# Patient Record
Sex: Female | Born: 1955 | Race: White | Hispanic: No | Smoking: Current every day smoker
Health system: Southern US, Community
[De-identification: ages and names within clinical notes are randomized; demographics above are authoritative.]

## PROBLEM LIST (undated history)

## (undated) DIAGNOSIS — C801 Malignant (primary) neoplasm, unspecified: Secondary | ICD-10-CM

## (undated) DIAGNOSIS — M86172 Other acute osteomyelitis, left ankle and foot: Secondary | ICD-10-CM

## (undated) DIAGNOSIS — M1990 Unspecified osteoarthritis, unspecified site: Secondary | ICD-10-CM

## (undated) DIAGNOSIS — R05 Cough: Secondary | ICD-10-CM

## (undated) DIAGNOSIS — Z87442 Personal history of urinary calculi: Secondary | ICD-10-CM

## (undated) DIAGNOSIS — R059 Cough, unspecified: Secondary | ICD-10-CM

## (undated) HISTORY — PX: BREAST SURGERY: SHX581

## (undated) HISTORY — PX: CHOLECYSTECTOMY: SHX55

---

## 2004-02-14 ENCOUNTER — Emergency Department (HOSPITAL_COMMUNITY): Admission: EM | Admit: 2004-02-14 | Discharge: 2004-02-15 | Payer: Self-pay | Admitting: Emergency Medicine

## 2004-03-31 ENCOUNTER — Emergency Department (HOSPITAL_COMMUNITY): Admission: EM | Admit: 2004-03-31 | Discharge: 2004-03-31 | Payer: Self-pay | Admitting: Emergency Medicine

## 2008-09-24 ENCOUNTER — Emergency Department (HOSPITAL_COMMUNITY): Admission: EM | Admit: 2008-09-24 | Discharge: 2008-09-24 | Payer: Self-pay | Admitting: Emergency Medicine

## 2009-03-13 ENCOUNTER — Emergency Department (HOSPITAL_COMMUNITY): Admission: EM | Admit: 2009-03-13 | Discharge: 2009-03-13 | Payer: Self-pay | Admitting: Emergency Medicine

## 2009-08-08 ENCOUNTER — Emergency Department (HOSPITAL_COMMUNITY): Admission: EM | Admit: 2009-08-08 | Discharge: 2009-08-09 | Payer: Self-pay | Admitting: Emergency Medicine

## 2010-04-25 ENCOUNTER — Emergency Department (HOSPITAL_COMMUNITY): Admission: EM | Admit: 2010-04-25 | Discharge: 2010-04-25 | Payer: Self-pay | Admitting: Emergency Medicine

## 2010-09-17 LAB — GLUCOSE, CAPILLARY: Glucose-Capillary: 339 mg/dL — ABNORMAL HIGH (ref 70–99)

## 2010-09-24 LAB — URINALYSIS, ROUTINE W REFLEX MICROSCOPIC
Bilirubin Urine: NEGATIVE
Glucose, UA: >1000 mg/dL — AB
Hgb urine dipstick: NEGATIVE
Ketones, ur: NEGATIVE mg/dL
Leukocytes, UA: NEGATIVE
Nitrite: NEGATIVE
Protein, ur: NEGATIVE mg/dL
Specific Gravity, Urine: 1.015 (ref 1.005–1.030)
Urobilinogen, UA: 0.2 mg/dL (ref 0.0–1.0)
pH: 5.5 (ref 5.0–8.0)

## 2010-09-24 LAB — URINE MICROSCOPIC-ADD ON

## 2010-09-24 LAB — GLUCOSE, CAPILLARY: Glucose-Capillary: 399 mg/dL — ABNORMAL HIGH (ref 70–99)

## 2010-10-16 LAB — GLUCOSE, CAPILLARY: Glucose-Capillary: 243 mg/dL — ABNORMAL HIGH (ref 70–99)

## 2011-01-06 ENCOUNTER — Emergency Department (HOSPITAL_COMMUNITY)
Admission: EM | Admit: 2011-01-06 | Discharge: 2011-01-06 | Disposition: A | Discharge status: Home / Self Care | Payer: Self-pay | Attending: Emergency Medicine | Admitting: Emergency Medicine

## 2011-01-06 DIAGNOSIS — IMO0002 Reserved for concepts with insufficient information to code with codable children: Secondary | ICD-10-CM | POA: Insufficient documentation

## 2011-01-06 DIAGNOSIS — T169XXA Foreign body in ear, unspecified ear, initial encounter: Secondary | ICD-10-CM | POA: Insufficient documentation

## 2011-01-06 DIAGNOSIS — E119 Type 2 diabetes mellitus without complications: Secondary | ICD-10-CM | POA: Insufficient documentation

## 2011-03-15 ENCOUNTER — Inpatient Hospital Stay (HOSPITAL_COMMUNITY)
Admission: EM | Admit: 2011-03-15 | Discharge: 2011-03-20 | DRG: 470 | Disposition: A | Discharge status: Home Health | Payer: Self-pay | Attending: Orthopaedic Surgery | Admitting: Orthopaedic Surgery

## 2011-03-15 ENCOUNTER — Other Ambulatory Visit: Payer: Self-pay

## 2011-03-15 ENCOUNTER — Emergency Department (HOSPITAL_COMMUNITY): Payer: Self-pay

## 2011-03-15 DIAGNOSIS — E119 Type 2 diabetes mellitus without complications: Secondary | ICD-10-CM | POA: Diagnosis present

## 2011-03-15 DIAGNOSIS — S72009A Fracture of unspecified part of neck of unspecified femur, initial encounter for closed fracture: Secondary | ICD-10-CM

## 2011-03-15 DIAGNOSIS — S72001A Fracture of unspecified part of neck of right femur, initial encounter for closed fracture: Secondary | ICD-10-CM | POA: Diagnosis present

## 2011-03-15 DIAGNOSIS — IMO0001 Reserved for inherently not codable concepts without codable children: Secondary | ICD-10-CM | POA: Diagnosis present

## 2011-03-15 DIAGNOSIS — W010XXA Fall on same level from slipping, tripping and stumbling without subsequent striking against object, initial encounter: Secondary | ICD-10-CM | POA: Diagnosis present

## 2011-03-15 DIAGNOSIS — S72033A Displaced midcervical fracture of unspecified femur, initial encounter for closed fracture: Principal | ICD-10-CM | POA: Diagnosis present

## 2011-03-15 LAB — GLUCOSE, CAPILLARY
Glucose-Capillary: 270 mg/dL — ABNORMAL HIGH (ref 70–99)
Glucose-Capillary: 294 mg/dL — ABNORMAL HIGH (ref 70–99)

## 2011-03-15 LAB — DIFFERENTIAL
Basophils Absolute: 0 10*3/uL (ref 0.0–0.1)
Basophils Relative: 0 % (ref 0–1)
Eosinophils Absolute: 0.1 10*3/uL (ref 0.0–0.7)
Eosinophils Relative: 1 % (ref 0–5)
Lymphocytes Relative: 22 % (ref 12–46)
Lymphs Abs: 2.5 10*3/uL (ref 0.7–4.0)
Monocytes Absolute: 0.5 10*3/uL (ref 0.1–1.0)
Monocytes Relative: 5 % (ref 3–12)
Neutro Abs: 8.2 10*3/uL — ABNORMAL HIGH (ref 1.7–7.7)
Neutrophils Relative %: 73 % (ref 43–77)

## 2011-03-15 LAB — CBC
HCT: 39.1 % (ref 36.0–46.0)
Hemoglobin: 13.6 g/dL (ref 12.0–15.0)
MCH: 29.8 pg (ref 26.0–34.0)
MCHC: 34.8 g/dL (ref 30.0–36.0)
MCV: 85.7 fL (ref 78.0–100.0)
Platelets: 149 10*3/uL — ABNORMAL LOW (ref 150–400)
RBC: 4.56 MIL/uL (ref 3.87–5.11)
RDW: 12.6 % (ref 11.5–15.5)
WBC: 11.2 10*3/uL — ABNORMAL HIGH (ref 4.0–10.5)

## 2011-03-15 LAB — BASIC METABOLIC PANEL
BUN: 17 mg/dL (ref 6–23)
CO2: 25 mEq/L (ref 19–32)
Calcium: 9.2 mg/dL (ref 8.4–10.5)
Chloride: 99 mEq/L (ref 96–112)
Creatinine, Ser: 0.48 mg/dL — ABNORMAL LOW (ref 0.50–1.10)
GFR calc Af Amer: >60 mL/min (ref >60)
GFR calc non Af Amer: >60 mL/min (ref >60)
Glucose, Bld: 280 mg/dL — ABNORMAL HIGH (ref 70–99)
Potassium: 3.4 mEq/L — ABNORMAL LOW (ref 3.5–5.1)
Sodium: 132 mEq/L — ABNORMAL LOW (ref 135–145)

## 2011-03-15 LAB — APTT: aPTT: 29 seconds (ref 24–37)

## 2011-03-15 LAB — PROTIME-INR
INR: 0.98 (ref 0.00–1.49)
Prothrombin Time: 13.2 seconds (ref 11.6–15.2)

## 2011-03-15 LAB — HEMOGLOBIN A1C
Hgb A1c MFr Bld: 10.4 % — ABNORMAL HIGH (ref <5.7)
Mean Plasma Glucose: 252 mg/dL — ABNORMAL HIGH (ref <117)

## 2011-03-15 MED ORDER — INSULIN ASPART 100 UNIT/ML ~~LOC~~ SOLN
0.0000 [IU] | Freq: Every day | SUBCUTANEOUS | Status: DC
  Administered 2011-03-15: 5 [IU] via SUBCUTANEOUS
  Administered 2011-03-17: 2 [IU] via SUBCUTANEOUS
  Administered 2011-03-18 – 2011-03-19 (×2): 3 [IU] via SUBCUTANEOUS

## 2011-03-15 MED ORDER — POTASSIUM CHLORIDE IN NACL 20-0.9 MEQ/L-% IV SOLN
INTRAVENOUS | Status: DC
  Administered 2011-03-15 – 2011-03-16 (×2): via INTRAVENOUS
  Administered 2011-03-17: 1000 mL via INTRAVENOUS
  Administered 2011-03-18: 16:00:00 via INTRAVENOUS

## 2011-03-15 MED ORDER — HYDROMORPHONE HCL 1 MG/ML IJ SOLN: 0.5000 mg | INTRAMUSCULAR | Status: DC | PRN

## 2011-03-15 MED ORDER — DIPHENHYDRAMINE HCL 12.5 MG/5ML PO ELIX
12.5000 mg | ORAL_SOLUTION | Freq: Four times a day (QID) | ORAL | Status: DC | PRN
  Administered 2011-03-17: 12.5 mg via ORAL
  Filled 2011-03-15: qty 5

## 2011-03-15 MED ORDER — DEXTROSE-NACL 5-0.45 % IV SOLN
INTRAVENOUS | Status: DC
  Administered 2011-03-15: 16:00:00 via INTRAVENOUS

## 2011-03-15 MED ORDER — HYDROMORPHONE 0.3 MG/ML IV SOLN
INTRAVENOUS | Status: DC
  Administered 2011-03-15 – 2011-03-16 (×2): via INTRAVENOUS
  Administered 2011-03-17: 1.97 mg via INTRAVENOUS
  Administered 2011-03-17: 3.3 mg via INTRAVENOUS
  Administered 2011-03-18: 12:00:00 via INTRAVENOUS
  Administered 2011-03-19: 0.9 mg via INTRAVENOUS
  Administered 2011-03-19: 1.2 mg via INTRAVENOUS
  Administered 2011-03-19: 1.5 mg via INTRAVENOUS

## 2011-03-15 MED ORDER — PNEUMOCOCCAL VAC POLYVALENT 25 MCG/0.5ML IJ INJ
0.5000 mL | INJECTION | INTRAMUSCULAR | Status: AC
  Administered 2011-03-16: 0.5 mL via INTRAMUSCULAR
  Filled 2011-03-15: qty 0.5

## 2011-03-15 MED ORDER — DIPHENHYDRAMINE HCL 50 MG/ML IJ SOLN
12.5000 mg | Freq: Four times a day (QID) | INTRAMUSCULAR | Status: DC | PRN
  Administered 2011-03-18: 12.5 mg via INTRAVENOUS
  Filled 2011-03-15: qty 1

## 2011-03-15 MED ORDER — HYDROMORPHONE 0.3 MG/ML IV SOLN
INTRAVENOUS | Status: AC
  Filled 2011-03-15: qty 25

## 2011-03-15 MED ORDER — ONDANSETRON HCL 4 MG/2ML IJ SOLN
4.0000 mg | Freq: Four times a day (QID) | INTRAMUSCULAR | Status: DC | PRN
  Administered 2011-03-16 – 2011-03-17 (×2): 4 mg via INTRAVENOUS
  Filled 2011-03-15: qty 2

## 2011-03-15 MED ORDER — CEFAZOLIN SODIUM 1-5 GM-% IV SOLN
1.0000 g | INTRAVENOUS | Status: DC
  Filled 2011-03-15: qty 50

## 2011-03-15 MED ORDER — GLIPIZIDE 5 MG PO TABS
5.0000 mg | ORAL_TABLET | Freq: Every day | ORAL | Status: DC
  Filled 2011-03-15: qty 1

## 2011-03-15 MED ORDER — NALOXONE HCL 0.4 MG/ML IJ SOLN: 0.4000 mg | INTRAMUSCULAR | Status: DC | PRN

## 2011-03-15 MED ORDER — SODIUM CHLORIDE 0.9 % IJ SOLN: 9.0000 mL | INTRAMUSCULAR | Status: DC | PRN

## 2011-03-15 MED ORDER — ZOLPIDEM TARTRATE 5 MG PO TABS
5.0000 mg | ORAL_TABLET | Freq: Every day | ORAL | Status: DC
  Administered 2011-03-16 – 2011-03-19 (×3): 5 mg via ORAL
  Filled 2011-03-15 (×4): qty 1

## 2011-03-15 MED ORDER — FENTANYL CITRATE 0.05 MG/ML IJ SOLN
INTRAMUSCULAR | Status: AC
  Administered 2011-03-15: 50 ug
  Filled 2011-03-15: qty 2

## 2011-03-15 MED ORDER — INSULIN ASPART 100 UNIT/ML ~~LOC~~ SOLN
0.0000 [IU] | Freq: Three times a day (TID) | SUBCUTANEOUS | Status: DC
  Administered 2011-03-16: 8 [IU] via SUBCUTANEOUS
  Administered 2011-03-16: 11 [IU] via SUBCUTANEOUS
  Administered 2011-03-17: 5 [IU] via SUBCUTANEOUS
  Administered 2011-03-17: 8 [IU] via SUBCUTANEOUS
  Administered 2011-03-17 – 2011-03-18 (×2): 5 [IU] via SUBCUTANEOUS
  Administered 2011-03-18: 8 [IU] via SUBCUTANEOUS
  Administered 2011-03-18: 11 [IU] via SUBCUTANEOUS
  Administered 2011-03-19: 5 [IU] via SUBCUTANEOUS
  Administered 2011-03-19: 11 [IU] via SUBCUTANEOUS
  Administered 2011-03-19: 8 [IU] via SUBCUTANEOUS
  Administered 2011-03-20: 5 [IU] via SUBCUTANEOUS
  Administered 2011-03-20: 3 [IU] via SUBCUTANEOUS
  Filled 2011-03-15 (×2): qty 3

## 2011-03-15 MED ORDER — POVIDONE-IODINE 10 % EX SOLN
Freq: Once | CUTANEOUS | Status: AC
  Administered 2011-03-15: 22:00:00 via TOPICAL
  Filled 2011-03-15: qty 711

## 2011-03-15 MED ORDER — HYDROMORPHONE HCL 1 MG/ML IJ SOLN
INTRAMUSCULAR | Status: AC
  Administered 2011-03-15: 1 mg
  Filled 2011-03-15: qty 1

## 2011-03-15 NOTE — H&P (Signed)
Brandy Fisher is an 55 y.o. female.   Chief Complaint: I fell and hurt my right hip. HPI: She fell while getting into a car today in order to go to church this morning.  She hurt her right hip.  She could not stand.  She had severe pain.  X-rays in the ER show a displaced subcapital fracture of the right femur.  She has no other injury.  She has a history of diabetes, oral control but not taking anything now for the diabetes.  She does not have a family doctor now.  She lost her Medicaid and has not seen a physician for her diabetes control.  Past Medical History  Diagnosis Date  . Diabetes mellitus     Past Surgical History  Procedure Date  . Cesarean section   . Breast surgery     Left breast biopsy, negative    Family History  Problem Relation Age of Onset  . Diabetes Mother    Social History:  reports that she has been smoking Cigarettes.  She has a 25 pack-year smoking history. She does not have any smokeless tobacco history on file. She reports that she does not drink alcohol or use illicit drugs.  Allergies:  Allergies  Allergen Reactions  . Morphine And Related     Medications Prior to Admission  Medication Dose Route Frequency Provider Last Rate Last Dose  . fentaNYL (SUBLIMAZE) 0.05 MG/ML injection        50 mcg at 03/15/11 1028  . HYDROmorphone (DILAUDID) 1 MG/ML injection        1 mg at 03/15/11 1110   No current outpatient prescriptions on file as of 03/15/2011.    Results for orders placed during the hospital encounter of 03/15/11 (from the past 48 hour(s))  CBC     Status: Abnormal   Collection Time   03/15/11 11:05 AM      Component Value Range Comment   WBC 11.2 (*) 4.0 - 10.5 (K/uL)    RBC 4.56  3.87 - 5.11 (MIL/uL)    Hemoglobin 13.6  12.0 - 15.0 (g/dL)    HCT 16.1  09.6 - 04.5 (%)    MCV 85.7  78.0 - 100.0 (fL)    MCH 29.8  26.0 - 34.0 (pg)    MCHC 34.8  30.0 - 36.0 (g/dL)    RDW 40.9  81.1 - 91.4 (%)    Platelets 149 (*) 150 - 400 (K/uL)     DIFFERENTIAL     Status: Abnormal   Collection Time   03/15/11 11:05 AM      Component Value Range Comment   Neutrophils Relative 73  43 - 77 (%)    Neutro Abs 8.2 (*) 1.7 - 7.7 (K/uL)    Lymphocytes Relative 22  12 - 46 (%)    Lymphs Abs 2.5  0.7 - 4.0 (K/uL)    Monocytes Relative 5  3 - 12 (%)    Monocytes Absolute 0.5  0.1 - 1.0 (K/uL)    Eosinophils Relative 1  0 - 5 (%)    Eosinophils Absolute 0.1  0.0 - 0.7 (K/uL)    Basophils Relative 0  0 - 1 (%)    Basophils Absolute 0.0  0.0 - 0.1 (K/uL)   BASIC METABOLIC PANEL     Status: Abnormal   Collection Time   03/15/11 11:05 AM      Component Value Range Comment   Sodium 132 (*) 135 - 145 (mEq/L)  Potassium 3.4 (*) 3.5 - 5.1 (mEq/L)    Chloride 99  96 - 112 (mEq/L)    CO2 25  19 - 32 (mEq/L)    Glucose, Bld 280 (*) 70 - 99 (mg/dL)    BUN 17  6 - 23 (mg/dL)    Creatinine, Ser 1.61 (*) 0.50 - 1.10 (mg/dL)    Calcium 9.2  8.4 - 10.5 (mg/dL)    GFR calc non Af Amer >60  >60 (mL/min)    GFR calc Af Amer >60  >60 (mL/min)   PROTIME-INR     Status: Normal   Collection Time   03/15/11 11:05 AM      Component Value Range Comment   Prothrombin Time 13.2  11.6 - 15.2 (seconds)    INR 0.98  0.00 - 1.49    APTT     Status: Normal   Collection Time   03/15/11 11:05 AM      Component Value Range Comment   aPTT 29  24 - 37 (seconds)   TYPE AND SCREEN     Status: Normal   Collection Time   03/15/11 11:10 AM      Component Value Range Comment   ABO/RH(D) A NEG      Antibody Screen NEG      Sample Expiration 03/18/2011      Dg Chest 1 View  03/15/2011  *RADIOLOGY REPORT*  Clinical Data: Preoperative respiratory exam for hip fracture  CHEST - 1 VIEW  Comparison: None.  Findings: Lungs are clear.  No edema or infiltrate.  No pneumothorax.  No pleural fluid.  Heart size is normal.  Visualized bony thorax is unremarkable.  IMPRESSION: No active disease.  Original Report Authenticated By: Reola Calkins, M.D.   Dg Hip Complete  Right  03/15/2011  *RADIOLOGY REPORT*  Clinical Data: Fall with right hip pain.  RIGHT HIP - COMPLETE 2+ VIEW  Comparison: None.  Findings: Complete subcapital hip fracture present with angulation and impaction.  No dislocation.  The rest of the bony pelvis is intact.  Moderate degenerative changes are seen in the visualized left hip joint.  IMPRESSION: Acute right subcapital hip fracture.  Original Report Authenticated By: Reola Calkins, M.D.    Review of Systems  Constitutional: Negative for fever, chills, weight loss, malaise/fatigue and diaphoresis.  HENT: Negative.   Eyes: Negative.   Respiratory: Negative.   Cardiovascular: Negative.   Gastrointestinal: Negative.   Genitourinary: Negative.   Musculoskeletal: Positive for joint pain (right hip pain with the fall today.  She has mild arthritis multiple joints but does not take anything for it.) and falls (today.).  Skin: Negative.   Neurological: Negative.  Negative for weakness.  Endo/Heme/Allergies: Positive for polydipsia (she is positive for diabetes oral control (poor control).).  Psychiatric/Behavioral: Negative.     Blood pressure 143/71, pulse 88, temperature 97.8 F (36.6 C), temperature source Oral, resp. rate 20, height 5\' 7"  (1.702 m), weight 83.915 kg (185 lb), SpO2 93.00%. Physical Exam  Constitutional: She is oriented to person, place, and time. She appears well-developed and well-nourished.  HENT:  Head: Normocephalic.  Eyes: Conjunctivae and EOM are normal. Pupils are equal, round, and reactive to light.  Neck: Normal range of motion. Neck supple.  Cardiovascular: Normal rate, regular rhythm, normal heart sounds and intact distal pulses.   Respiratory: Effort normal and breath sounds normal.  GI: Soft. Bowel sounds are normal.  Musculoskeletal: Tenderness: Pain right hip to any motion.  She is shortened and externally rotated  of the right lower leg.  Neurovascular is intact.       Right hip: She exhibits  decreased range of motion, tenderness, bony tenderness and crepitus.       Legs: Neurological: She is alert and oriented to person, place, and time. She has normal reflexes.  Skin: Skin is warm and dry.  Psychiatric: She has a normal mood and affect. Her behavior is normal. Judgment and thought content normal.     Assessment/Plan Impression:  Acute subcapital fracture of the right hip.  Other problems:  1.  Diabetes mellitus, poorly controlled; 2.  No family physician   Plan:  1.  Inpatient admission.  2.  Have hospitalist consult and evaluate pre-op for possible surgery tomorrow (Monday) and evaluate and manage diabetes during hospital stay.  I have discussed with the patient the findings and need for surgery of the right hip.  Risks and imponderables were discussed in detail including infection, blood clots>pulmonary embolism, possible need for blood transfusion, need for PT/OT, need for a walker, possible nursing home placement and anesthesia risks (spinal preferred) among others.  She asked appropriate questions.  She appears to understand.   Kerby Hockley 03/15/2011, 12:39 PM

## 2011-03-15 NOTE — ED Notes (Signed)
Resting quietly in bed. Family at bedside. No change in pt condition. Continues to await admission.

## 2011-03-15 NOTE — ED Notes (Signed)
Called to give report to nurse on floor. Was told she was unavailable. Awaiting return call.

## 2011-03-15 NOTE — ED Notes (Signed)
Called floor again to give report. Was told remains on lunch. Awaiting return call

## 2011-03-15 NOTE — ED Notes (Signed)
Resting quietly in bed. Reports improvement in pain after dilaudid. Resp even and unlabored. Foley in place. Orthopedic md at bedside.

## 2011-03-15 NOTE — ED Provider Notes (Addendum)
Scribed for [employer]. Bernette Mayers, MD, the patient was seen in room APA18/APA18. This chart was scribed by [employer]. The patient's care started at 10:18  CSN: [id number] Arrival date & time: 03/15/2011 10:20 AM  Chief Complaint  Patient presents with  . Fall   HPI Brandy Fisher is a 55 y.o. female with a history of diabetes mellitus who presents to the Emergency Department complaining of fall. States she was getting into her car this a.m when she lost her balance and fell. Reports pain in her right hip radiating into her leg and knee. Denies any loss of consciousness, head trauma or neck pain.   Past Medical History  Diagnosis Date  . Diabetes mellitus     Past Surgical History  Procedure Date  . Cesarean section     No family history on file.  History  Substance Use Topics  . Smoking status: Current Everyday Smoker  . Smokeless tobacco: Not on file  . Alcohol Use: No    OB History    Grav Para Term Preterm Abortions TAB SAB Ect Mult Living                  Review of Systems  HENT: Negative for neck pain.   Neurological: Negative for headaches.  All other systems reviewed and are negative.    Physical Exam  BP 166/90  Pulse 88  Temp(Src) 97.8 F (36.6 C) (Oral)  Resp 20  Ht 5\' 7"  (1.702 m)  Wt 185 lb (83.915 kg)  BMI 28.97 kg/m2  SpO2 96%  Physical Exam  Nursing note and vitals reviewed. Constitutional: She is oriented to person, place, and time. She appears well-developed and well-nourished.  HENT:  Head: Normocephalic and atraumatic.  Mouth/Throat: No oropharyngeal exudate.  Eyes: Conjunctivae and EOM are normal. Pupils are equal, round, and reactive to light.  Neck: Normal range of motion. Neck supple. No tracheal deviation present.  Cardiovascular: Normal rate, regular rhythm, normal heart sounds and intact distal pulses.   No murmur heard. Pulmonary/Chest: Effort normal and breath sounds normal. No respiratory distress. She has no wheezes. She  has no rales.  Abdominal: Soft. Bowel sounds are normal. There is no tenderness. There is no rebound and no guarding.  Musculoskeletal: She exhibits tenderness. She exhibits no edema.       No tenderness along the C or L-spine; R leg is shortened and externally rotated, pain with ROM, tender over the R hip  Lymphadenopathy:    She has no cervical adenopathy.  Neurological: She is alert and oriented to person, place, and time. No cranial nerve deficit.  Skin: Skin is warm and dry. No rash noted. She is not diaphoretic. No erythema.  Psychiatric: She has a normal mood and affect. Her behavior is normal.    ED Course  Procedures  OTHER DATA REVIEWED: Nursing notes, vital signs, and past medical records reviewed.    DIAGNOSTIC STUDIES: Oxygen Saturation is 96% on room air, normal by my interpretation.    LABS / RADIOLOGY: Xray shows hip fracture.   Results for orders placed during the hospital encounter of 03/15/11  CBC      Component Value Range   WBC 11.2 (*) 4.0 - 10.5 (K/uL)   RBC 4.56  3.87 - 5.11 (MIL/uL)   Hemoglobin 13.6  12.0 - 15.0 (g/dL)   HCT 40.9  81.1 - 91.4 (%)   MCV 85.7  78.0 - 100.0 (fL)   MCH 29.8  26.0 - 34.0 (pg)  MCHC 34.8  30.0 - 36.0 (g/dL)   RDW 87.5  64.3 - 32.9 (%)   Platelets 149 (*) 150 - 400 (K/uL)  DIFFERENTIAL      Component Value Range   Neutrophils Relative 73  43 - 77 (%)   Neutro Abs 8.2 (*) 1.7 - 7.7 (K/uL)   Lymphocytes Relative 22  12 - 46 (%)   Lymphs Abs 2.5  0.7 - 4.0 (K/uL)   Monocytes Relative 5  3 - 12 (%)   Monocytes Absolute 0.5  0.1 - 1.0 (K/uL)   Eosinophils Relative 1  0 - 5 (%)   Eosinophils Absolute 0.1  0.0 - 0.7 (K/uL)   Basophils Relative 0  0 - 1 (%)   Basophils Absolute 0.0  0.0 - 0.1 (K/uL)  PROTIME-INR      Component Value Range   Prothrombin Time 13.2  11.6 - 15.2 (seconds)   INR 0.98  0.00 - 1.49   APTT      Component Value Range   aPTT 29  24 - 37 (seconds)     Dg Chest 1 View  03/15/2011  *RADIOLOGY  REPORT*  Clinical Data: Preoperative respiratory exam for hip fracture  CHEST - 1 VIEW  Comparison: None.  Findings: Lungs are clear.  No edema or infiltrate.  No pneumothorax.  No pleural fluid.  Heart size is normal.  Visualized bony thorax is unremarkable.  IMPRESSION: No active disease.  Original Report Authenticated By: Reola Calkins, M.D.   Dg Hip Complete Right  03/15/2011  *RADIOLOGY REPORT*  Clinical Data: Fall with right hip pain.  RIGHT HIP - COMPLETE 2+ VIEW  Comparison: None.  Findings: Complete subcapital hip fracture present with angulation and impaction.  No dislocation.  The rest of the bony pelvis is intact.  Moderate degenerative changes are seen in the visualized left hip joint.  IMPRESSION: Acute right subcapital hip fracture.  Original Report Authenticated By: Reola Calkins, M.D.    ED COURSE / COORDINATION OF CARE: 10:18 - EDMD examined patient and ordered to following Orders Placed This Encounter  Procedures  . DG Chest 1 View  . DG Hip Complete Right  . CBC  . Differential  . Basic metabolic panel  . Protime-INR  . APTT  . ED EKG  . Type and screen     MDM: Admit to Dr.Keeling for repair of hip fracture. Given dilaudid with good pain control.   IMPRESSION: Hip Fracture    MEDICATIONS GIVEN IN THE E.D.  Medications  fentaNYL (SUBLIMAZE) 0.05 MG/ML injection (not administered)     SCRIBE ATTESTATION: I personally performed the services described in the documentation, which were scribed in my presence. The recorded information has been reviewed and considered.   Kalee Mcclenathan B.       Onur Mori B. Bernette Mayers, MD 03/15/11 1154   Date: 03/15/2011  Rate: 79  Rhythm: normal sinus rhythm  QRS Axis: normal  Intervals: normal  ST/T Wave abnormalities: normal  Conduction Disutrbances:none  Narrative Interpretation:   Old EKG Reviewed: none available    Pearla Mckinny B. Bernette Mayers, MD 03/15/11 1154

## 2011-03-15 NOTE — Consult Note (Signed)
Brandy Fisher MRN: [id number] DOB/AGE: 1956/04/30 55 y.o. Referring Physician:Dr Darreld Mclean Consult date: 03/15/2011 Chief Complaint:  Management of diabetes, uncontrolled. HPI: This 55 year old lady has been admitted to the hospital because she fell and fractured her right hip. Dr. Hilda Lias has requested consultation for management of the patient's uncontrolled diabetes. The patient was diagnosed with diabetes approximately 9 years ago. Unfortunately, because of lack of insurance, she has not followed up with a primary care physician regarding her diabetes. She is on no medications. However, she does check her sugars, she says, twice a day. The sugars are variable. She denies any significant polyuria,, polydipsia, nonaccidental weight loss. She has never been hospitalized with uncontrolled diabetes. She does not know her cholesterol. She has never had coronary artery disease or cerebrovascular disease. She has no history of peripheral vascular disease. Unfortunately, she is a smoker. The patient was admitted today and her sugars are almost 300.    Past medical history: 1. Type II non-insulin-dependent diabetes mellitus.  Past surgical history: 1. Cesarean section. 2. Left breast biopsy, negative.     Family History  Problem Relation Age of Onset  . Diabetes Mother    Family history: Positive for diabetes.  Social history: She is a widower, having lost her husband a few years ago. She smokes approximately 2 packs of cigarettes per month. She lives with her daughter and family. She does not drink alcohol. She is unemployed.  Allergies:  Allergies  Allergen Reactions  . Morphine And Related Nausea And Vomiting         WUJ:WJXBJ from the symptoms mentioned above,there are no other symptoms referable to all systems reviewed.  Physical Exam: Blood pressure 119/71, pulse 77, temperature 97.8 F (36.6 C), temperature source Oral, resp. rate 16, height 5\' 7"  (1.702 m), weight 87  kg (191 lb 12.8 oz), SpO2 96.00%. She looks systemically well. Heart sounds are present and normal without murmurs. Lung fields are clear. There are no carotid bruits. There are no focal neurological signs. She has evidence of right hip fracture clinically.   Basename 03/15/11 1105  WBC 11.2*  NEUTROABS 8.2*  HGB 13.6  HCT 39.1  MCV 85.7  PLT 149*    Basename 03/15/11 1105  NA 132*  K 3.4*  CL 99  CO2 25  GLUCOSE 280*  BUN 17  CREATININE 0.48*  CALCIUM 9.2  MG --  PHOS --     Dg Chest 1 View  03/15/2011  *RADIOLOGY REPORT*  Clinical Data: Preoperative respiratory exam for hip fracture  CHEST - 1 VIEW  Comparison: None.  Findings: Lungs are clear.  No edema or infiltrate.  No pneumothorax.  No pleural fluid.  Heart size is normal.  Visualized bony thorax is unremarkable.  IMPRESSION: No active disease.  Original Report Authenticated By: Reola Calkins, M.D.   Dg Hip Complete Right  03/15/2011  *RADIOLOGY REPORT*  Clinical Data: Fall with right hip pain.  RIGHT HIP - COMPLETE 2+ VIEW  Comparison: None.  Findings: Complete subcapital hip fracture present with angulation and impaction.  No dislocation.  The rest of the bony pelvis is intact.  Moderate degenerative changes are seen in the visualized left hip joint.  IMPRESSION: Acute right subcapital hip fracture.  Original Report Authenticated By: Reola Calkins, M.D.   Impression: 1. Uncontrolled type 2 diabetes mellitus. 2. Right subcapital hip fracture.     Plan: 1. Start oral hypoglycemic agents. 2. Sliding scale of insulin. 3. Lipid panel. 4. ECG. I will  follow with you during hospitalization.      Xuan Mateus C 03/15/2011, 5:22 PM

## 2011-03-15 NOTE — ED Notes (Signed)
Pt says was getting into car this am and lost her balance and fell.  Denies any LOC, denies any dizziness or weakness.  Pt c/o R hip pain.    Pedal pulse present, extremity warm to touch  Pt able to wiggle toes.

## 2011-03-16 ENCOUNTER — Other Ambulatory Visit: Payer: Self-pay | Admitting: Orthopaedic Surgery

## 2011-03-16 ENCOUNTER — Encounter (HOSPITAL_COMMUNITY): Payer: Self-pay | Admitting: Anesthesiology

## 2011-03-16 ENCOUNTER — Encounter (HOSPITAL_COMMUNITY): Payer: Self-pay | Admitting: *Deleted

## 2011-03-16 ENCOUNTER — Inpatient Hospital Stay (HOSPITAL_COMMUNITY): Payer: Self-pay | Admitting: Anesthesiology

## 2011-03-16 ENCOUNTER — Inpatient Hospital Stay (HOSPITAL_COMMUNITY): Payer: Self-pay

## 2011-03-16 ENCOUNTER — Encounter (HOSPITAL_COMMUNITY)
Admission: EM | Disposition: A | Discharge status: Home Health | Payer: Self-pay | Source: Home / Self Care | Attending: Orthopaedic Surgery

## 2011-03-16 HISTORY — PX: HIP ARTHROPLASTY: SHX981

## 2011-03-16 LAB — GLUCOSE, CAPILLARY
Glucose-Capillary: 269 mg/dL — ABNORMAL HIGH (ref 70–99)
Glucose-Capillary: 302 mg/dL — ABNORMAL HIGH (ref 70–99)
Glucose-Capillary: 297 mg/dL — ABNORMAL HIGH (ref 70–99)
Glucose-Capillary: 345 mg/dL — ABNORMAL HIGH (ref 70–99)
Glucose-Capillary: 298 mg/dL — ABNORMAL HIGH (ref 70–99)

## 2011-03-16 LAB — CBC
HCT: 38.6 % (ref 36.0–46.0)
Hemoglobin: 13.5 g/dL (ref 12.0–15.0)
MCH: 30.2 pg (ref 26.0–34.0)
MCHC: 35 g/dL (ref 30.0–36.0)
MCV: 86.4 fL (ref 78.0–100.0)
Platelets: 142 10*3/uL — ABNORMAL LOW (ref 150–400)
RBC: 4.47 MIL/uL (ref 3.87–5.11)
RDW: 12.5 % (ref 11.5–15.5)
WBC: 11.9 10*3/uL — ABNORMAL HIGH (ref 4.0–10.5)

## 2011-03-16 LAB — DIFFERENTIAL
Basophils Absolute: 0 10*3/uL (ref 0.0–0.1)
Basophils Relative: 0 % (ref 0–1)
Eosinophils Absolute: 0.1 10*3/uL (ref 0.0–0.7)
Eosinophils Relative: 1 % (ref 0–5)
Lymphocytes Relative: 26 % (ref 12–46)
Lymphs Abs: 3.1 10*3/uL (ref 0.7–4.0)
Monocytes Absolute: 0.7 10*3/uL (ref 0.1–1.0)
Monocytes Relative: 6 % (ref 3–12)
Neutro Abs: 8 10*3/uL — ABNORMAL HIGH (ref 1.7–7.7)
Neutrophils Relative %: 67 % (ref 43–77)

## 2011-03-16 LAB — PREPARE RBC (CROSSMATCH)

## 2011-03-16 LAB — LIPID PANEL
Cholesterol: 136 mg/dL (ref 0–200)
HDL: 31 mg/dL — ABNORMAL LOW (ref >39)
LDL Cholesterol: 60 mg/dL (ref 0–99)
Total CHOL/HDL Ratio: 4.4 RATIO
Triglycerides: 224 mg/dL — ABNORMAL HIGH (ref <150)
VLDL: 45 mg/dL — ABNORMAL HIGH (ref 0–40)

## 2011-03-16 LAB — BASIC METABOLIC PANEL
BUN: 10 mg/dL (ref 6–23)
CO2: 29 mEq/L (ref 19–32)
Calcium: 9 mg/dL (ref 8.4–10.5)
Chloride: 98 mEq/L (ref 96–112)
Creatinine, Ser: 0.49 mg/dL — ABNORMAL LOW (ref 0.50–1.10)
GFR calc Af Amer: >60 mL/min (ref >60)
GFR calc non Af Amer: >60 mL/min (ref >60)
Glucose, Bld: 273 mg/dL — ABNORMAL HIGH (ref 70–99)
Potassium: 3.4 mEq/L — ABNORMAL LOW (ref 3.5–5.1)
Sodium: 134 mEq/L — ABNORMAL LOW (ref 135–145)

## 2011-03-16 LAB — ABO/RH: ABO/RH(D): A NEG

## 2011-03-16 SURGERY — HEMIARTHROPLASTY, HIP, DIRECT ANTERIOR APPROACH, FOR FRACTURE
Anesthesia: Spinal | Laterality: Right | Wound class: Clean

## 2011-03-16 MED ORDER — ACETAMINOPHEN 325 MG PO TABS: 650.0000 mg | ORAL_TABLET | Freq: Four times a day (QID) | ORAL | Status: DC | PRN

## 2011-03-16 MED ORDER — SODIUM CHLORIDE 0.9 % IR SOLN
Status: DC | PRN
  Administered 2011-03-16: 1000 mL

## 2011-03-16 MED ORDER — HYDROGEN PEROXIDE 3 % EX SOLN
CUTANEOUS | Status: DC | PRN
  Administered 2011-03-16: 1

## 2011-03-16 MED ORDER — LIDOCAINE HCL (PF) 1 % IJ SOLN
INTRAMUSCULAR | Status: AC
  Filled 2011-03-16: qty 5

## 2011-03-16 MED ORDER — INSULIN ASPART 100 UNIT/ML ~~LOC~~ SOLN
6.0000 [IU] | Freq: Once | SUBCUTANEOUS | Status: AC
  Administered 2011-03-16: 6 [IU] via SUBCUTANEOUS

## 2011-03-16 MED ORDER — EPHEDRINE SULFATE 50 MG/ML IJ SOLN
INTRAMUSCULAR | Status: DC | PRN
  Administered 2011-03-16 (×3): 10 mg via INTRAVENOUS
  Administered 2011-03-16: 5 mg via INTRAVENOUS
  Administered 2011-03-16: 15 mg via INTRAVENOUS

## 2011-03-16 MED ORDER — GLIPIZIDE 5 MG PO TABS
5.0000 mg | ORAL_TABLET | Freq: Two times a day (BID) | ORAL | Status: DC
  Administered 2011-03-16 – 2011-03-17 (×2): 5 mg via ORAL
  Filled 2011-03-16 (×2): qty 1

## 2011-03-16 MED ORDER — HYDROMORPHONE 0.3 MG/ML IV SOLN
INTRAVENOUS | Status: AC
  Filled 2011-03-16: qty 25

## 2011-03-16 MED ORDER — MIDAZOLAM HCL 2 MG/2ML IJ SOLN
INTRAMUSCULAR | Status: AC
  Filled 2011-03-16: qty 2

## 2011-03-16 MED ORDER — MAGNESIUM HYDROXIDE 400 MG/5ML PO SUSP: 30.0000 mL | Freq: Every day | ORAL | Status: DC | PRN

## 2011-03-16 MED ORDER — PROPOFOL 10 MG/ML IV EMUL
INTRAVENOUS | Status: DC | PRN
  Administered 2011-03-16: 25 ug/kg/min via INTRAVENOUS

## 2011-03-16 MED ORDER — ONDANSETRON HCL 4 MG/2ML IJ SOLN: 4.0000 mg | Freq: Once | INTRAMUSCULAR | Status: AC | PRN

## 2011-03-16 MED ORDER — PROPOFOL 10 MG/ML IV EMUL
INTRAVENOUS | Status: AC
  Filled 2011-03-16: qty 20

## 2011-03-16 MED ORDER — EPHEDRINE SULFATE 50 MG/ML IJ SOLN
INTRAMUSCULAR | Status: AC
  Filled 2011-03-16: qty 1

## 2011-03-16 MED ORDER — ALBUTEROL SULFATE (2.5 MG/3ML) 0.083% IN NEBU
2.5000 mg | INHALATION_SOLUTION | Freq: Four times a day (QID) | RESPIRATORY_TRACT | Status: DC
  Administered 2011-03-16 – 2011-03-19 (×10): 2.5 mg via RESPIRATORY_TRACT
  Filled 2011-03-16 (×11): qty 0.5

## 2011-03-16 MED ORDER — ALBUTEROL SULFATE (5 MG/ML) 0.5% IN NEBU: 2.5000 mg | INHALATION_SOLUTION | RESPIRATORY_TRACT | Status: DC | PRN

## 2011-03-16 MED ORDER — FENTANYL CITRATE 0.05 MG/ML IJ SOLN
INTRAMUSCULAR | Status: DC | PRN
  Administered 2011-03-16: 25 ug via INTRATHECAL

## 2011-03-16 MED ORDER — BUPIVACAINE IN DEXTROSE 0.75-8.25 % IT SOLN
INTRATHECAL | Status: DC | PRN
  Administered 2011-03-16: 15 mg via INTRATHECAL

## 2011-03-16 MED ORDER — CEFAZOLIN SODIUM 1-5 GM-% IV SOLN
INTRAVENOUS | Status: DC | PRN
  Administered 2011-03-16: 1 g via INTRAVENOUS

## 2011-03-16 MED ORDER — FENTANYL CITRATE 0.05 MG/ML IJ SOLN
INTRAMUSCULAR | Status: AC
  Filled 2011-03-16: qty 2

## 2011-03-16 MED ORDER — FENTANYL CITRATE 0.05 MG/ML IJ SOLN
25.0000 ug | INTRAMUSCULAR | Status: DC | PRN
  Administered 2011-03-19: 50 ug via INTRAVENOUS
  Filled 2011-03-16: qty 2

## 2011-03-16 MED ORDER — ONDANSETRON HCL 4 MG/2ML IJ SOLN
INTRAMUSCULAR | Status: AC
  Administered 2011-03-16: 4 mg via INTRAVENOUS
  Filled 2011-03-16: qty 2

## 2011-03-16 MED ORDER — BUPIVACAINE IN DEXTROSE 0.75-8.25 % IT SOLN
INTRATHECAL | Status: AC
  Filled 2011-03-16: qty 2

## 2011-03-16 MED ORDER — FENTANYL CITRATE 0.05 MG/ML IJ SOLN
INTRAMUSCULAR | Status: DC | PRN
  Administered 2011-03-16: 75 ug via INTRAVENOUS

## 2011-03-16 MED ORDER — RIVAROXABAN 10 MG PO TABS
10.0000 mg | ORAL_TABLET | Freq: Every day | ORAL | Status: DC
  Administered 2011-03-17 – 2011-03-20 (×4): 10 mg via ORAL
  Filled 2011-03-16 (×4): qty 1

## 2011-03-16 MED ORDER — LACTATED RINGERS IV SOLN
INTRAVENOUS | Status: DC
  Administered 2011-03-16: 150 mL/h via INTRAVENOUS

## 2011-03-16 MED ORDER — PROMETHAZINE HCL 25 MG/ML IJ SOLN: 12.5000 mg | INTRAMUSCULAR | Status: DC | PRN

## 2011-03-16 MED ORDER — MIDAZOLAM HCL 2 MG/2ML IJ SOLN
1.0000 mg | INTRAMUSCULAR | Status: DC | PRN
  Administered 2011-03-16: 2 mg via INTRAVENOUS

## 2011-03-16 MED ORDER — ZOLPIDEM TARTRATE 5 MG PO TABS
5.0000 mg | ORAL_TABLET | Freq: Every day | ORAL | Status: DC
  Administered 2011-03-16 – 2011-03-17 (×2): 5 mg via ORAL
  Filled 2011-03-16: qty 1

## 2011-03-16 MED ORDER — LACTATED RINGERS IV SOLN
INTRAVENOUS | Status: DC | PRN
  Administered 2011-03-16 (×2): via INTRAVENOUS

## 2011-03-16 MED ORDER — FENTANYL CITRATE 0.05 MG/ML IJ SOLN
INTRAMUSCULAR | Status: AC
  Filled 2011-03-16: qty 5

## 2011-03-16 MED ORDER — INSULIN REGULAR HUMAN 100 UNIT/ML IJ SOLN: 6.0000 [IU] | Freq: Once | INTRAMUSCULAR | Status: DC

## 2011-03-16 MED ORDER — MIDAZOLAM HCL 5 MG/5ML IJ SOLN
INTRAMUSCULAR | Status: DC | PRN
  Administered 2011-03-16: 2 mg via INTRAVENOUS

## 2011-03-16 SURGICAL SUPPLY — 52 items
BAG HAMPER (MISCELLANEOUS) ×2 IMPLANT
BLADE SAGITTAL 25.0X1.27X90 (BLADE) ×2 IMPLANT
BLADE SURG SZ20 CARB STEEL (BLADE) ×2 IMPLANT
BNDG COHESIVE 4X5 TAN NS LF (GAUZE/BANDAGES/DRESSINGS) ×2 IMPLANT
CLOTH BEACON ORANGE TIMEOUT ST (SAFETY) ×2 IMPLANT
COVER LIGHT HANDLE STERIS (MISCELLANEOUS) ×4 IMPLANT
COVER MAYO STAND XLG (DRAPE) ×4 IMPLANT
COVER X RAY CASSETTE (MISCELLANEOUS) ×2 IMPLANT
DRAIN PENROSE 18X1/2 LTX STRL (DRAIN) ×2 IMPLANT
DRAPE ORTHO 2.5IN SPLIT 77X108 (DRAPES) ×1 IMPLANT
DRAPE ORTHO SPLIT 77X108 STRL (DRAPES) ×2
DRAPE PROXIMA HALF (DRAPES) ×2 IMPLANT
EVACUATOR 3/16  PVC DRAIN (DRAIN) ×1
EVACUATOR 3/16 PVC DRAIN (DRAIN) ×1 IMPLANT
FACESHIELD LNG OPTICON STERILE (SAFETY) ×2 IMPLANT
FORMALIN 10 PREFIL 480ML (MISCELLANEOUS) ×2 IMPLANT
GAUZE XEROFORM 5X9 LF (GAUZE/BANDAGES/DRESSINGS) ×2 IMPLANT
GLOVE BIO SURGEON STRL SZ8 (GLOVE) ×2 IMPLANT
GLOVE BIO SURGEON STRL SZ8.5 (GLOVE) ×2 IMPLANT
GLOVE ECLIPSE 6.5 STRL STRAW (GLOVE) ×2 IMPLANT
GLOVE INDICATOR 7.0 STRL GRN (GLOVE) ×2 IMPLANT
GOWN BRE IMP SLV AUR XL STRL (GOWN DISPOSABLE) ×4 IMPLANT
GOWN STRL REIN XL XLG (GOWN DISPOSABLE) ×2 IMPLANT
HANDPIECE INTERPULSE COAX TIP (DISPOSABLE)
INST SET MAJOR BONE (KITS) ×2 IMPLANT
IV NS IRRIG 3000ML ARTHROMATIC (IV SOLUTION) IMPLANT
KIT BLADEGUARD II DBL (SET/KITS/TRAYS/PACK) ×2 IMPLANT
KIT ROOM TURNOVER APOR (KITS) ×2 IMPLANT
MANIFOLD NEPTUNE II (INSTRUMENTS) ×2 IMPLANT
MARKER SKIN DUAL TIP RULER LAB (MISCELLANEOUS) ×2 IMPLANT
NS IRRIG 1000ML POUR BTL (IV SOLUTION) ×2 IMPLANT
PACK TOTAL JOINT (CUSTOM PROCEDURE TRAY) ×2 IMPLANT
PAD ABD 5X9 TENDERSORB (GAUZE/BANDAGES/DRESSINGS) ×3 IMPLANT
PAD ARMBOARD 7.5X6 YLW CONV (MISCELLANEOUS) ×2 IMPLANT
PILLOW HIP ABDUCTION LRG (ORTHOPEDIC SUPPLIES) IMPLANT
PILLOW HIP ABDUCTION MED (ORTHOPEDIC SUPPLIES) ×1 IMPLANT
SET BASIN LINEN APH (SET/KITS/TRAYS/PACK) ×2 IMPLANT
SET HNDPC FAN SPRY TIP SCT (DISPOSABLE) IMPLANT
SOL PREP PROV IODINE SCRUB 4OZ (MISCELLANEOUS) ×2 IMPLANT
SPONGE DRAIN TRACH 4X4 STRL 2S (GAUZE/BANDAGES/DRESSINGS) ×2 IMPLANT
SPONGE GAUZE 4X4 12PLY (GAUZE/BANDAGES/DRESSINGS) ×2 IMPLANT
STAPLER VISISTAT 35W (STAPLE) ×2 IMPLANT
SUT BRALON NAB BRD #1 30IN (SUTURE) ×7 IMPLANT
SUT CHROMIC 1 CTX 36 (SUTURE) ×8 IMPLANT
SUT PLAIN 2 0 XLH (SUTURE) ×7 IMPLANT
SUT SILK 0 FSL (SUTURE) ×2 IMPLANT
SWAB CULTURE LIQ STUART DBL (MISCELLANEOUS) ×2 IMPLANT
TAPE MEDIFIX FOAM 3 (GAUZE/BANDAGES/DRESSINGS) ×2 IMPLANT
TOWER CARTRIDGE SMART MIX (DISPOSABLE) IMPLANT
TRAY FOLEY CATH 14FR (SET/KITS/TRAYS/PACK) ×1 IMPLANT
WATER STERILE IRR 1000ML POUR (IV SOLUTION) ×4 IMPLANT
YANKAUER SUCT 12FT TUBE ARGYLE (SUCTIONS) ×2 IMPLANT

## 2011-03-16 NOTE — Anesthesia Preprocedure Evaluation (Addendum)
Anesthesia Evaluation  Name, MR# and DOB Patient awake  General Assessment Comment  Reviewed: Allergy & Precautions, H&P , NPO status , Patient's Chart, lab work & pertinent test results  Airway Mallampati: II  Neck ROM: Full    Dental  (+) Partial Upper and Partial Lower   Pulmonary  COPD (smoker, am cough)      Cardiovascular Regular Normal    Neuro/Psych Negative Neurological ROS    GI/Hepatic/Renal negative GI ROS            Endo/Other  (+) Diabetes mellitus- (CBG= 297), Poorly Controlled, Type 2, Oral Hypoglycemic Agents,     Abdominal (+) obese,   Musculoskeletal   Hematology   Peds  Reproductive/Obstetrics    Anesthesia Other Findings             Anesthesia Physical Anesthesia Plan  ASA: III  Anesthesia Plan: Spinal   Post-op Pain Management:    Induction:   Airway Management Planned: Nasal Cannula  Additional Equipment:   Intra-op Plan:   Post-operative Plan:   Informed Consent: I have reviewed the patients History and Physical, chart, labs and discussed the procedure including the risks, benefits and alternatives for the proposed anesthesia with the patient or authorized representative who has indicated his/her understanding and acceptance.     Plan Discussed with:   Anesthesia Plan Comments:         Anesthesia Quick Evaluation

## 2011-03-16 NOTE — Progress Notes (Signed)
Subjective: This lady came in with hip fracture and uncontrolled diabetes. She has been started on glipizide 5 mg daily. Her CBGs are still elevated. She is due to have hip surgery this morning or early afternoon. She is n.p.o.           Physical Exam: Blood pressure 125/72, pulse 86, temperature 99.5 F (37.5 C), temperature source Oral, resp. rate 18, height 5\' 7"  (1.702 m), weight 87 kg (191 lb 12.8 oz), SpO2 94.00%. She looks systemically well. There are no new physical findings.   Investigations: Results for orders placed during the hospital encounter of 03/15/11 (from the past 48 hour(s))  HEMOGLOBIN A1C     Status: Abnormal   Collection Time   03/15/11 11:00 AM      Component Value Range Comment   Hemoglobin A1C 10.4 (*) <5.7 (%)    Mean Plasma Glucose 252 (*) <117 (mg/dL)   CBC     Status: Abnormal   Collection Time   03/15/11 11:05 AM      Component Value Range Comment   WBC 11.2 (*) 4.0 - 10.5 (K/uL)    RBC 4.56  3.87 - 5.11 (MIL/uL)    Hemoglobin 13.6  12.0 - 15.0 (g/dL)    HCT 16.1  09.6 - 04.5 (%)    MCV 85.7  78.0 - 100.0 (fL)    MCH 29.8  26.0 - 34.0 (pg)    MCHC 34.8  30.0 - 36.0 (g/dL)    RDW 40.9  81.1 - 91.4 (%)    Platelets 149 (*) 150 - 400 (K/uL)   DIFFERENTIAL     Status: Abnormal   Collection Time   03/15/11 11:05 AM      Component Value Range Comment   Neutrophils Relative 73  43 - 77 (%)    Neutro Abs 8.2 (*) 1.7 - 7.7 (K/uL)    Lymphocytes Relative 22  12 - 46 (%)    Lymphs Abs 2.5  0.7 - 4.0 (K/uL)    Monocytes Relative 5  3 - 12 (%)    Monocytes Absolute 0.5  0.1 - 1.0 (K/uL)    Eosinophils Relative 1  0 - 5 (%)    Eosinophils Absolute 0.1  0.0 - 0.7 (K/uL)    Basophils Relative 0  0 - 1 (%)    Basophils Absolute 0.0  0.0 - 0.1 (K/uL)   BASIC METABOLIC PANEL     Status: Abnormal   Collection Time   03/15/11 11:05 AM      Component Value Range Comment   Sodium 132 (*) 135 - 145 (mEq/L)    Potassium 3.4 (*) 3.5 - 5.1 (mEq/L)    Chloride 99   96 - 112 (mEq/L)    CO2 25  19 - 32 (mEq/L)    Glucose, Bld 280 (*) 70 - 99 (mg/dL)    BUN 17  6 - 23 (mg/dL)    Creatinine, Ser 7.82 (*) 0.50 - 1.10 (mg/dL)    Calcium 9.2  8.4 - 10.5 (mg/dL)    GFR calc non Af Amer >60  >60 (mL/min)    GFR calc Af Amer >60  >60 (mL/min)   PROTIME-INR     Status: Normal   Collection Time   03/15/11 11:05 AM      Component Value Range Comment   Prothrombin Time 13.2  11.6 - 15.2 (seconds)    INR 0.98  0.00 - 1.49    APTT     Status: Normal   Collection  Time   03/15/11 11:05 AM      Component Value Range Comment   aPTT 29  24 - 37 (seconds)   TYPE AND SCREEN     Status: Normal (Preliminary result)   Collection Time   03/15/11 11:10 AM      Component Value Range Comment   ABO/RH(D) A NEG      Antibody Screen NEG      Sample Expiration 03/18/2011      Unit Number [id number]      Blood Component Type RED CELLS,LR      Unit division 00      Status of Unit ALLOCATED      Transfusion Status OK TO TRANSFUSE      Crossmatch Result Compatible      Unit Number [id number]      Blood Component Type RED CELLS,LR      Unit division 00      Status of Unit ALLOCATED      Transfusion Status OK TO TRANSFUSE      Crossmatch Result Compatible     ABO/RH     Status: Normal   Collection Time   03/15/11 11:15 AM      Component Value Range Comment   ABO/RH(D) A NEG     GLUCOSE, CAPILLARY     Status: Abnormal   Collection Time   03/15/11  4:23 PM      Component Value Range Comment   Glucose-Capillary 270 (*) 70 - 99 (mg/dL)    Comment 1 Notify RN      Comment 2 Documented in Chart     GLUCOSE, CAPILLARY     Status: Abnormal   Collection Time   03/15/11  8:39 PM      Component Value Range Comment   Glucose-Capillary 294 (*) 70 - 99 (mg/dL)    Comment 1 Notify RN      Comment 2 Documented in Chart     PREPARE RBC (CROSSMATCH)     Status: Normal   Collection Time   03/15/11 11:41 PM      Component Value Range Comment   Order Confirmation ORDER PROCESSED BY BLOOD  BANK     CBC     Status: Abnormal   Collection Time   03/16/11  5:47 AM      Component Value Range Comment   WBC 11.9 (*) 4.0 - 10.5 (K/uL)    RBC 4.47  3.87 - 5.11 (MIL/uL)    Hemoglobin 13.5  12.0 - 15.0 (g/dL)    HCT 78.2  95.6 - 21.3 (%)    MCV 86.4  78.0 - 100.0 (fL)    MCH 30.2  26.0 - 34.0 (pg)    MCHC 35.0  30.0 - 36.0 (g/dL)    RDW 08.6  57.8 - 46.9 (%)    Platelets 142 (*) 150 - 400 (K/uL)   DIFFERENTIAL     Status: Abnormal   Collection Time   03/16/11  5:47 AM      Component Value Range Comment   Neutrophils Relative 67  43 - 77 (%)    Neutro Abs 8.0 (*) 1.7 - 7.7 (K/uL)    Lymphocytes Relative 26  12 - 46 (%)    Lymphs Abs 3.1  0.7 - 4.0 (K/uL)    Monocytes Relative 6  3 - 12 (%)    Monocytes Absolute 0.7  0.1 - 1.0 (K/uL)    Eosinophils Relative 1  0 - 5 (%)    Eosinophils Absolute 0.1  0.0 - 0.7 (K/uL)    Basophils Relative 0  0 - 1 (%)    Basophils Absolute 0.0  0.0 - 0.1 (K/uL)   BASIC METABOLIC PANEL     Status: Abnormal   Collection Time   03/16/11  5:47 AM      Component Value Range Comment   Sodium 134 (*) 135 - 145 (mEq/L)    Potassium 3.4 (*) 3.5 - 5.1 (mEq/L)    Chloride 98  96 - 112 (mEq/L)    CO2 29  19 - 32 (mEq/L)    Glucose, Bld 273 (*) 70 - 99 (mg/dL)    BUN 10  6 - 23 (mg/dL)    Creatinine, Ser 4.78 (*) 0.50 - 1.10 (mg/dL)    Calcium 9.0  8.4 - 10.5 (mg/dL)    GFR calc non Af Amer >60  >60 (mL/min)    GFR calc Af Amer >60  >60 (mL/min)   LIPID PANEL     Status: Abnormal   Collection Time   03/16/11  5:47 AM      Component Value Range Comment   Cholesterol 136  0 - 200 (mg/dL)    Triglycerides 295 (*) <150 (mg/dL)    HDL 31 (*) >62 (mg/dL)    Total CHOL/HDL Ratio 4.4      VLDL 45 (*) 0 - 40 (mg/dL)    LDL Cholesterol 60  0 - 99 (mg/dL)   GLUCOSE, CAPILLARY     Status: Abnormal   Collection Time   03/16/11  7:38 AM      Component Value Range Comment   Glucose-Capillary 269 (*) 70 - 99 (mg/dL)    Comment 1 Notify RN     GLUCOSE,  CAPILLARY     Status: Abnormal   Collection Time   03/16/11  8:10 AM      Component Value Range Comment   Glucose-Capillary 302 (*) 70 - 99 (mg/dL)    Comment 1 Notify RN      No results found for this or any previous visit (from the past 240 hour(s)).  Dg Chest 1 View  03/15/2011  *RADIOLOGY REPORT*  Clinical Data: Preoperative respiratory exam for hip fracture  CHEST - 1 VIEW  Comparison: None.  Findings: Lungs are clear.  No edema or infiltrate.  No pneumothorax.  No pleural fluid.  Heart size is normal.  Visualized bony thorax is unremarkable.  IMPRESSION: No active disease.  Original Report Authenticated By: Reola Calkins, M.D.   Dg Hip Complete Right  03/15/2011  *RADIOLOGY REPORT*  Clinical Data: Fall with right hip pain.  RIGHT HIP - COMPLETE 2+ VIEW  Comparison: None.  Findings: Complete subcapital hip fracture present with angulation and impaction.  No dislocation.  The rest of the bony pelvis is intact.  Moderate degenerative changes are seen in the visualized left hip joint.  IMPRESSION: Acute right subcapital hip fracture.  Original Report Authenticated By: Reola Calkins, M.D.      Medications: I have reviewed the patient's current medications.  Impression: 1. Uncontrolled type 2 diabetes mellitus.     Plan: 1. Increase glipizide to 5 mg twice a day and continue with sliding scale of insulin.     LOS: 1 day   Prachi Oftedahl C 03/16/2011, 10:02 AM

## 2011-03-16 NOTE — Transfer of Care (Signed)
Immediate Anesthesia Transfer of Care Note  Patient: Brandy Fisher  Procedure(s) Performed:  ARTHROPLASTY BIPOLAR HIP  Patient Location: PACU  Anesthesia Type: Spinal  Level of Consciousness: awake, alert , oriented and patient cooperative  Airway & Oxygen Therapy: Patient Spontanous Breathing and Patient connected to face mask oxygen  Post-op Assessment: Report given to PACU RN and Post -op Vital signs reviewed and stable  Post vital signs: Reviewed and stable  Complications: No apparent anesthesia complications

## 2011-03-16 NOTE — Anesthesia Postprocedure Evaluation (Addendum)
  Anesthesia Post-op Note  Patient: Brandy Fisher  Procedure(s) Performed:  ARTHROPLASTY BIPOLAR HIP  Patient Location: PACU  Anesthesia Type: Spinal  Level of Consciousness: awake  Airway and Oxygen Therapy: Patient Spontanous Breathing and Patient connected to nasal cannula oxygen  Post-op Pain: none  Post-op Assessment: Post-op Vital signs reviewed, Patient's Cardiovascular Status Stable, Respiratory Function Stable and No signs of Nausea or vomiting  Post-op Vital Signs: stable  Complications: No apparent anesthesia complications   03-17-11 1138  Patient seen.  Patient reports nausea.  No anesthetic complications noted.

## 2011-03-16 NOTE — Brief Op Note (Signed)
03/15/2011 - 03/16/2011  2:55 PM  PATIENT:  Brandy Fisher  55 y.o. female  PRE-OPERATIVE DIAGNOSIS:  fx right hip  POST-OPERATIVE DIAGNOSIS:  fx right hip  PROCEDURE:  Procedure(s): ARTHROPLASTY BIPOLAR HIP  SURGEON:  Surgeon(s): [employer]  PHYSICIAN ASSISTANT:   ASSISTANTS: none   ANESTHESIA:   spinal  OR FLUID I/O:  Total I/O In: 100 [I.V.:100] Out: 1450 [Urine:1325; Blood:125]  BLOOD ADMINISTERED:none  DRAINS: Large Hemovac, sown in   LOCAL MEDICATIONS USED:  NONE  SPECIMEN:  Source of Specimen:  Right Femoral Head  DISPOSITION OF SPECIMEN:  PATHOLOGY  COUNTS:  YES  TOURNIQUET:  * No tourniquets in log *  DICTATION: .Other Dictation: Dictation Number [id number]  PLAN OF CARE: Admit to inpatient   PATIENT DISPOSITION:  PACU - hemodynamically stable.   Delay start of Pharmacological VTE agent (>24hrs) due to surgical blood loss or risk of bleeding:  no

## 2011-03-16 NOTE — Anesthesia Procedure Notes (Addendum)
Spinal Block  Patient location during procedure: OR Start time: 03/16/2011 1:33 PM Staffing CRNA/Resident: Neftaly Inzunza J Preanesthetic Checklist Completed: patient identified, site marked, surgical consent, pre-op evaluation, timeout performed, IV checked, risks and benefits discussed and monitors and equipment checked Spinal Block Patient position: right lateral decubitus Prep: Betadine Patient monitoring: blood pressure, continuous pulse ox, cardiac monitor and heart rate Approach: right paramedian Location: L3-4 Injection technique: single-shot Needle Needle type: Spinocan  Assessment Sensory level: T10 Additional Notes #[id number]...Marland KitchenMarland Kitchen2012-12

## 2011-03-17 LAB — COMPREHENSIVE METABOLIC PANEL
ALT: 16 U/L (ref 0–35)
AST: 21 U/L (ref 0–37)
Albumin: 2.7 g/dL — ABNORMAL LOW (ref 3.5–5.2)
Alkaline Phosphatase: 89 U/L (ref 39–117)
BUN: 6 mg/dL (ref 6–23)
CO2: 29 mEq/L (ref 19–32)
Calcium: 8.4 mg/dL (ref 8.4–10.5)
Chloride: 97 mEq/L (ref 96–112)
Creatinine, Ser: <0.47 mg/dL — ABNORMAL LOW (ref 0.50–1.10)
Glucose, Bld: 255 mg/dL — ABNORMAL HIGH (ref 70–99)
Potassium: 3.6 mEq/L (ref 3.5–5.1)
Sodium: 133 mEq/L — ABNORMAL LOW (ref 135–145)
Total Bilirubin: 0.8 mg/dL (ref 0.3–1.2)
Total Protein: 6 g/dL (ref 6.0–8.3)

## 2011-03-17 LAB — DIFFERENTIAL
Basophils Absolute: 0 10*3/uL (ref 0.0–0.1)
Basophils Relative: 0 % (ref 0–1)
Eosinophils Absolute: 0.1 10*3/uL (ref 0.0–0.7)
Eosinophils Relative: 1 % (ref 0–5)
Lymphocytes Relative: 25 % (ref 12–46)
Lymphs Abs: 2.8 10*3/uL (ref 0.7–4.0)
Monocytes Absolute: 0.9 10*3/uL (ref 0.1–1.0)
Monocytes Relative: 8 % (ref 3–12)
Neutro Abs: 7.1 10*3/uL (ref 1.7–7.7)
Neutrophils Relative %: 65 % (ref 43–77)

## 2011-03-17 LAB — CBC
HCT: 34.5 % — ABNORMAL LOW (ref 36.0–46.0)
Hemoglobin: 11.9 g/dL — ABNORMAL LOW (ref 12.0–15.0)
MCH: 30.1 pg (ref 26.0–34.0)
MCHC: 34.5 g/dL (ref 30.0–36.0)
MCV: 87.1 fL (ref 78.0–100.0)
Platelets: 137 10*3/uL — ABNORMAL LOW (ref 150–400)
RBC: 3.96 MIL/uL (ref 3.87–5.11)
RDW: 12.4 % (ref 11.5–15.5)
WBC: 10.8 10*3/uL — ABNORMAL HIGH (ref 4.0–10.5)

## 2011-03-17 LAB — GLUCOSE, CAPILLARY
Glucose-Capillary: 247 mg/dL — ABNORMAL HIGH (ref 70–99)
Glucose-Capillary: 242 mg/dL — ABNORMAL HIGH (ref 70–99)
Glucose-Capillary: 217 mg/dL — ABNORMAL HIGH (ref 70–99)
Glucose-Capillary: 275 mg/dL — ABNORMAL HIGH (ref 70–99)
Glucose-Capillary: 240 mg/dL — ABNORMAL HIGH (ref 70–99)

## 2011-03-17 LAB — HEMOGLOBIN A1C
Hgb A1c MFr Bld: 9.8 % — ABNORMAL HIGH (ref <5.7)
Mean Plasma Glucose: 235 mg/dL — ABNORMAL HIGH (ref <117)

## 2011-03-17 MED ORDER — SODIUM CHLORIDE 0.9 % IN NEBU
INHALATION_SOLUTION | RESPIRATORY_TRACT | Status: AC
  Administered 2011-03-17: 3 mL
  Filled 2011-03-17: qty 3

## 2011-03-17 MED ORDER — GLIPIZIDE 5 MG PO TABS
10.0000 mg | ORAL_TABLET | Freq: Two times a day (BID) | ORAL | Status: DC
  Administered 2011-03-17 – 2011-03-20 (×6): 10 mg via ORAL
  Filled 2011-03-17 (×6): qty 2

## 2011-03-17 MED ORDER — HYDROMORPHONE 0.3 MG/ML IV SOLN
INTRAVENOUS | Status: AC
  Administered 2011-03-17: 09:00:00
  Filled 2011-03-17: qty 25

## 2011-03-17 MED ORDER — INFLUENZA VIRUS VACC SPLIT PF IM SUSP
0.5000 mL | Freq: Once | INTRAMUSCULAR | Status: AC
  Administered 2011-03-17: 0.5 mL via INTRAMUSCULAR
  Filled 2011-03-17: qty 0.5

## 2011-03-17 NOTE — Progress Notes (Signed)
Subjective: This lady came in with hip fracture and uncontrolled diabetes. Her CBGs are still elevated despite being on an increased dose of glipizide 5 mg twice a day.           Physical Exam: Blood pressure 132/74, pulse 93, temperature 98.6 F (37 C), temperature source Oral, resp. rate 16, height 5\' 7"  (1.702 m), weight 87 kg (191 lb 12.8 oz), SpO2 94.00%. She looks systemically well. There are no new physical findings.   Investigations: Results for orders placed during the hospital encounter of 03/15/11 (from the past 48 hour(s))  HEMOGLOBIN A1C     Status: Abnormal   Collection Time   03/15/11 11:00 AM      Component Value Range Comment   Hemoglobin A1C 10.4 (*) <5.7 (%)    Mean Plasma Glucose 252 (*) <117 (mg/dL)   CBC     Status: Abnormal   Collection Time   03/15/11 11:05 AM      Component Value Range Comment   WBC 11.2 (*) 4.0 - 10.5 (K/uL)    RBC 4.56  3.87 - 5.11 (MIL/uL)    Hemoglobin 13.6  12.0 - 15.0 (g/dL)    HCT 16.1  09.6 - 04.5 (%)    MCV 85.7  78.0 - 100.0 (fL)    MCH 29.8  26.0 - 34.0 (pg)    MCHC 34.8  30.0 - 36.0 (g/dL)    RDW 40.9  81.1 - 91.4 (%)    Platelets 149 (*) 150 - 400 (K/uL)   DIFFERENTIAL     Status: Abnormal   Collection Time   03/15/11 11:05 AM      Component Value Range Comment   Neutrophils Relative 73  43 - 77 (%)    Neutro Abs 8.2 (*) 1.7 - 7.7 (K/uL)    Lymphocytes Relative 22  12 - 46 (%)    Lymphs Abs 2.5  0.7 - 4.0 (K/uL)    Monocytes Relative 5  3 - 12 (%)    Monocytes Absolute 0.5  0.1 - 1.0 (K/uL)    Eosinophils Relative 1  0 - 5 (%)    Eosinophils Absolute 0.1  0.0 - 0.7 (K/uL)    Basophils Relative 0  0 - 1 (%)    Basophils Absolute 0.0  0.0 - 0.1 (K/uL)   BASIC METABOLIC PANEL     Status: Abnormal   Collection Time   03/15/11 11:05 AM      Component Value Range Comment   Sodium 132 (*) 135 - 145 (mEq/L)    Potassium 3.4 (*) 3.5 - 5.1 (mEq/L)    Chloride 99  96 - 112 (mEq/L)    CO2 25  19 - 32 (mEq/L)    Glucose,  Bld 280 (*) 70 - 99 (mg/dL)    BUN 17  6 - 23 (mg/dL)    Creatinine, Ser 7.82 (*) 0.50 - 1.10 (mg/dL)    Calcium 9.2  8.4 - 10.5 (mg/dL)    GFR calc non Af Amer >60  >60 (mL/min)    GFR calc Af Amer >60  >60 (mL/min)   PROTIME-INR     Status: Normal   Collection Time   03/15/11 11:05 AM      Component Value Range Comment   Prothrombin Time 13.2  11.6 - 15.2 (seconds)    INR 0.98  0.00 - 1.49    APTT     Status: Normal   Collection Time   03/15/11 11:05 AM  Component Value Range Comment   aPTT 29  24 - 37 (seconds)   TYPE AND SCREEN     Status: Normal (Preliminary result)   Collection Time   03/15/11 11:10 AM      Component Value Range Comment   ABO/RH(D) A NEG      Antibody Screen NEG      Sample Expiration 03/18/2011      Unit Number [id number]      Blood Component Type RED CELLS,LR      Unit division 00      Status of Unit ALLOCATED      Transfusion Status OK TO TRANSFUSE      Crossmatch Result Compatible      Unit Number [id number]      Blood Component Type RED CELLS,LR      Unit division 00      Status of Unit ALLOCATED      Transfusion Status OK TO TRANSFUSE      Crossmatch Result Compatible     ABO/RH     Status: Normal   Collection Time   03/15/11 11:15 AM      Component Value Range Comment   ABO/RH(D) A NEG     GLUCOSE, CAPILLARY     Status: Abnormal   Collection Time   03/15/11  4:23 PM      Component Value Range Comment   Glucose-Capillary 270 (*) 70 - 99 (mg/dL)    Comment 1 Notify RN      Comment 2 Documented in Chart     GLUCOSE, CAPILLARY     Status: Abnormal   Collection Time   03/15/11  8:39 PM      Component Value Range Comment   Glucose-Capillary 294 (*) 70 - 99 (mg/dL)    Comment 1 Notify RN      Comment 2 Documented in Chart     PREPARE RBC (CROSSMATCH)     Status: Normal   Collection Time   03/15/11 11:41 PM      Component Value Range Comment   Order Confirmation ORDER PROCESSED BY BLOOD BANK     CBC     Status: Abnormal   Collection Time    03/16/11  5:47 AM      Component Value Range Comment   WBC 11.9 (*) 4.0 - 10.5 (K/uL)    RBC 4.47  3.87 - 5.11 (MIL/uL)    Hemoglobin 13.5  12.0 - 15.0 (g/dL)    HCT 78.2  95.6 - 21.3 (%)    MCV 86.4  78.0 - 100.0 (fL)    MCH 30.2  26.0 - 34.0 (pg)    MCHC 35.0  30.0 - 36.0 (g/dL)    RDW 08.6  57.8 - 46.9 (%)    Platelets 142 (*) 150 - 400 (K/uL)   DIFFERENTIAL     Status: Abnormal   Collection Time   03/16/11  5:47 AM      Component Value Range Comment   Neutrophils Relative 67  43 - 77 (%)    Neutro Abs 8.0 (*) 1.7 - 7.7 (K/uL)    Lymphocytes Relative 26  12 - 46 (%)    Lymphs Abs 3.1  0.7 - 4.0 (K/uL)    Monocytes Relative 6  3 - 12 (%)    Monocytes Absolute 0.7  0.1 - 1.0 (K/uL)    Eosinophils Relative 1  0 - 5 (%)    Eosinophils Absolute 0.1  0.0 - 0.7 (K/uL)    Basophils Relative 0  0 - 1 (%)    Basophils Absolute 0.0  0.0 - 0.1 (K/uL)   BASIC METABOLIC PANEL     Status: Abnormal   Collection Time   03/16/11  5:47 AM      Component Value Range Comment   Sodium 134 (*) 135 - 145 (mEq/L)    Potassium 3.4 (*) 3.5 - 5.1 (mEq/L)    Chloride 98  96 - 112 (mEq/L)    CO2 29  19 - 32 (mEq/L)    Glucose, Bld 273 (*) 70 - 99 (mg/dL)    BUN 10  6 - 23 (mg/dL)    Creatinine, Ser 1.61 (*) 0.50 - 1.10 (mg/dL)    Calcium 9.0  8.4 - 10.5 (mg/dL)    GFR calc non Af Amer >60  >60 (mL/min)    GFR calc Af Amer >60  >60 (mL/min)   LIPID PANEL     Status: Abnormal   Collection Time   03/16/11  5:47 AM      Component Value Range Comment   Cholesterol 136  0 - 200 (mg/dL)    Triglycerides 096 (*) <150 (mg/dL)    HDL 31 (*) >04 (mg/dL)    Total CHOL/HDL Ratio 4.4      VLDL 45 (*) 0 - 40 (mg/dL)    LDL Cholesterol 60  0 - 99 (mg/dL)   GLUCOSE, CAPILLARY     Status: Abnormal   Collection Time   03/16/11  7:38 AM      Component Value Range Comment   Glucose-Capillary 269 (*) 70 - 99 (mg/dL)    Comment 1 Notify RN     GLUCOSE, CAPILLARY     Status: Abnormal   Collection Time   03/16/11   8:10 AM      Component Value Range Comment   Glucose-Capillary 302 (*) 70 - 99 (mg/dL)    Comment 1 Notify RN     GLUCOSE, CAPILLARY     Status: Abnormal   Collection Time   03/16/11 11:22 AM      Component Value Range Comment   Glucose-Capillary 297 (*) 70 - 99 (mg/dL)   GLUCOSE, CAPILLARY     Status: Abnormal   Collection Time   03/16/11  3:21 PM      Component Value Range Comment   Glucose-Capillary 345 (*) 70 - 99 (mg/dL)   GLUCOSE, CAPILLARY     Status: Abnormal   Collection Time   03/16/11  4:01 PM      Component Value Range Comment   Glucose-Capillary 298 (*) 70 - 99 (mg/dL)   GLUCOSE, CAPILLARY     Status: Abnormal   Collection Time   03/16/11  8:55 PM      Component Value Range Comment   Glucose-Capillary 247 (*) 70 - 99 (mg/dL)   CBC     Status: Abnormal   Collection Time   03/17/11  5:52 AM      Component Value Range Comment   WBC 10.8 (*) 4.0 - 10.5 (K/uL)    RBC 3.96  3.87 - 5.11 (MIL/uL)    Hemoglobin 11.9 (*) 12.0 - 15.0 (g/dL)    HCT 54.0 (*) 98.1 - 46.0 (%)    MCV 87.1  78.0 - 100.0 (fL)    MCH 30.1  26.0 - 34.0 (pg)    MCHC 34.5  30.0 - 36.0 (g/dL)    RDW 19.1  47.8 - 29.5 (%)    Platelets 137 (*) 150 - 400 (K/uL)   COMPREHENSIVE METABOLIC  PANEL     Status: Abnormal   Collection Time   03/17/11  5:52 AM      Component Value Range Comment   Sodium 133 (*) 135 - 145 (mEq/L)    Potassium 3.6  3.5 - 5.1 (mEq/L)    Chloride 97  96 - 112 (mEq/L)    CO2 29  19 - 32 (mEq/L)    Glucose, Bld 255 (*) 70 - 99 (mg/dL)    BUN 6  6 - 23 (mg/dL)    Creatinine, Ser <1.61 (*) 0.50 - 1.10 (mg/dL)    Calcium 8.4  8.4 - 10.5 (mg/dL)    Total Protein 6.0  6.0 - 8.3 (g/dL)    Albumin 2.7 (*) 3.5 - 5.2 (g/dL)    AST 21  0 - 37 (U/L)    ALT 16  0 - 35 (U/L)    Alkaline Phosphatase 89  39 - 117 (U/L)    Total Bilirubin 0.8  0.3 - 1.2 (mg/dL)    GFR calc non Af Amer NOT CALCULATED  >60 (mL/min)    GFR calc Af Amer NOT CALCULATED  >60 (mL/min)   DIFFERENTIAL     Status:  Normal   Collection Time   03/17/11  5:52 AM      Component Value Range Comment   Neutrophils Relative 65  43 - 77 (%)    Neutro Abs 7.1  1.7 - 7.7 (K/uL)    Lymphocytes Relative 25  12 - 46 (%)    Lymphs Abs 2.8  0.7 - 4.0 (K/uL)    Monocytes Relative 8  3 - 12 (%)    Monocytes Absolute 0.9  0.1 - 1.0 (K/uL)    Eosinophils Relative 1  0 - 5 (%)    Eosinophils Absolute 0.1  0.0 - 0.7 (K/uL)    Basophils Relative 0  0 - 1 (%)    Basophils Absolute 0.0  0.0 - 0.1 (K/uL)   GLUCOSE, CAPILLARY     Status: Abnormal   Collection Time   03/17/11  7:46 AM      Component Value Range Comment   Glucose-Capillary 242 (*) 70 - 99 (mg/dL)    No results found for this or any previous visit (from the past 240 hour(s)).  Dg Chest 1 View  03/15/2011  *RADIOLOGY REPORT*  Clinical Data: Preoperative respiratory exam for hip fracture  CHEST - 1 VIEW  Comparison: None.  Findings: Lungs are clear.  No edema or infiltrate.  No pneumothorax.  No pleural fluid.  Heart size is normal.  Visualized bony thorax is unremarkable.  IMPRESSION: No active disease.  Original Report Authenticated By: Reola Calkins, M.D.   Dg Hip Complete Right  03/15/2011  *RADIOLOGY REPORT*  Clinical Data: Fall with right hip pain.  RIGHT HIP - COMPLETE 2+ VIEW  Comparison: None.  Findings: Complete subcapital hip fracture present with angulation and impaction.  No dislocation.  The rest of the bony pelvis is intact.  Moderate degenerative changes are seen in the visualized left hip joint.  IMPRESSION: Acute right subcapital hip fracture.  Original Report Authenticated By: Reola Calkins, M.D.   Dg Hip Portable 1 View Right  03/16/2011  *RADIOLOGY REPORT*  Clinical Data: Right femoral neck fracture post hip replacement  PORTABLE RIGHT HIP - 1 VIEW  Comparison: Portable exam 1441 hours compared to 03/15/2011  Findings: Components of right hip prosthesis now identified in expected positions. Surgical drain and expected postsurgical soft  tissue changes identified. No fracture,  dislocation or acute complications seen.  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  Original Report Authenticated By: Lollie Marrow, M.D.      Medications: I have reviewed the patient's current medications.  Impression: 1. Uncontrolled type 2 diabetes mellitus.     Plan: 1. Increase glipizide to10 mg twice a day and continue with sliding scale of insulin.     LOS: 2 days   GOSRANI,NIMISH C 03/17/2011, 9:19 AM

## 2011-03-17 NOTE — Progress Notes (Signed)
Physical Therapy Treatment Patient Name: Brandy Fisher Date: 03/17/2011  TIME: 1240-130 Problem List: There is no problem list on file for this patient.  Past Medical History:  Past Medical History  Diagnosis Date  . Diabetes mellitus    Past Surgical History:  Past Surgical History  Procedure Date  . Cesarean section   . Breast surgery     Left breast biopsy, negative  . Cholecystectomy    Precautions/Restrictions  Precautions Precautions: Fall;Posterior Hip Required Braces or Orthoses: No Restrictions Weight Bearing Restrictions: Yes RLE Weight Bearing: Touchdown weight bearing Mobility (including Balance) Bed Mobility Bed Mobility: Yes Supine to Sit: 4: Min assist Supine to Sit Details (indicate cue type and reason): HOB elevated 80 degrees; vc's for hand placement;pt able to 75% of movement just very slowly Sitting - Scoot to Edge of Bed: 4: Min assist Sitting - Scoot to [employer] of Bed Details (indicate cue type and reason): Pt moving extremely slow but able to get to EOB with min A;pulling pad to scoot her to EOB Transfers Transfers: Yes Stand Pivot Transfers: 2: Max Actuary Details (indicate cue type and reason): Max A +2;pt needing vc's to pivot LLE Ambulation/Gait Ambulation/Gait: No Stairs: No Wheelchair Mobility Wheelchair Mobility: No  Posture/Postural Control Posture/Postural Control: No significant limitations Balance Balance Assessed: No Exercise  Total Joint Exercises Ankle Circles/Pumps: Both;10 reps Quad Sets: AROM;Both;10 reps;Supine Gluteal Sets: AROM;Both;10 reps;Supine Short Arc Quad: Both;10 reps Heel Slides: Both;10 reps Hip ABduction/ADduction: Both;10 reps General Exercises - Lower Extremity Ankle Circles/Pumps: Both;10 reps Quad Sets: AROM;Both;10 reps;Supine Gluteal Sets: AROM;Both;10 reps;Supine Short Arc Quad: Both;10 reps Heel Slides: Both;10 reps Hip ABduction/ADduction: Both;10 reps Low Level/ICU  Exercises Ankle Circles/Pumps: Both;10 reps Quad Sets: AROM;Both;10 reps;Supine Short Arc Quad: Both;10 reps Hip ABduction/ADduction: Both;10 reps Heel Slides: Both;10 reps  End of Session PT - End of Session Activity Tolerance: Patient limited by pain;Patient limited by fatigue Patient left: in bed;with call bell in reach PT Assessment/Plan  PT - Assessment/Plan Comments on Treatment Session: Pt wanting to able (extremely slow due to painof R hip) to do bed mobility with min to mod A;Max A= for pivot transfer bed<>recliner PT Goals  Acute Rehab PT Goals PT Goal Formulation: With patient Pt will go Supine/Side to Sit: with mod assist PT Goal: Supine/Side to Sit - Progress: Progressing toward goal Pt will go Sit to Supine/Side: with mod assist PT Goal: Sit to Supine/Side - Progress: Progressing toward goal Pt will Stand: with max assist PT Goal: Stand - Progress: Progressing toward goal Pt will Ambulate: 1 - 15 feet;with max assist;with rolling walker  Kaaren Nass ATKINSO 03/17/2011, 1:41 PM

## 2011-03-17 NOTE — Progress Notes (Signed)
Physical Therapy Evaluation Patient Name: Brandy Fisher [id number] Date: 03/17/2011 Problem List: There is no problem list on file for this patient.  Past Medical History:  Past Medical History  Diagnosis Date  . Diabetes mellitus    Past Surgical History:  Past Surgical History  Procedure Date  . Cesarean section   . Breast surgery     Left breast biopsy, negative  . Cholecystectomy     Precautions/Restrictions  Precautions Precautions: Fall;Posterior Hip Required Braces or Orthoses: No Restrictions Weight Bearing Restrictions: Yes RLE Weight Bearing: Touchdown weight bearing Prior Functioning  Home Living Type of Home: Other (Comment) (pt unable to describe) Lives With: Family Receives Help From: Family Home Layout: One level Home Access: Stairs to enter Entrance Stairs-Rails: None Entrance Stairs-Number of Steps: 1--(18 high) Home Adaptive Equipment: None Prior Function Level of Independence: Independent with basic ADLs;Independent with homemaking with ambulation;Independent with homemaking with wheelchair;Independent with gait;Independent with transfers Comments: too sleepy to describe Cognition Cognition Arousal/Alertness: Lethargic Overall Cognitive Status: Appears within functional limits for tasks assessed Orientation Level: Oriented X4 Sensation/Coordination Sensation Light Touch: Appears Intact Extremity Assessment RUE Assessment RUE Assessment: Within Functional Limits LUE Assessment LUE Assessment: Within Functional Limits RLE Assessment RLE Assessment: Exceptions to [facility] RLE PROM (degrees) Right Hip Flexion 0-125: 30  Right Hip ABduction 0-45: 20  LLE Assessment LLE Assessment: Within Functional Limits Mobility (including Balance) Bed Mobility Bed Mobility: Yes Supine to Sit: 2: Max assist;HOB elevated (Comment degrees) (60 degrees) Sitting - Scoot to Edge of Bed: 1: +1 Total assist Sitting - Scoot to Edge of Bed Details (indicate cue type and  reason): strongly resists sitting Transfers Transfers: No (pt refuses) Ambulation/Gait Ambulation/Gait: No Stairs: No Wheelchair Mobility Wheelchair Mobility: No  Posture/Postural Control Posture/Postural Control: No significant limitations Balance Balance Assessed: No Exercise  Total Joint Exercises Ankle Circles/Pumps: AROM;Both;10 reps;Supine Quad Sets: AROM;Both;10 reps;Supine Gluteal Sets: AROM;Both;10 reps;Supine Short Arc Quad: AAROM;Right;10 reps;Supine Heel Slides: AAROM;Right;5 reps;Supine Hip ABduction/ADduction: AAROM;Right;5 reps;Supine General Exercises - Lower Extremity Ankle Circles/Pumps: AROM;Both;10 reps;Supine Quad Sets: AROM;Both;10 reps;Supine Gluteal Sets: AROM;Both;10 reps;Supine Short Arc Quad: AAROM;Right;10 reps;Supine Heel Slides: AAROM;Right;5 reps;Supine Hip ABduction/ADduction: AAROM;Right;5 reps;Supine Low Level/ICU Exercises Ankle Circles/Pumps: AROM;Both;10 reps;Supine Quad Sets: AROM;Both;10 reps;Supine Short Arc Quad: AAROM;Right;10 reps;Supine Hip ABduction/ADduction: AAROM;Right;5 reps;Supine Heel Slides: AAROM;Right;5 reps;Supine  End of Session PT - End of Session Activity Tolerance: Patient limited by pain;Patient limited by fatigue Patient left: in bed;with call bell in reach PT Assessment/Plan/Recommendation PT Assessment Clinical Impression Statement: very poorly motivated pt who (after an hour of working with her) refused to even try to transfer OOB--she was able to sit at EOB for 5 min with mod assist-extremely drowsy from continual use of PCA and O2 sats in the high 70s on room air-have notified RN about heavy sedation PT Recommendation/Assessment: Patient will need skilled PT in the acute care venue PT Problem List: Decreased strength;Decreased range of motion;Decreased activity tolerance;Decreased balance;Decreased mobility;Decreased knowledge of use of DME;Decreased safety awareness;Decreased knowledge of  precautions;Pain Barriers to Discharge: Inaccessible home environment PT Therapy Diagnosis : Difficulty walking;Acute pain;Generalized weakness PT Goals  Acute Rehab PT Goals PT Goal Formulation: With patient Pt will go Supine/Side to Sit: with mod assist Pt will go Sit to Supine/Side: with mod assist Pt will Stand: with max assist Pt will Ambulate: 1 - 15 feet;with max assist;with rolling walker [name] 03/17/2011, 10:12 AM

## 2011-03-17 NOTE — Op Note (Signed)
Brandy Fisher, Brandy Fisher              ACCOUNT NO.:  [id number]  MEDICAL RECORD NO.:  [id number]  LOCATION:  [id number]                          FACILITY:  [facility]  PHYSICIAN:  J. Darreld Mclean, M.D. DATE OF BIRTH:  01-Jan-1956  DATE OF PROCEDURE:  03/16/2011 DATE OF DISCHARGE:                              OPERATIVE REPORT   PREOPERATIVE DIAGNOSIS:  Subcapital fracture, right hip.  POSTOPERATIVE DIAGNOSIS:  Subcapital fracture, right hip.  PROCEDURE:  Right hip bipolar prosthesis, placement using [employer] # 49.  He had 0 neck and a 14 bipolar stem.  ANESTHESIA:  Spinal.  SURGEON:  J. Darreld Mclean, MD  ESTIMATED BLOOD LOSS:  Less than 100 mL.  DRAINS:  One large Hemovac drain placed.  No blood given.  INDICATIONS:  The patient is a 55 year old female who fell yesterday, Sunday, around her vehicle and ready to go to the church, hurt her hip. X-rays show displaced subcapital fracture of the right hip.  The patient has underlying diabetes mellitus poorly controlled.  She was seen by the hospitalist and they managed diabetes.  Went over the risks and imponderables of the procedure preoperatively.  She appears to agree and understood the procedure as outlined.  DESCRIPTION OF PROCEDURE:  The patient was seen in the holding area. The right hip identified as correct surgical site.  I  placed a mark on the right hip.  She brought to the OR and given spinal anesthesia.  She was placed right side up the left side down decubitus position held in place by supports.  She was prepped and draped in the usual manner.  A generalized time-out identifying the patient is [name], on right hip for bipolar hip replacement, all instrumentation was properly positioned and ready to use and working and the operating room team knew each other.  She received antibiotics preoperatively.  Outline for incision made and posterior approach was made to the hip.  The sciatic nerve was identified.  Penrose  drain was placed around, short external rotators were identified, tagged and cut, hip capsule was exposed and opened and culture was obtained.  Femoral head was removed, measured 49 mm.  49-mm head side was told to the staff to obtain.  The hip area was then rasped and reamed up to a size 14.  Femoral neck was cut and size 14 trial was done with 0 neck and a 14 head fit nicely.  Leg lengths were equal and was stable.  This was selected.  Trial prosthesis was removed.  The Burnet prosthesis was inserted first the 14 stem in the bipolar head and 0 neck.  Hip was reduced.  X-rays were taken.  These looked good in single AP view.  Short external rotators were reapproximated.  A second nerve was identified and found to have no apparent injury.  Penrose drain was removed.  Hemovac drain was placed, sewn with 2-0 silk.  Fascial layer was reapproximated using #1 Surgilon suture, interrupted figure-of-eight subcutaneous tissue closed in layers.  Skin reapproximated with skin staples.  Sterile dressing applied.  Abduction pillow applied.  The patient tolerated the procedure well and go to recovery in good condition.  ______________________________ Shela Commons. Darreld Mclean, M.D.     JWK/MEDQ  D:  03/16/2011  T:  03/17/2011  Job:  [id number]

## 2011-03-17 NOTE — Addendum Note (Signed)
Addendum  created 03/17/11 1140 by Corena Pilgrim, CRNA   Modules edited:Notes Section

## 2011-03-17 NOTE — Addendum Note (Signed)
Addendum  created 03/17/11 1129 by Corena Pilgrim, CRNA   Modules edited:Anesthesia Events

## 2011-03-17 NOTE — Progress Notes (Signed)
She is afebrile.  Vital signs are stable.  Neurovascular is intact.  Leg lengths are equal.  She complains of some pain but it is controlled by the PCA.  Physical therapy is in the room this am and she will be getting out of bed shortly.  Her Hgb is stable.  The hospitalist continues to work with her diabetes control.  I will have social services consult about possible nursing home placement and trying to obtain Medicaid for the patient and also for post op diabetes management.

## 2011-03-18 DIAGNOSIS — S72001A Fracture of unspecified part of neck of right femur, initial encounter for closed fracture: Secondary | ICD-10-CM | POA: Diagnosis present

## 2011-03-18 DIAGNOSIS — E119 Type 2 diabetes mellitus without complications: Secondary | ICD-10-CM | POA: Diagnosis present

## 2011-03-18 LAB — CBC
HCT: 32.8 % — ABNORMAL LOW (ref 36.0–46.0)
Hemoglobin: 11.2 g/dL — ABNORMAL LOW (ref 12.0–15.0)
MCH: 29.9 pg (ref 26.0–34.0)
MCHC: 34.1 g/dL (ref 30.0–36.0)
MCV: 87.7 fL (ref 78.0–100.0)
Platelets: 138 10*3/uL — ABNORMAL LOW (ref 150–400)
RBC: 3.74 MIL/uL — ABNORMAL LOW (ref 3.87–5.11)
RDW: 12.6 % (ref 11.5–15.5)
WBC: 9.8 10*3/uL (ref 4.0–10.5)

## 2011-03-18 LAB — DIFFERENTIAL
Basophils Absolute: 0 10*3/uL (ref 0.0–0.1)
Basophils Relative: 0 % (ref 0–1)
Eosinophils Absolute: 0.1 10*3/uL (ref 0.0–0.7)
Eosinophils Relative: 1 % (ref 0–5)
Lymphocytes Relative: 24 % (ref 12–46)
Lymphs Abs: 2.4 10*3/uL (ref 0.7–4.0)
Monocytes Absolute: 0.6 10*3/uL (ref 0.1–1.0)
Monocytes Relative: 7 % (ref 3–12)
Neutro Abs: 6.6 10*3/uL (ref 1.7–7.7)
Neutrophils Relative %: 68 % (ref 43–77)

## 2011-03-18 LAB — BASIC METABOLIC PANEL
BUN: 5 mg/dL — ABNORMAL LOW (ref 6–23)
CO2: 29 mEq/L (ref 19–32)
Calcium: 8.8 mg/dL (ref 8.4–10.5)
Chloride: 99 mEq/L (ref 96–112)
Creatinine, Ser: <0.47 mg/dL — ABNORMAL LOW (ref 0.50–1.10)
Glucose, Bld: 254 mg/dL — ABNORMAL HIGH (ref 70–99)
Potassium: 4.1 mEq/L (ref 3.5–5.1)
Sodium: 135 mEq/L (ref 135–145)

## 2011-03-18 LAB — TYPE AND SCREEN
ABO/RH(D): A NEG
Antibody Screen: NEGATIVE
Unit division: 0
Unit division: 0

## 2011-03-18 LAB — GLUCOSE, CAPILLARY
Glucose-Capillary: 217 mg/dL — ABNORMAL HIGH (ref 70–99)
Glucose-Capillary: 261 mg/dL — ABNORMAL HIGH (ref 70–99)
Glucose-Capillary: 274 mg/dL — ABNORMAL HIGH (ref 70–99)
Glucose-Capillary: 350 mg/dL — ABNORMAL HIGH (ref 70–99)

## 2011-03-18 MED ORDER — HYDROMORPHONE 0.3 MG/ML IV SOLN
INTRAVENOUS | Status: AC
  Filled 2011-03-18: qty 25

## 2011-03-18 NOTE — Progress Notes (Signed)
Physical Therapy Treatment Patient Name: Brandy Fisher [id number] Date: 03/18/2011 Problem List: There is no problem list on file for this patient.  Past Medical History:  Past Medical History  Diagnosis Date  . Diabetes mellitus    Past Surgical History:  Past Surgical History  Procedure Date  . Cesarean section   . Breast surgery     Left breast biopsy, negative  . Cholecystectomy    Precautions/Restrictions  Precautions Precautions: Posterior Hip Precaution Booklet Issued: Yes (comment) Required Braces or Orthoses: No Restrictions Weight Bearing Restrictions: Yes RLE Weight Bearing: Touchdown weight bearing Mobility (including Balance) Bed Mobility Bed Mobility: No Transfers Transfers: Yes Sit to Stand: 4: Min assist;From elevated surface;With upper extremity assist Sit to Stand Details (indicate cue type and reason):  needs assist to transfer weight anteriorly Stand to Sit: 5: Supervision Stand Pivot Transfers: 5: Supervision Ambulation/Gait Ambulation/Gait: Yes Ambulation/Gait Assistance: 4: Min assist Ambulation Distance (Feet): 15 Feet Assistive device: Rolling walker Gait Pattern: Decreased step length - right;Decreased hip/knee flexion - right;Antalgic;Lateral trunk lean to right Gait velocity: slow, labored Stairs: No (pt now states step at home is about 10" high) Wheelchair Mobility Wheelchair Mobility: No    Exercise  Total Joint Exercises Ankle Circles/Pumps: AROM;10 reps;Both;Seated Quad Sets: AROM;Both;10 reps;Seated Gluteal Sets: AROM;Both;10 reps;Seated Short Arc Quad: AAROM;Right;10 reps;Seated Heel Slides: AAROM;Right;10 reps;Seated Hip ABduction/ADduction: AAROM;Right;10 reps;Seated Long Arc Quad: AROM;Right;10 reps;Seated General Exercises - Lower Extremity Ankle Circles/Pumps: AROM;10 reps;Both;Seated Quad Sets: AROM;Both;10 reps;Seated Gluteal Sets: AROM;Both;10 reps;Seated Short Arc Quad: AAROM;Right;10 reps;Seated Long Arc Quad:  AROM;Right;10 reps;Seated Heel Slides: AAROM;Right;10 reps;Seated Hip ABduction/ADduction: AAROM;Right;10 reps;Seated Low Level/ICU Exercises Ankle Circles/Pumps: AROM;10 reps;Both;Seated Quad Sets: AROM;Both;10 reps;Seated Short Arc Quad: AAROM;Right;10 reps;Seated Hip ABduction/ADduction: AAROM;Right;10 reps;Seated Heel Slides: AAROM;Right;10 reps;Seated  End of Session PT - End of Session Equipment Utilized During Treatment: Gait belt Activity Tolerance: Patient tolerated treatment well Patient left: in chair;with call bell in reach General Behavior During Session: [facility] for tasks performed Cognition: [facility] for tasks performed PT Assessment/Plan  PT - Assessment/Plan Comments on Treatment Session: much improved cooperation today, significant decrease in overall pain-should be able to transfer to home PT Plan: Discharge plan remains appropriate PT Frequency: Min 6X/week Follow Up Recommendations: Home health PT;Skilled nursing facility Equipment Recommended: Rolling walker with 5" wheels;3 in 1 bedside comode PT Goals  Acute Rehab PT Goals PT Goal: Stand - Progress: Revised (modified due to lack of progress/goal met) (revise goal to mod independence) PT Goal: Ambulate - Progress: Revised (modified due to lack of progress/goal met) (revise goal to ambulate 16-50 feet) Pt will Go Up / Down Stairs: 1-2 stairs;with min assist;with rolling walker  [name] 03/18/2011, 9:46 AM

## 2011-03-18 NOTE — Progress Notes (Signed)
She is afebrile.  Neurovascular is intact.  Her pain is controlled.    Vital signs normal.  Labs HGB stable.  She did well in physical therapy yesterday and will have it again today.  Leg lengths are equal.  The hemovac drain was removed and she will have the foley discontinued shortly.  Post hospital plans underway and we should find out about her Medicaid status soon.

## 2011-03-18 NOTE — Progress Notes (Signed)
Physical Therapy Treatment Patient Name: Brandy Fisher [id number] Date: 03/18/2011 TIME: [id number] Problem List:  Patient Active Problem List  Diagnoses  . Hip fracture, right  . Diabetes mellitus   Past Medical History:  Past Medical History  Diagnosis Date  . Diabetes mellitus    Past Surgical History:  Past Surgical History  Procedure Date  . Cesarean section   . Breast surgery     Left breast biopsy, negative  . Cholecystectomy    Precautions/Restrictions  Precautions Precautions: Posterior Hip Precaution Booklet Issued: Yes (comment) Required Braces or Orthoses: No Restrictions Weight Bearing Restrictions: Yes RLE Weight Bearing: Touchdown weight bearing Mobility (including Balance) Bed Mobility Bed Mobility: No Sit to Supine - Right: 4: Min assist Sit to Supine - Right Details (indicate cue type and reason): assistance with LEs Transfers Transfers: Yes Sit to Stand: 4: Min assist;From elevated surface;With upper extremity assist Sit to Stand Details (indicate cue type and reason):  needs assist to transfer weight anteriorly Stand to Sit: 5: Supervision Stand to Sit Details: elevated surface Stand Pivot Transfers: 5: Supervision Stand Pivot Transfer Details (indicate cue type and reason): recliner<>BSC Ambulation/Gait Ambulation/Gait: Yes Ambulation/Gait Assistance: 5: Supervision Ambulation Distance (Feet): 15 Feet Assistive device: Rolling walker Gait Pattern: Step-to pattern Gait velocity: very slow Stairs: No Wheelchair Mobility Wheelchair Mobility: No    Exercise  Total Joint Exercises Ankle Circles/Pumps: Both;20 reps Quad Sets: AROM;Both;10 reps;Seated Gluteal Sets: Both;10 reps Short Arc Quad: AAROM;Right;10 reps;Seated Heel Slides: Both;10 reps Hip ABduction/ADduction: AAROM;Right;10 reps;Seated Long Arc Quad: Right;10 reps General Exercises - Lower Extremity Ankle Circles/Pumps: Both;20 reps Quad Sets: AROM;Both;10 reps;Seated Gluteal  Sets: Both;10 reps Short Arc Quad: AAROM;Right;10 reps;Seated Long Arc Quad: Right;10 reps Heel Slides: Both;10 reps Hip ABduction/ADduction: AAROM;Right;10 reps;Seated Low Level/ICU Exercises Ankle Circles/Pumps: Both;20 reps Quad Sets: AROM;Both;10 reps;Seated Short Arc Quad: AAROM;Right;10 reps;Seated Hip ABduction/ADduction: AAROM;Right;10 reps;Seated Heel Slides: Both;10 reps  End of Session PT - End of Session Equipment Utilized During Treatment:  (RW) Activity Tolerance: Patient tolerated treatment well Patient left: in bed;with call bell in reach (nursing present) General Behavior During Session: [facility] for tasks performed Cognition: [facility] for tasks performed PT Assessment/Plan  PT - Assessment/Plan Comments on Treatment Session: again pt much improved from yesterday;able to get from recliner<>BSC with supervision PT Plan: Discharge plan remains appropriate PT Frequency: Min 6X/week Follow Up Recommendations: Home health PT;Skilled nursing facility Equipment Recommended: Rolling walker with 5" wheels;3 in 1 bedside comode PT Goals  Acute Rehab PT Goals PT Goal: Supine/Side to Sit - Progress: Met PT Goal: Stand - Progress: Progressing toward goal PT Goal: Ambulate - Progress: Progressing toward goal Pt will Go Up / Down Stairs: 1-2 stairs;with min assist;with rolling walker  Brandy Fisher 03/18/2011, 12:08 PM

## 2011-03-18 NOTE — Progress Notes (Signed)
Inpatient Diabetes Program Recommendations  AACE/ADA: New Consensus Statement on Inpatient Glycemic Control (2009)  Target Ranges:  Prepandial:   less than 140 mg/dL      Peak postprandial:   less than 180 mg/dL (1-2 hours)      Critically ill patients:  140 - 180 mg/dL   Reason for Visit: Elevated glucose:  217, 350 mg/dL  Inpatient Diabetes Program Recommendations Insulin - Basal: Add Lantus 15 units daily  Note: Consider consult by Dr. Fransico Him due to elevated glucose and elevated HgbA1c 9.8%

## 2011-03-18 NOTE — Consult Note (Signed)
Subjective: No new events/complaints.  Objective: Vital signs in last 24 hours: Temp:  [97.9 F (36.6 C)-99.2 F (37.3 C)] 98.3 F (36.8 C) (09/12 0553) Pulse Rate:  [86-89] 89  (09/12 0553) Resp:  [16-20] 20  (09/12 0800) BP: (116-138)/(67-72) 123/69 mmHg (09/12 0553) SpO2:  [93 %-99 %] 93 % (09/12 0800) Weight change:  Last BM Date: 03/16/11  Intake/Output from previous day: 09/11 0701 - 09/12 0700 In: 450 [P.O.:450] Out: 2810 [Urine:2725; Drains:85]     Physical Exam: General: Alert, awake, oriented x3, in no acute distress. HEENT: No bruits, no goiter. Heart: Regular rate and rhythm, without murmurs, rubs, gallops. Lungs: Clear to auscultation bilaterally. Abdomen: Soft, nontender, nondistended, positive bowel sounds. Extremities: No clubbing cyanosis or edema with positive pedal pulses. Neuro: Grossly intact, nonfocal.    Lab Results:  [facility] 03/18/11 0454 03/17/11 0552  WBC 9.8 10.8*  HGB 11.2* 11.9*  HCT 32.8* 34.5*  PLT 138* 137*    Basename 03/18/11 0454 03/17/11 0552  NA 135 133*  K 4.1 3.6  CL 99 97  CO2 29 29  GLUCOSE 254* 255*  BUN 5* 6  CREATININE <0.47* <0.47*  CALCIUM 8.8 8.4   No results found for this or any previous visit (from the past 240 hour(s)).   Studies/Results: Dg Hip Portable 1 View Right  03/16/2011  *RADIOLOGY REPORT*  Clinical Data: Right femoral neck fracture post hip replacement  PORTABLE RIGHT HIP - 1 VIEW  Comparison: Portable exam 1441 hours compared to 03/15/2011  Findings: Components of right hip prosthesis now identified in expected positions. Surgical drain and expected postsurgical soft tissue changes identified. No fracture, dislocation or acute complications seen.  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  Original Report Authenticated By: Lollie Marrow, M.D.    Medications: Scheduled Meds:   . albuterol  2.5 mg Nebulization QID  . ceFAZolin (ANCEF) IV  1 g Intravenous 60 min Pre-Op  .  glipiZIDE  10 mg Oral BID AC  . HYDROmorphone PCA 0.3 mg/mL   Intravenous Q4H  . influenza  inactive virus vaccine  0.5 mL Intramuscular Once  . insulin aspart  0-15 Units Subcutaneous TID WC  . insulin aspart  0-5 Units Subcutaneous QHS  . rivaroxaban  10 mg Oral Daily  . zolpidem  5 mg Oral QHS  . zolpidem  5 mg Oral QHS   Continuous Infusions:   . 0.9 % NaCl with KCl 20 mEq / L 1,000 mL (03/17/11 1952)  . lactated ringers 150 mL/hr (03/16/11 1142)   PRN Meds:.acetaminophen, albuterol, diphenhydrAMINE, diphenhydrAMINE, fentaNYL, HYDROmorphone, magnesium hydroxide, midazolam, naloxone, ondansetron (ZOFRAN) IV, promethazine, sodium chloride  Assessment/Plan:  Principal Problem:  *Hip fracture, right Active Problems:  Diabetes mellitus  #1 right hip fracture: Status post repair by Dr. Hilda Lias. As per orthopedics.  #2 diabetes: CBGs are improving with increased dose of glipizide. Will not make any further changes at the moment.  #3 disposition: Initial plan was for skilled nursing facility, however as per PT notes today patient seems to be improving to the point where discharge home may be a possibility.   LOS: 3 days   HERNANDEZ ACOSTA,[name] 03/18/2011, 10:26 AM

## 2011-03-18 NOTE — Consult Note (Signed)
CSW spoke with Kathie Rhodes this morning who is planning to assist pt with Medicaid application today.  Per Debarah Crape, pt may be able to return home.  Pt has bed offer at [facility] and letter of guarantee has been submitted if SNF is necessary.  Plan is to await PT treatment tomorrow to make determination on appropriate d/c plan. CM updated.   Karn Cassis

## 2011-03-18 NOTE — Anesthesia Post-op Follow-up Note (Signed)
Follow up done 9/11.

## 2011-03-18 NOTE — Addendum Note (Signed)
Addendum  created 03/18/11 0706 by Glynn Octave   Modules edited:Anesthesia Events, Notes Section

## 2011-03-19 LAB — CBC
HCT: 30.7 % — ABNORMAL LOW (ref 36.0–46.0)
Hemoglobin: 10.6 g/dL — ABNORMAL LOW (ref 12.0–15.0)
MCH: 30.4 pg (ref 26.0–34.0)
MCHC: 34.5 g/dL (ref 30.0–36.0)
MCV: 88 fL (ref 78.0–100.0)
Platelets: 145 10*3/uL — ABNORMAL LOW (ref 150–400)
RBC: 3.49 MIL/uL — ABNORMAL LOW (ref 3.87–5.11)
RDW: 12.7 % (ref 11.5–15.5)
WBC: 9.1 10*3/uL (ref 4.0–10.5)

## 2011-03-19 LAB — DIFFERENTIAL
Basophils Absolute: 0 10*3/uL (ref 0.0–0.1)
Basophils Relative: 0 % (ref 0–1)
Eosinophils Absolute: 0.3 10*3/uL (ref 0.0–0.7)
Eosinophils Relative: 3 % (ref 0–5)
Lymphocytes Relative: 28 % (ref 12–46)
Lymphs Abs: 2.5 10*3/uL (ref 0.7–4.0)
Monocytes Absolute: 0.6 10*3/uL (ref 0.1–1.0)
Monocytes Relative: 6 % (ref 3–12)
Neutro Abs: 5.7 10*3/uL (ref 1.7–7.7)
Neutrophils Relative %: 63 % (ref 43–77)

## 2011-03-19 LAB — GLUCOSE, CAPILLARY
Glucose-Capillary: 235 mg/dL — ABNORMAL HIGH (ref 70–99)
Glucose-Capillary: 308 mg/dL — ABNORMAL HIGH (ref 70–99)
Glucose-Capillary: 293 mg/dL — ABNORMAL HIGH (ref 70–99)
Glucose-Capillary: 251 mg/dL — ABNORMAL HIGH (ref 70–99)

## 2011-03-19 LAB — BASIC METABOLIC PANEL
BUN: 8 mg/dL (ref 6–23)
CO2: 30 mEq/L (ref 19–32)
Calcium: 9 mg/dL (ref 8.4–10.5)
Chloride: 98 mEq/L (ref 96–112)
Creatinine, Ser: <0.47 mg/dL — ABNORMAL LOW (ref 0.50–1.10)
Glucose, Bld: 237 mg/dL — ABNORMAL HIGH (ref 70–99)
Potassium: 4.3 mEq/L (ref 3.5–5.1)
Sodium: 134 mEq/L — ABNORMAL LOW (ref 135–145)

## 2011-03-19 MED ORDER — OXYCODONE-ACETAMINOPHEN 5-325 MG PO TABS
1.0000 | ORAL_TABLET | ORAL | Status: DC | PRN
  Administered 2011-03-19 – 2011-03-20 (×5): 1 via ORAL
  Filled 2011-03-19 (×5): qty 1

## 2011-03-19 MED ORDER — INSULIN GLARGINE 100 UNIT/ML ~~LOC~~ SOLN
10.0000 [IU] | Freq: Every day | SUBCUTANEOUS | Status: DC
  Administered 2011-03-19: 10 [IU] via SUBCUTANEOUS
  Filled 2011-03-19: qty 3

## 2011-03-19 NOTE — Progress Notes (Signed)
She is afebrile.  Hgb 10.6.  Blood sugars still remain elevated.  Her pain is controlled.  I will stop the PCA today and begin oral Percocet for pain.  She did well in physical therapy yesterday and that will continue today.  I would like for her to go to skilled nursing facility post discharge here.  Hopefully that will be finalized today for transfer tomorrow.  She is awaiting final Medicaid approval as best as I understand.  I have talked to her about diabetes management after she comes home.  She agrees to this.  Wound is OK.  Leg lengths are normal.  NV is intact.

## 2011-03-19 NOTE — Progress Notes (Signed)
Subjective: No new events/complaints.  Objective: Vital signs in last 24 hours: Temp:  [97.9 F (36.6 C)-98.3 F (36.8 C)] 98 F (36.7 C) (09/13 0531) Pulse Rate:  [89-101] 96  (09/13 0531) Resp:  [16-20] 18  (09/13 0756) BP: (126-147)/(73-75) 136/74 mmHg (09/13 0531) SpO2:  [94 %-99 %] 95 % (09/13 0756) Weight change:  Last BM Date: 03/16/11  Intake/Output from previous day: 09/12 0701 - 09/13 0700 In: 2800 [P.O.:900; I.V.:1900] Out: 1050 [Urine:1050] Total I/O In: 200 [P.O.:200] Out: -    Physical Exam: General: Alert, awake, oriented x3, in no acute distress. HEENT: No bruits, no goiter. Heart: Regular rate and rhythm, without murmurs, rubs, gallops. Lungs: Clear to auscultation bilaterally. Abdomen: Soft, nontender, nondistended, positive bowel sounds. Extremities: No clubbing cyanosis or edema with positive pedal pulses. Neuro: Grossly intact, nonfocal.    Lab Results: Basic Metabolic Panel:  Basename 03/19/11 0547 03/18/11 0454  NA 134* 135  K 4.3 4.1  CL 98 99  CO2 30 29  GLUCOSE 237* 254*  BUN 8 5*  CREATININE <0.47* <0.47*  CALCIUM 9.0 8.8  MG -- --  PHOS -- --   Liver Function Tests:  Basename 03/17/11 0552  AST 21  ALT 16  ALKPHOS 89  BILITOT 0.8  PROT 6.0  ALBUMIN 2.7*   No results found for this basename: LIPASE:2,AMYLASE:2 in the last 72 hours No results found for this basename: AMMONIA:2 in the last 72 hours CBC:  Basename 03/19/11 0547 03/18/11 0454  WBC 9.1 9.8  NEUTROABS 5.7 6.6  HGB 10.6* 11.2*  HCT 30.7* 32.8*  MCV 88.0 87.7  PLT 145* 138*   Cardiac Enzymes: No results found for this basename: CKTOTAL:3,CKMB:3,CKMBINDEX:3,TROPONINI:3 in the last 72 hours BNP: No results found for this basename: POCBNP:3 in the last 72 hours D-Dimer: No results found for this basename: DDIMER:2 in the last 72 hours CBG:  Basename 03/19/11 1107 03/19/11 0736 03/18/11 2331 03/18/11 1656 03/18/11 1121 03/18/11 0724  GLUCAP 308* 235*  274* 261* 350* 217*   Hemoglobin A1C:  Basename 03/17/11 0925  HGBA1C 9.8*   Fasting Lipid Panel: No results found for this basename: CHOL,HDL,LDLCALC,TRIG,CHOLHDL,LDLDIRECT in the last 72 hours Thyroid Function Tests: No results found for this basename: TSH,T4TOTAL,FREET4,T3FREE,THYROIDAB in the last 72 hours Anemia Panel: No results found for this basename: VITAMINB12,FOLATE,FERRITIN,TIBC,IRON,RETICCTPCT in the last 72 hours Urine Drug Screen:  Alcohol Level: No results found for this basename: ETH:2 in the last 72 hours Urinalysis:  Misc. Labs:    Studies/Results: No results found.  Medications: Scheduled Meds:   . albuterol  2.5 mg Nebulization QID  . glipiZIDE  10 mg Oral BID AC  . insulin aspart  0-15 Units Subcutaneous TID WC  . insulin aspart  0-5 Units Subcutaneous QHS  . rivaroxaban  10 mg Oral Daily  . zolpidem  5 mg Oral QHS  . zolpidem  5 mg Oral QHS  . DISCONTD: ceFAZolin (ANCEF) IV  1 g Intravenous 60 min Pre-Op  . DISCONTD: HYDROmorphone PCA 0.3 mg/mL   Intravenous Q4H   Continuous Infusions:   . 0.9 % NaCl with KCl 20 mEq / L 50 mL/hr at 03/18/11 1606  . lactated ringers 150 mL/hr (03/16/11 1142)   PRN Meds:.acetaminophen, albuterol, diphenhydrAMINE, diphenhydrAMINE, fentaNYL, magnesium hydroxide, midazolam, oxyCODONE-acetaminophen, promethazine, DISCONTD: HYDROmorphone, DISCONTD: naloxone, DISCONTD: ondansetron (ZOFRAN) IV, DISCONTD: sodium chloride  Assessment/Plan:  Principal Problem:  *Hip fracture, right Active Problems:  Diabetes mellitus  #1 right hip fracture: Status post repair, as per Dr. Hilda Lias.  #  2 diabetes mellitus: CBGs continue to be elevated. At this point we'll add Lantus 10 units at bedtime and assess CBG response.  #3 disposition: Plan is for nursing home.   LOS: 4 days   Brandy Fisher,Brandy Fisher 03/19/2011, 11:42 AM

## 2011-03-19 NOTE — Progress Notes (Signed)
Physical Therapy Treatment Patient Name: Brandy Fisher WGNFA'O Date: 03/19/2011 [id number] Problem List:  Patient Active Problem List  Diagnoses  . Hip fracture, right  . Diabetes mellitus   Past Medical History:  Past Medical History  Diagnosis Date  . Diabetes mellitus    Past Surgical History:  Past Surgical History  Procedure Date  . Cesarean section   . Breast surgery     Left breast biopsy, negative  . Cholecystectomy    Precautions/Restrictions  Precautions Precautions: Posterior Hip Precaution Booklet Issued: Yes (comment) Required Braces or Orthoses: No Restrictions Weight Bearing Restrictions: No RLE Weight Bearing: Partial weight bearing Mobility (including Balance) Bed Mobility Supine to Sit: 6: Modified independent (Device/Increase time) Sitting - Scoot to Edge of Bed: 6: Modified independent (Device/Increase time) Transfers Sit to Stand: 6: Modified independent (Device/Increase time) Sit to Stand Details (indicate cue type and reason): from elevated surface Stand to Sit: 6: Modified independent (Device/Increase time) Ambulation/Gait Ambulation/Gait: Yes Ambulation/Gait Assistance: 4: Min assist Ambulation/Gait Assistance Details (indicate cue type and reason): vc's to advance surgery leg first Ambulation Distance (Feet): 25 Feet Assistive device: Rolling walker Gait Pattern: Step-to pattern;Decreased stance time - right;Decreased hip/knee flexion - right Gait velocity: extremely slow Stairs: No Wheelchair Mobility Wheelchair Mobility: No    Exercise  Total Joint Exercises Ankle Circles/Pumps: Both;10 reps Long Arc Quad: Both;20 reps General Exercises - Lower Extremity Ankle Circles/Pumps: Both;10 reps Long Arc Quad: Both;20 reps Toe Raises: Right;10 reps Heel Raises: 10 reps;Right Low Level/ICU Exercises Ankle Circles/Pumps: Both;10 reps  End of Session PT - End of Session Equipment Utilized During Treatment: Gait belt  (RW) Activity Tolerance: Patient limited by fatigue;Patient limited by pain Patient left: in bed;with call bell in reach;with bed alarm set General Behavior During Session: [facility] for tasks performed Cognition: [facility] for tasks performed PT Assessment/Plan  PT - Assessment/Plan Comments on Treatment Session: Pt not able to ambulate further distance than this morning due to pain of RLE and fatigue;tolerated execises well;pt is Mod A with sit<>supine for LEs; needs bed elevated to stand; HOB at 70 degress and use of hand rails  for supine<>sit<>supine  Follow Up Recommendations: Skilled nursing facility PT Goals  Acute Rehab PT Goals PT Goal: Supine/Side to Sit - Progress: Met PT Goal: Sit to Supine/Side - Progress: Met PT Goal: Stand - Progress: Met PT Goal: Ambulate - Progress: Progressing toward goal PT Goal: Up/Down Stairs - Progress: Progressing toward goal  Brandy Fisher 03/19/2011, 12:19 PM

## 2011-03-19 NOTE — Plan of Care (Signed)
Problem: Phase I Progression Outcomes Goal: OOB as tolerated unless otherwise ordered Outcome: Completed/Met Date Met:  03/19/11 Up to chair with physical therapy Goal: Initial discharge plan identified Outcome: Progressing snf placement at d/c

## 2011-03-19 NOTE — Progress Notes (Signed)
Physical Therapy Treatment Patient Name: Brandy Fisher ZOXWR'U Date: 03/19/2011 [id number] Problem List:  Patient Active Problem List  Diagnoses  . Hip fracture, right  . Diabetes mellitus   Past Medical History:  Past Medical History  Diagnosis Date  . Diabetes mellitus    Past Surgical History:  Past Surgical History  Procedure Date  . Cesarean section   . Breast surgery     Left breast biopsy, negative  . Cholecystectomy    Precautions/Restrictions  Precautions Precautions: Posterior Hip Precaution Booklet Issued: Yes (comment) Required Braces or Orthoses: No Restrictions Weight Bearing Restrictions: No RLE Weight Bearing: Partial weight bearing Mobility (including Balance) Bed Mobility Supine to Sit: 6: Modified independent (Device/Increase time) Sitting - Scoot to Edge of Bed: 6: Modified independent (Device/Increase time) Transfers Sit to Stand: 6: Modified independent (Device/Increase time) Sit to Stand Details (indicate cue type and reason): from elevated surface Stand to Sit: 6: Modified independent (Device/Increase time) Ambulation/Gait Ambulation/Gait: Yes Ambulation/Gait Assistance: 4: Min assist Ambulation/Gait Assistance Details (indicate cue type and reason): vc's to advance surgery leg first Ambulation Distance (Feet): 25 Feet Assistive device: Rolling walker Gait Pattern: Step-to pattern;Decreased stance time - right;Decreased hip/knee flexion - right Gait velocity: extremely slow Stairs: No Wheelchair Mobility Wheelchair Mobility: No    Exercise  Total Joint Exercises Ankle Circles/Pumps: 20 reps;Right Long Arc Quad: Both;20 reps General Exercises - Lower Extremity Ankle Circles/Pumps: 20 reps;Right Long Arc Quad: Both;20 reps Toe Raises: 10 reps;Right Heel Raises: 10 reps;Right Low Level/ICU Exercises Ankle Circles/Pumps: 20 reps;Right  End of Session PT - End of Session Equipment Utilized During Treatment: Gait belt Activity  Tolerance: Patient tolerated treatment well (extremely slow) Patient left: in chair;with call bell in reach General Behavior During Session: [facility] for tasks performed Cognition: [facility] for tasks performed PT Assessment/Plan  PT - Assessment/Plan Comments on Treatment Session: continued improvement with general mobility and gait distance this morning PT Goals  Acute Rehab PT Goals PT Goal: Supine/Side to Sit - Progress: Met PT Goal: Sit to Supine/Side - Progress: Met PT Goal: Stand - Progress: Met PT Goal: Ambulate - Progress: Met PT Goal: Up/Down Stairs - Progress: Progressing toward goal  Brandy Fisher 03/19/2011, 9:51 AM

## 2011-03-20 ENCOUNTER — Encounter (HOSPITAL_COMMUNITY): Payer: Self-pay | Admitting: Orthopaedic Surgery

## 2011-03-20 LAB — GLUCOSE, CAPILLARY: Glucose-Capillary: 169 mg/dL — ABNORMAL HIGH (ref 70–99)

## 2011-03-20 MED ORDER — INSULIN GLARGINE 100 UNIT/ML ~~LOC~~ SOLN: 10.0000 [IU] | Freq: Every day | SUBCUTANEOUS | Status: DC

## 2011-03-20 MED ORDER — OXYCODONE-ACETAMINOPHEN 5-325 MG PO TABS
1.0000 | ORAL_TABLET | ORAL | Status: DC | PRN
  Administered 2011-03-20: 2 via ORAL
  Administered 2011-03-20: 1 via ORAL
  Filled 2011-03-20: qty 2

## 2011-03-20 MED ORDER — OXYCODONE-ACETAMINOPHEN 5-325 MG PO TABS
ORAL_TABLET | ORAL | Status: AC
  Filled 2011-03-20: qty 1

## 2011-03-20 MED ORDER — NICOTINE 21 MG/24HR TD PT24
21.0000 mg | MEDICATED_PATCH | Freq: Every day | TRANSDERMAL | Status: DC
  Administered 2011-03-20: 21 mg via TRANSDERMAL
  Filled 2011-03-20: qty 1

## 2011-03-20 MED ORDER — INSULIN ASPART 100 UNIT/ML ~~LOC~~ SOLN: 0.0000 [IU] | Freq: Every day | SUBCUTANEOUS | Status: DC

## 2011-03-20 MED ORDER — INSULIN ASPART 100 UNIT/ML ~~LOC~~ SOLN: 0.0000 [IU] | Freq: Three times a day (TID) | SUBCUTANEOUS | Status: DC

## 2011-03-20 MED ORDER — GLIPIZIDE 10 MG PO TABS: 10.0000 mg | ORAL_TABLET | Freq: Two times a day (BID) | ORAL | Status: DC

## 2011-03-20 MED ORDER — OXYCODONE-ACETAMINOPHEN 5-325 MG PO TABS: 2.0000 | ORAL_TABLET | ORAL | Status: AC | PRN

## 2011-03-20 MED ORDER — RIVAROXABAN 10 MG PO TABS: 10.0000 mg | ORAL_TABLET | Freq: Every day | ORAL | Status: DC

## 2011-03-20 NOTE — Progress Notes (Signed)
She is afebrile.  She took the oral medicine for pain and the pain is controlled.  She is making good progress with physical therapy.  I will discharge to skilled unit today.  I will see her in the office in one month.  She will have PT at the skilled unit.  Staples are to be removed September 27th.

## 2011-03-20 NOTE — Discharge Summary (Signed)
Physician Discharge Summary  Patient ID: Brandy Fisher MRN: [id number] DOB/AGE: 57-Mar-1957 57 y.o.  Admit date: 03/15/2011 Discharge date: 03/20/2011  Admission Diagnoses: Right subcapital fracture of the right hip, displaced; diabetes mellitus, poorly controlled.  Discharge Diagnoses:  Principal Problem:  *Hip fracture, right Active Problems:  Diabetes mellitus   Discharged Condition: good  Hospital Course: She was seen in the ER initially.  She had a displaced fracture of the right hip, subcapital.  She was told of the need for surgery.  She was seen and evaluated by the hospitalist for diabetes control. Initial labs were normal except for the blood sugars.  Chest X-ray was negative as was the EKG. She had surgery of the right hip with a bipolar hip prosthesis the following day.  She tolerated the procedure well.   Her blood sugars remained elevated but were controlled by insulin.  The hospitalist monitored them closely.  She was seen by physical therapy the following day after surgery.  Over the next several days she progressed nicely with the therapist.  She can bear weight as tolerated with a walker.  She will need to have an abduction pillow between the legs when in bed.   The foley and hemovac were removed the second post op day.  Her wound has remained looking good.  She will need to have sutures removed on September 27th and have steristrips applied to the wound.  Her pain has been controlled initially by PCA morphine IV and then oral Percocet 5/325.  Hip precautions have been discussed with her.  She has been told of diabetes management post op.  She has remained afebrile.  By time of discharge she was using a walker well.  Her Hgb has remained stable.  She will need to continue the rivaoxaban daily for one month.  Consults: Hospitalist  Significant Diagnostic Studies: radiology: X-Ray: hip fracture  Treatments: surgery: Bipolar hip replacement on the right  Discharge  Exam: Blood pressure 117/74, pulse 75, temperature 98.2 F (36.8 C), temperature source Oral, resp. rate 18, height 5\' 7"  (1.702 m), weight 87 kg (191 lb 12.8 oz), SpO2 94.00%. Incision/Wound: looks good.  Need to change dressing daily.  Disposition: Home or Self Care Skilled care unit  Discharge Orders    Future Orders Please Complete By Expires   Diet - low sodium heart healthy      Remove staples      Scheduling Instructions:   To be removed at skilled unit September 27th, steristrip wound.   Call MD / Call 911      Comments:   If you experience chest pain or shortness of breath, CALL 911 and be transported to the hospital emergency room.  If you develope a fever above 101 F, pus (white drainage) or increased drainage or redness at the wound, or calf pain, call your surgeon's office.   Constipation Prevention      Comments:   Drink plenty of fluids.  Prune juice may be helpful.  You may use a stool softener, such as Colace (over the counter) 100 mg twice a day.  Use MiraLax (over the counter) for constipation as needed.   Increase activity slowly as tolerated      Weight Bearing as taught in Physical Therapy      Comments:   Use a walker or crutches as instructed.   Driving restrictions      Comments:   No driving for 12 weeks   Do not sit on low chairs, stoools  or toilet seats, as it may be difficult to get up from low surfaces      Scheduling Instructions:   Use Abduction pillow in bed for the next month.   Discharge wound care:      Comments:   If you have a hip bandage, keep it clean and dry.  Change your bandage as instructed by your health care providers.  If your bandage has been discontinued, keep your incision clean and dry.  Pat dry after bathing.  DO NOT put lotion or powder on your incision.     Current Discharge Medication List    START taking these medications   Details  glipiZIDE (GLUCOTROL) 10 MG tablet Take 1 tablet (10 mg total) by mouth 2 (two) times daily  before a meal. Qty: 60 tablet, Refills: 0    !! insulin aspart (NOVOLOG) 100 UNIT/ML injection Inject 0-15 Units into the skin 3 (three) times daily with meals. Qty: 10 mL, Refills: 0    !! insulin aspart (NOVOLOG) 100 UNIT/ML injection Inject 0-5 Units into the skin at bedtime. Qty: 10 mL, Refills: 0    insulin glargine (LANTUS) 100 UNIT/ML injection Inject 10 Units into the skin at bedtime. Qty: 10 mL, Refills: 0    oxyCODONE-acetaminophen (PERCOCET) 5-325 MG per tablet Take 2 tablets by mouth every 4 (four) hours as needed. Qty: 90 tablet, Refills: 0    rivaroxaban (XARELTO) 10 MG TABS tablet Take 1 tablet (10 mg total) by mouth daily. Qty: 30 tablet, Refills: 0     !! - Potential duplicate medications found. Please discuss with provider.    CONTINUE these medications which have NOT CHANGED   Details  metFORMIN (GLUCOPHAGE) 500 MG tablet Take 500 mg by mouth 2 (two) times daily with a meal. She is currently not taking regularly.  She has no family doctor.  She gets medicine "now and then" by coming to ER and getting a Rx.  She has not been seen for her diabetes for at least three to four years.  She has had diabetes for nine years.  She lost her Medicaid and is not being followed.       Follow-up Information    Follow up with Brandy Fisher. Make an appointment in 1 month. (X-rays of the right hip prior to appointment.)    Contact information:   [address] [place] [address] [phone]          Signed: Darreld Fisher 03/20/2011, 7:27 AM

## 2011-03-20 NOTE — Consult Note (Signed)
Pt had been for possible SNF.  CSW explained to Pt that the only bed offered was in [place], [place].  Pt has made the decision to go home with home health.  She has been referred to care management for D/C planning and home health needs.

## 2011-03-20 NOTE — Progress Notes (Signed)
Subjective: No new events/complaints. Is scheduled for discharge to skilled nursing facility today as per Dr. Hilda Lias.  Objective: Vital signs in last 24 hours: Temp:  [98.2 F (36.8 C)-99 F (37.2 C)] 98.2 F (36.8 C) (09/14 0554) Pulse Rate:  [75-87] 75  (09/14 0554) Resp:  [18-19] 18  (09/14 0554) BP: (117-132)/(74-77) 117/74 mmHg (09/14 0554) SpO2:  [93 %-95 %] 94 % (09/14 0554) Weight change:  Last BM Date: 03/17/11  Intake/Output from previous day: 09/13 0701 - 09/14 0700 In: 1080 [P.O.:1080] Out: 875 [Urine:875] Total I/O In: 240 [P.O.:240] Out: -    Physical Exam: General: Alert, awake, oriented x3, in no acute distress. HEENT: No bruits, no goiter. Heart: Regular rate and rhythm, without murmurs, rubs, gallops. Lungs: Clear to auscultation bilaterally. Abdomen: Soft, nontender, nondistended, positive bowel sounds. Extremities: No clubbing cyanosis or edema with positive pedal pulses. Neuro: Grossly intact, nonfocal.    Lab Results: Basic Metabolic Panel:  Basename 03/19/11 0547 03/18/11 0454  NA 134* 135  K 4.3 4.1  CL 98 99  CO2 30 29  GLUCOSE 237* 254*  BUN 8 5*  CREATININE <0.47* <0.47*  CALCIUM 9.0 8.8  MG -- --  PHOS -- --   Liver Function Tests: No results found for this basename: AST:2,ALT:2,ALKPHOS:2,BILITOT:2,PROT:2,ALBUMIN:2 in the last 72 hours No results found for this basename: LIPASE:2,AMYLASE:2 in the last 72 hours No results found for this basename: AMMONIA:2 in the last 72 hours CBC:  Basename 03/19/11 0547 03/18/11 0454  WBC 9.1 9.8  NEUTROABS 5.7 6.6  HGB 10.6* 11.2*  HCT 30.7* 32.8*  MCV 88.0 87.7  PLT 145* 138*   Cardiac Enzymes: No results found for this basename: CKTOTAL:3,CKMB:3,CKMBINDEX:3,TROPONINI:3 in the last 72 hours BNP: No results found for this basename: POCBNP:3 in the last 72 hours D-Dimer: No results found for this basename: DDIMER:2 in the last 72 hours CBG:  Basename 03/20/11 0750 03/19/11 2146  03/19/11 1651 03/19/11 1107 03/19/11 0736 03/18/11 2331  GLUCAP 169* 251* 293* 308* 235* 274*   Hemoglobin A1C: No results found for this basename: HGBA1C in the last 72 hours Fasting Lipid Panel: No results found for this basename: CHOL,HDL,LDLCALC,TRIG,CHOLHDL,LDLDIRECT in the last 72 hours Thyroid Function Tests: No results found for this basename: TSH,T4TOTAL,FREET4,T3FREE,THYROIDAB in the last 72 hours Anemia Panel: No results found for this basename: VITAMINB12,FOLATE,FERRITIN,TIBC,IRON,RETICCTPCT in the last 72 hours Urine Drug Screen:  Alcohol Level: No results found for this basename: ETH:2 in the last 72 hours Urinalysis:  Misc. Labs:  No results found for this or any previous visit (from the past 240 hour(s)).  Studies/Results: No results found.  Medications: Scheduled Meds:   . glipiZIDE  10 mg Oral BID AC  . insulin aspart  0-15 Units Subcutaneous TID WC  . insulin aspart  0-5 Units Subcutaneous QHS  . insulin glargine  10 Units Subcutaneous QHS  . oxyCODONE-acetaminophen      . rivaroxaban  10 mg Oral Daily  . zolpidem  5 mg Oral QHS  . zolpidem  5 mg Oral QHS  . DISCONTD: albuterol  2.5 mg Nebulization QID   Continuous Infusions:   . 0.9 % NaCl with KCl 20 mEq / L 50 mL/hr at 03/18/11 1606  . lactated ringers 150 mL/hr (03/16/11 1142)   PRN Meds:.acetaminophen, albuterol, diphenhydrAMINE, diphenhydrAMINE, fentaNYL, magnesium hydroxide, midazolam, oxyCODONE-acetaminophen, promethazine, DISCONTD: oxyCODONE-acetaminophen  Assessment/Plan:  Principal Problem:  *Hip fracture, right: As per orthopedics, status post repair. Active Problems:  Diabetes mellitus: CBGs improved after initiation of Lantus.  Would continue Lantus 10 units injected subcutaneously at bedtime upon discharge, this can be further titrated once at skilled nursing facility.    LOS: 5 days   Brandy Fisher,Brandy Fisher 03/20/2011, 9:47 AM

## 2011-03-20 NOTE — Progress Notes (Signed)
IV removed, site WNL.  Pt given d/c instructions and new prescriptions.  Discussed home care with patient and discussed home medications, patient verbalizes understanding. F/U appointments to be made by pt.  pt states they will keep appts. Pt is A&O and stable at this time. Pt taken to main entrance in wheelchair by staff member and family.

## 2011-04-15 ENCOUNTER — Other Ambulatory Visit (HOSPITAL_COMMUNITY): Payer: Self-pay | Admitting: Orthopaedic Surgery

## 2011-04-15 ENCOUNTER — Ambulatory Visit (HOSPITAL_COMMUNITY)
Admission: RE | Admit: 2011-04-15 | Discharge: 2011-04-15 | Disposition: A | Discharge status: Home / Self Care | Payer: Self-pay | Source: Ambulatory Visit | Attending: Orthopaedic Surgery | Admitting: Orthopaedic Surgery

## 2011-04-15 DIAGNOSIS — S72009A Fracture of unspecified part of neck of unspecified femur, initial encounter for closed fracture: Secondary | ICD-10-CM

## 2011-04-15 DIAGNOSIS — Z96649 Presence of unspecified artificial hip joint: Secondary | ICD-10-CM | POA: Insufficient documentation

## 2011-04-15 DIAGNOSIS — M25559 Pain in unspecified hip: Secondary | ICD-10-CM | POA: Insufficient documentation

## 2011-05-27 ENCOUNTER — Ambulatory Visit (HOSPITAL_COMMUNITY)
Admission: RE | Admit: 2011-05-27 | Discharge: 2011-05-27 | Disposition: A | Discharge status: Home / Self Care | Payer: Self-pay | Source: Ambulatory Visit | Attending: Orthopaedic Surgery | Admitting: Orthopaedic Surgery

## 2011-05-27 ENCOUNTER — Other Ambulatory Visit (HOSPITAL_COMMUNITY): Payer: Self-pay | Admitting: Orthopaedic Surgery

## 2011-05-27 DIAGNOSIS — S72009D Fracture of unspecified part of neck of unspecified femur, subsequent encounter for closed fracture with routine healing: Secondary | ICD-10-CM | POA: Insufficient documentation

## 2011-05-27 DIAGNOSIS — T148XXA Other injury of unspecified body region, initial encounter: Secondary | ICD-10-CM

## 2012-06-21 ENCOUNTER — Encounter (HOSPITAL_COMMUNITY): Payer: Self-pay | Admitting: *Deleted

## 2012-06-21 ENCOUNTER — Emergency Department (HOSPITAL_COMMUNITY)
Admission: EM | Admit: 2012-06-21 | Discharge: 2012-06-22 | Disposition: A | Discharge status: Home / Self Care | Payer: Medicaid Other | Attending: Emergency Medicine | Admitting: Emergency Medicine

## 2012-06-21 DIAGNOSIS — F17200 Nicotine dependence, unspecified, uncomplicated: Secondary | ICD-10-CM | POA: Insufficient documentation

## 2012-06-21 DIAGNOSIS — R51 Headache: Secondary | ICD-10-CM | POA: Insufficient documentation

## 2012-06-21 DIAGNOSIS — E119 Type 2 diabetes mellitus without complications: Secondary | ICD-10-CM | POA: Insufficient documentation

## 2012-06-21 DIAGNOSIS — R590 Localized enlarged lymph nodes: Secondary | ICD-10-CM

## 2012-06-21 DIAGNOSIS — R599 Enlarged lymph nodes, unspecified: Secondary | ICD-10-CM | POA: Insufficient documentation

## 2012-06-21 DIAGNOSIS — Z79899 Other long term (current) drug therapy: Secondary | ICD-10-CM | POA: Insufficient documentation

## 2012-06-21 DIAGNOSIS — Z8739 Personal history of other diseases of the musculoskeletal system and connective tissue: Secondary | ICD-10-CM | POA: Insufficient documentation

## 2012-06-21 DIAGNOSIS — Z794 Long term (current) use of insulin: Secondary | ICD-10-CM | POA: Insufficient documentation

## 2012-06-21 HISTORY — DX: Unspecified osteoarthritis, unspecified site: M19.90

## 2012-06-21 MED ORDER — OXYCODONE-ACETAMINOPHEN 5-325 MG PO TABS: 1.0000 | ORAL_TABLET | ORAL | Status: AC | PRN

## 2012-06-21 MED ORDER — SULFAMETHOXAZOLE-TMP DS 800-160 MG PO TABS
1.0000 | ORAL_TABLET | Freq: Once | ORAL | Status: AC
  Administered 2012-06-21: 1 via ORAL
  Filled 2012-06-21: qty 1

## 2012-06-21 MED ORDER — SULFAMETHOXAZOLE-TRIMETHOPRIM 800-160 MG PO TABS: 1.0000 | ORAL_TABLET | Freq: Two times a day (BID) | ORAL | Status: DC

## 2012-06-21 MED ORDER — OXYCODONE-ACETAMINOPHEN 5-325 MG PO TABS
1.0000 | ORAL_TABLET | Freq: Once | ORAL | Status: AC
  Administered 2012-06-21: 1 via ORAL
  Filled 2012-06-21: qty 1

## 2012-06-21 NOTE — ED Notes (Signed)
Pt reporting knot on back of neck that is painful and causing a headache.  Pt also reporting pain in lower back and left hip.

## 2012-06-21 NOTE — ED Notes (Addendum)
Patient states she started having a sore area on the back of her right neck, states the area is throbbing and is painful to touch, states that the area on her neck is making her head hurt.  Pt states that she is having pain in her right hip that has been there for a long time.  States she feels "all congested," as well.

## 2012-06-21 NOTE — ED Provider Notes (Signed)
History   This chart was scribed for Dione Booze, MD, by Brandy Fisher, ER scribe. The patient was seen in room APA19/APA19 and the patient's care was started at 2230.    CSN: [id number]  Arrival date & time 06/21/12  2042   First MD Initiated Contact with Patient 06/21/12 2230      No chief complaint on file.   (Consider location/radiation/quality/duration/timing/severity/associated sxs/prior treatment) HPI Comments: Brandy Fisher is a 56 y.o. female who presents to the Emergency Department complaining of of a constant, stabbing headache that began last night with an associated mass to the back of the neck that began 2 days ago. She states that the pain is aggravated by turning her head. She reports that she took ibuprofen at home with mild relief.  PCP is [facility].      Past Medical History  Diagnosis Date  . Diabetes mellitus   . Arthritis     Past Surgical History  Procedure Date  . Cesarean section   . Breast surgery     Left breast biopsy, negative  . Cholecystectomy   . Hip arthroplasty 03/16/2011    Procedure: ARTHROPLASTY BIPOLAR HIP;  Surgeon: Darreld Mclean;  Location: [facility];  Service: Orthopedics;  Laterality: Right;    Family History  Problem Relation Age of Onset  . Diabetes Mother     History  Substance Use Topics  . Smoking status: Current Every Day Smoker -- 0.5 packs/day for 25 years    Types: Cigarettes  . Smokeless tobacco: Not on file  . Alcohol Use: No    OB History    Grav Para Term Preterm Abortions TAB SAB Ect Mult Living                  Review of Systems  Neurological: Positive for headaches.  All other systems reviewed and are negative.    Allergies  Morphine and related  Home Medications   Current Outpatient Rx  Name  Route  Sig  Dispense  Refill  . IBUPROFEN 200 MG PO TABS   Oral   Take 600 mg by mouth every 6 (six) hours as needed. FOR PAIN         . INSULIN ASPART 100 UNIT/ML [place] SOLN  Subcutaneous   Inject 20 Units into the skin at bedtime.         Marland Kitchen METFORMIN HCL 500 MG PO TABS   Oral   Take 500 mg by mouth 2 (two) times daily with a meal.          . NAPROXEN 500 MG PO TABS   Oral   Take 500 mg by mouth 2 (two) times daily with a meal.           BP 161/82  Pulse 79  Temp 98.3 F (36.8 C) (Oral)  Resp 16  Ht 5\' 7"  (1.702 m)  Wt 175 lb (79.379 kg)  BMI 27.41 kg/m2  SpO2 98%  Physical Exam  Nursing note and vitals reviewed. Constitutional: She is oriented to person, place, and time. She appears well-developed and well-nourished. No distress.  HENT:  Head: Normocephalic and atraumatic.       She has a 3 by 2 cm tender lymph node to the right occipital area and a non-tender 1x2 cm lymph node.  Eyes: EOM are normal. Pupils are equal, round, and reactive to light.  Neck: Normal range of motion. Neck supple. No tracheal deviation present.  Cardiovascular: Normal rate.   Pulmonary/Chest:  Effort normal. No respiratory distress.  Abdominal: Soft. She exhibits no distension.  Musculoskeletal: Normal range of motion. She exhibits no edema.  Neurological: She is alert and oriented to person, place, and time.  Skin: Skin is warm and dry.  Psychiatric: She has a normal mood and affect. Her behavior is normal.    ED Course  Procedures (including critical care time)  DIAGNOSTIC STUDIES: Oxygen Saturation is 98% on room air, normal by my interpretation.    COORDINATION OF CARE:  22:38- Discussed planned course of treatment with the patient, including antibiotics and pain medication, who is agreeable at this time.  22:45- Medication Orders- oxycodone-acetaminophen (percocet/roxicet) 5-325 mg per tablet 1 tablet- once, sulfamethoxazole-trimethoprim (bactrim ds) 800- 160 mg per tablet 1 tablet- once.    1. Lymphadenopathy, occipital       MDM  She clearly has a painful lymph node in the occipital region. However, I am concerned that this could progress  to an abscess. She will be given a therapeutic trial of Percocet for pain and started on Bactrim.  She got good relief of pain with Percocet and sent home with prescription for same. She is also sent home with prescription for Bactrim. She is to apply warm compresses and she is to be rechecked in 2 days.   I personally performed the services described in this documentation, which was scribed in my presence. The recorded information has been reviewed and is accurate.          Dione Booze, MD 06/22/12 Marlyne Beards

## 2012-07-10 ENCOUNTER — Encounter (HOSPITAL_COMMUNITY): Payer: Self-pay | Admitting: *Deleted

## 2012-07-10 ENCOUNTER — Emergency Department (HOSPITAL_COMMUNITY)
Admission: EM | Admit: 2012-07-10 | Discharge: 2012-07-10 | Disposition: A | Discharge status: Home / Self Care | Payer: Medicaid Other | Attending: Emergency Medicine | Admitting: Emergency Medicine

## 2012-07-10 DIAGNOSIS — L02818 Cutaneous abscess of other sites: Secondary | ICD-10-CM | POA: Insufficient documentation

## 2012-07-10 DIAGNOSIS — Z8739 Personal history of other diseases of the musculoskeletal system and connective tissue: Secondary | ICD-10-CM | POA: Insufficient documentation

## 2012-07-10 DIAGNOSIS — L0211 Cutaneous abscess of neck: Secondary | ICD-10-CM

## 2012-07-10 DIAGNOSIS — Z794 Long term (current) use of insulin: Secondary | ICD-10-CM | POA: Insufficient documentation

## 2012-07-10 DIAGNOSIS — R599 Enlarged lymph nodes, unspecified: Secondary | ICD-10-CM | POA: Insufficient documentation

## 2012-07-10 DIAGNOSIS — E119 Type 2 diabetes mellitus without complications: Secondary | ICD-10-CM | POA: Insufficient documentation

## 2012-07-10 DIAGNOSIS — F17200 Nicotine dependence, unspecified, uncomplicated: Secondary | ICD-10-CM | POA: Insufficient documentation

## 2012-07-10 DIAGNOSIS — Z79899 Other long term (current) drug therapy: Secondary | ICD-10-CM | POA: Insufficient documentation

## 2012-07-10 MED ORDER — LIDOCAINE HCL (PF) 2 % IJ SOLN
INTRAMUSCULAR | Status: AC
  Administered 2012-07-10: 21:00:00
  Filled 2012-07-10: qty 10

## 2012-07-10 MED ORDER — CEPHALEXIN 500 MG PO CAPS: 1000.0000 mg | ORAL_CAPSULE | Freq: Two times a day (BID) | ORAL | Status: DC

## 2012-07-10 MED ORDER — HYDROCODONE-ACETAMINOPHEN 5-325 MG PO TABS: 1.0000 | ORAL_TABLET | ORAL | Status: AC | PRN

## 2012-07-10 NOTE — ED Provider Notes (Signed)
Pt has a tender area in her right occipital area that has not improved with a course of septra DS. Relates it is alittle bigger and more painful. Has pets but denies any tick exposure. States it started out as a pustule then got bigger.  Pt has a 3/4 x 1 1/2 raised tender reddened area in her right occipital region. ? Appears to be an abscess but PA relates on [place] didn't see an abscess cavity.   Have discussed with Burgess Amor, PA to put a needle in it and see if she gets purulent material, if so do I & D, if not refer to surgery.   Medical screening examination/treatment/procedure(s) were conducted as a shared visit with non-physician practitioner(s) and myself.  I personally evaluated the patient during the encounter  Devoria Albe, MD, Franz Dell, MD 07/10/12 2027

## 2012-07-10 NOTE — ED Notes (Signed)
Pt has a raised, red area behind right ear. Pt states she can feel it pulsating and it is giving her a headache.

## 2012-07-10 NOTE — ED Provider Notes (Signed)
History     CSN: [id number]  Arrival date & time 07/10/12  1908   First MD Initiated Contact with Patient 07/10/12 1945      Chief Complaint  Patient presents with  . Headache  . Abscess  . Lymphadenopathy    (Consider location/radiation/quality/duration/timing/severity/associated sxs/prior treatment) HPI Comments: Brandy Fisher is a 57 y.o. Female with a raised tender area on her right occipital area that was felt to be lymphadenopathy at her last visit about 2 weeks ago.  She has completed a course of bactrim and has been using warm compresses,  But the nodule is still very tender and slightly larger than at her first presentation.  She denies fevers and chills.  She has had a generalized headache and has a constant throbbing sensation at the nodule site.  There has been no drainage.     The history is provided by the patient.    Past Medical History  Diagnosis Date  . Diabetes mellitus   . Arthritis     Past Surgical History  Procedure Date  . Cesarean section   . Breast surgery     Left breast biopsy, negative  . Cholecystectomy   . Hip arthroplasty 03/16/2011    Procedure: ARTHROPLASTY BIPOLAR HIP;  Surgeon: Darreld Mclean;  Location: [facility];  Service: Orthopedics;  Laterality: Right;    Family History  Problem Relation Age of Onset  . Diabetes Mother     History  Substance Use Topics  . Smoking status: Current Every Day Smoker -- 0.5 packs/day for 25 years    Types: Cigarettes  . Smokeless tobacco: Not on file  . Alcohol Use: No    OB History    Grav Para Term Preterm Abortions TAB SAB Ect Mult Living                  Review of Systems  Constitutional: Negative for fever and chills.  HENT: Negative for facial swelling.   Respiratory: Negative for shortness of breath and wheezing.   Skin: Positive for color change and wound.  Neurological: Positive for headaches. Negative for numbness.    Allergies  Morphine and related  Home Medications    Current Outpatient Rx  Name  Route  Sig  Dispense  Refill  . IBUPROFEN 200 MG PO TABS   Oral   Take 600 mg by mouth every 6 (six) hours as needed. FOR PAIN         . INSULIN ASPART 100 UNIT/ML [place] SOLN   Subcutaneous   Inject 20 Units into the skin at bedtime.         Marland Kitchen METFORMIN HCL 500 MG PO TABS   Oral   Take 500 mg by mouth 2 (two) times daily with a meal.          . FISH OIL 1000 MG PO CAPS   Oral   Take 1,000 mg by mouth 2 (two) times daily.         . CEPHALEXIN 500 MG PO CAPS   Oral   Take 2 capsules (1,000 mg total) by mouth 2 (two) times daily.   40 capsule   0     BP 168/66  Pulse 73  Temp 97.9 F (36.6 C) (Oral)  Resp 16  Ht 5' 7.5" (1.715 m)  Wt 188 lb 6.4 oz (85.458 kg)  BMI 29.07 kg/m2  SpO2 99%  Physical Exam  Constitutional: She is oriented to person, place, and time. She appears well-developed and  well-nourished.  HENT:  Head: Normocephalic.  Cardiovascular: Normal rate.   Pulmonary/Chest: Effort normal.  Musculoskeletal: Normal range of motion.  Neurological: She is alert and oriented to person, place, and time. No sensory deficit.  Skin: Skin is warm. Laceration noted. There is erythema.       Raised,  Indurated lesion right occipital area approx  1.5 x 3 cm raised indurated lesion,  Erythematous with no surrounding erythema.      ED Course  Procedures (including critical care time)   INCISION AND DRAINAGE Performed by: Burgess Amor Consent: Verbal consent obtained. Risks and benefits: risks, benefits and alternatives were discussed Type: abscess  Body area: right occiput  Anesthesia: local infiltration  Incision was made with a scalpel after needle aspiration with 21 gauge needle revealed purulent dc..  Local anesthetic: lidocaine 2% with epinephrine  Anesthetic total: 2 ml  Complexity: complex Blunt dissection to break up loculations  Drainage: purulent  Drainage amount: moderate  Packing material: 1/4 in  iodoform gauze  Patient tolerance: Patient tolerated the procedure well with no immediate complications.      Labs Reviewed  WOUND CULTURE   No results found.   1. Abscess of neck       MDM  Wound culture sent.  Pt has just completed course of bactrim.  She was prescribed keflex,  Encouraged warm compresses after packing is removed tomorrow (patient will do this).  F/u with pcp or return here with any worsened sx.        Burgess Amor, [place] 07/10/12 2121

## 2012-07-10 NOTE — ED Notes (Signed)
Patient with no complaints at this time. Respirations even and unlabored. Skin warm/dry. Discharge instructions reviewed with patient at this time. Patient given opportunity to voice concerns/ask questions. Patient discharged at this time and left Emergency Department with steady gait.   

## 2012-07-11 NOTE — ED Provider Notes (Signed)
See prior note   Ward Givens, MD 07/11/12 0040

## 2012-07-13 LAB — CULTURE, ROUTINE-ABSCESS: Gram Stain: NONE SEEN

## 2012-07-14 NOTE — ED Notes (Signed)
Abscess. Patient given Keflex. No sensitivity listed. Chart sent to EDP office for review.

## 2012-07-16 NOTE — ED Notes (Signed)
Chart returned from EDP office. Per Dahlia Client Muthersbaugh PA-C, Rx okay.

## 2013-04-01 ENCOUNTER — Encounter (HOSPITAL_COMMUNITY): Payer: Self-pay | Admitting: *Deleted

## 2013-04-01 ENCOUNTER — Emergency Department (HOSPITAL_COMMUNITY)
Admission: EM | Admit: 2013-04-01 | Discharge: 2013-04-02 | Disposition: A | Discharge status: Home / Self Care | Payer: Medicaid Other | Attending: Emergency Medicine | Admitting: Emergency Medicine

## 2013-04-01 DIAGNOSIS — Z96649 Presence of unspecified artificial hip joint: Secondary | ICD-10-CM | POA: Insufficient documentation

## 2013-04-01 DIAGNOSIS — M549 Dorsalgia, unspecified: Secondary | ICD-10-CM

## 2013-04-01 DIAGNOSIS — G8929 Other chronic pain: Secondary | ICD-10-CM | POA: Insufficient documentation

## 2013-04-01 DIAGNOSIS — M129 Arthropathy, unspecified: Secondary | ICD-10-CM | POA: Insufficient documentation

## 2013-04-01 DIAGNOSIS — E119 Type 2 diabetes mellitus without complications: Secondary | ICD-10-CM | POA: Insufficient documentation

## 2013-04-01 DIAGNOSIS — Z79899 Other long term (current) drug therapy: Secondary | ICD-10-CM | POA: Insufficient documentation

## 2013-04-01 DIAGNOSIS — M545 Low back pain, unspecified: Secondary | ICD-10-CM | POA: Insufficient documentation

## 2013-04-01 DIAGNOSIS — M25559 Pain in unspecified hip: Secondary | ICD-10-CM | POA: Insufficient documentation

## 2013-04-01 DIAGNOSIS — Z791 Long term (current) use of non-steroidal anti-inflammatories (NSAID): Secondary | ICD-10-CM | POA: Insufficient documentation

## 2013-04-01 DIAGNOSIS — F17200 Nicotine dependence, unspecified, uncomplicated: Secondary | ICD-10-CM | POA: Insufficient documentation

## 2013-04-01 LAB — GLUCOSE, CAPILLARY: Glucose-Capillary: 193 mg/dL — ABNORMAL HIGH (ref 70–99)

## 2013-04-01 MED ORDER — KETOROLAC TROMETHAMINE 60 MG/2ML IM SOLN
60.0000 mg | Freq: Once | INTRAMUSCULAR | Status: AC
Start: 2013-04-01 — End: 2013-04-01
  Administered 2013-04-01: 60 mg via INTRAMUSCULAR
  Filled 2013-04-01: qty 2

## 2013-04-01 MED ORDER — NAPROXEN 500 MG PO TABS: 500.0000 mg | ORAL_TABLET | Freq: Two times a day (BID) | ORAL | Status: DC

## 2013-04-01 MED ORDER — METFORMIN HCL 500 MG PO TABS: 500.0000 mg | ORAL_TABLET | Freq: Two times a day (BID) | ORAL | Status: DC

## 2013-04-01 NOTE — ED Notes (Addendum)
Pt reporting pain in lower back and right hip. Reports chronic back pain, increased pain in past day.

## 2013-04-01 NOTE — ED Provider Notes (Signed)
Scribed for Brandy Roller, MD, the patient was seen in room APA16A/APA16A. This chart was scribed by Lewanda Rife, ED scribe. Patient's care was started at 2316  CSN: [id number]     Arrival date & time 04/01/13  2232 History   First MD Initiated Contact with Patient 04/01/13 2303     Chief Complaint  Patient presents with  . Back Pain  . Hip Pain   (Consider location/radiation/quality/duration/timing/severity/associated sxs/prior Treatment) The history is provided by the patient.   HPI Comments: Brandy Fisher is a 57 y.o. female who presents to the Emergency Department complaining of chronic constant moderate low back pain and right hip pain onset 4 years. Denies associated recent injuries, recent falls, fever  Reports back pain is exacerbated by bending over and alleviated by certain positions. Reports right hip pain is exacerbated by going up the steps. Reports running out of metformin and insulin shots. Reports PMHx DM, and partial right hip replacement. Denies risk factors for pathologic back pain.   Past Medical History  Diagnosis Date  . Diabetes mellitus   . Arthritis    Past Surgical History  Procedure Laterality Date  . Cesarean section    . Breast surgery      Left breast biopsy, negative  . Cholecystectomy    . Hip arthroplasty  03/16/2011    Procedure: ARTHROPLASTY BIPOLAR HIP;  Surgeon: Darreld Mclean;  Location: [facility];  Service: Orthopedics;  Laterality: Right;   Family History  Problem Relation Age of Onset  . Diabetes Mother    History  Substance Use Topics  . Smoking status: Current Every Day Smoker -- 0.50 packs/day for 25 years    Types: Cigarettes  . Smokeless tobacco: Not on file  . Alcohol Use: No   OB History   Grav Para Term Preterm Abortions TAB SAB Ect Mult Living                 Review of Systems  Constitutional: Negative for fever.  Musculoskeletal: Positive for back pain.  Skin: Negative for rash.  Neurological: Negative for  numbness.    Allergies  Morphine and related  Home Medications   Current Outpatient Rx  Name  Route  Sig  Dispense  Refill  . metFORMIN (GLUCOPHAGE) 500 MG tablet   Oral   Take 1 tablet (500 mg total) by mouth 2 (two) times daily with a meal.   60 tablet   1   . naproxen (NAPROSYN) 500 MG tablet   Oral   Take 1 tablet (500 mg total) by mouth 2 (two) times daily with a meal.   30 tablet   0    BP 166/66  Pulse 86  Temp(Src) 98.1 F (36.7 C) (Oral)  Resp 22  Ht 5\' 7"  (1.702 m)  Wt 191 lb 6 oz (86.807 kg)  BMI 29.97 kg/m2  SpO2 98% Physical Exam  Nursing note and vitals reviewed. Constitutional: She is oriented to person, place, and time. She appears well-developed and well-nourished. No distress.  HENT:  Head: Normocephalic and atraumatic.  Eyes: Conjunctivae and EOM are normal.  Neck: Neck supple. No tracheal deviation present.  Cardiovascular: Normal rate and regular rhythm.   No murmur heard. Pulmonary/Chest: Effort normal and breath sounds normal. No respiratory distress.  Musculoskeletal: Normal range of motion.       Right hip: Normal. She exhibits normal range of motion, normal strength, no tenderness, no bony tenderness, no swelling, no crepitus and no deformity.  Back:  No rotational pain of right hip  TTP of sacrum   Neurological: She is alert and oriented to person, place, and time. She has normal strength. No sensory deficit.  Normal sensation at the hip bilaterally. Straight leg bilaterally against resistance. Decreased and symmetric bilateral patellar reflexes     Skin: Skin is warm and dry.  Psychiatric: She has a normal mood and affect. Her behavior is normal.    ED Course  Procedures (including critical care time) Labs Review Labs Reviewed  GLUCOSE, CAPILLARY - Abnormal; Notable for the following:    Glucose-Capillary 193 (*)    All other components within normal limits   Imaging Review No results found.  MDM   1. Chronic pain     2. Back pain    Pt appears stable, no red flags, pain meds given, f/u with local MD's, refilled DM meds,   Meds given in ED:  Medications  ketorolac (TORADOL) injection 60 mg (not administered)    New Prescriptions   METFORMIN (GLUCOPHAGE) 500 MG TABLET    Take 1 tablet (500 mg total) by mouth 2 (two) times daily with a meal.   NAPROXEN (NAPROSYN) 500 MG TABLET    Take 1 tablet (500 mg total) by mouth 2 (two) times daily with a meal.      I personally performed the services described in this documentation, which was scribed in my presence. The recorded information has been reviewed and is accurate.      Brandy Roller, MD 04/01/13 [phone]

## 2013-09-14 ENCOUNTER — Emergency Department (HOSPITAL_COMMUNITY)
Admission: EM | Admit: 2013-09-14 | Discharge: 2013-09-14 | Disposition: A | Discharge status: Home / Self Care | Payer: Medicare Other | Attending: Emergency Medicine | Admitting: Emergency Medicine

## 2013-09-14 ENCOUNTER — Emergency Department (HOSPITAL_COMMUNITY): Payer: Medicare Other

## 2013-09-14 ENCOUNTER — Encounter (HOSPITAL_COMMUNITY): Payer: Self-pay | Admitting: Emergency Medicine

## 2013-09-14 DIAGNOSIS — IMO0002 Reserved for concepts with insufficient information to code with codable children: Secondary | ICD-10-CM | POA: Insufficient documentation

## 2013-09-14 DIAGNOSIS — Y939 Activity, unspecified: Secondary | ICD-10-CM | POA: Insufficient documentation

## 2013-09-14 DIAGNOSIS — S90129A Contusion of unspecified lesser toe(s) without damage to nail, initial encounter: Secondary | ICD-10-CM | POA: Insufficient documentation

## 2013-09-14 DIAGNOSIS — F17200 Nicotine dependence, unspecified, uncomplicated: Secondary | ICD-10-CM | POA: Insufficient documentation

## 2013-09-14 DIAGNOSIS — Y929 Unspecified place or not applicable: Secondary | ICD-10-CM | POA: Insufficient documentation

## 2013-09-14 DIAGNOSIS — M129 Arthropathy, unspecified: Secondary | ICD-10-CM | POA: Insufficient documentation

## 2013-09-14 DIAGNOSIS — Z791 Long term (current) use of non-steroidal anti-inflammatories (NSAID): Secondary | ICD-10-CM | POA: Insufficient documentation

## 2013-09-14 DIAGNOSIS — Z79899 Other long term (current) drug therapy: Secondary | ICD-10-CM | POA: Insufficient documentation

## 2013-09-14 DIAGNOSIS — E119 Type 2 diabetes mellitus without complications: Secondary | ICD-10-CM | POA: Insufficient documentation

## 2013-09-14 LAB — CBG MONITORING, ED: Glucose-Capillary: 359 mg/dL — ABNORMAL HIGH (ref 70–99)

## 2013-09-14 MED ORDER — SULFAMETHOXAZOLE-TMP DS 800-160 MG PO TABS
1.0000 | ORAL_TABLET | Freq: Once | ORAL | Status: AC
  Administered 2013-09-14: 1 via ORAL
  Filled 2013-09-14: qty 1

## 2013-09-14 MED ORDER — HYDROCODONE-ACETAMINOPHEN 5-325 MG PO TABS: 1.0000 | ORAL_TABLET | Freq: Four times a day (QID) | ORAL | Status: DC | PRN

## 2013-09-14 MED ORDER — HYDROCODONE-ACETAMINOPHEN 5-325 MG PO TABS
1.0000 | ORAL_TABLET | Freq: Once | ORAL | Status: AC
Start: 2013-09-14 — End: 2013-09-14
  Administered 2013-09-14: 1 via ORAL
  Filled 2013-09-14: qty 1

## 2013-09-14 MED ORDER — SULFAMETHOXAZOLE-TRIMETHOPRIM 800-160 MG PO TABS: 1.0000 | ORAL_TABLET | Freq: Two times a day (BID) | ORAL | Status: DC

## 2013-09-14 NOTE — ED Notes (Signed)
Pt alert & oriented x4, stable gait. Patient  given discharge instructions, paperwork & prescription(s). Patient called ride before leaving the room. Patient  verbalized understanding. Pt left department w/ no further questions.

## 2013-09-14 NOTE — ED Notes (Signed)
Pt states she is not taking insulin because she cant afford. Pt states she is still taking her diabetic pills but has not taken tonight.

## 2013-09-14 NOTE — Discharge Instructions (Signed)
Follow up with a family md or your orthopedic md next week.

## 2013-09-14 NOTE — ED Notes (Signed)
Patient complaining of pain to left pinky toe x 1.5 weeks. Denies injury to foot or toe. Reports history of diabetes. Discoloration noted to left pinky toe.

## 2013-09-14 NOTE — ED Notes (Signed)
Pt presents w/ left 5th toe, discolored. Pt states has not been keep a check on her sugar due to needing batteries & stripes for her unit. Pt has not seen PCP because she has to pay before seeing him.

## 2013-09-14 NOTE — ED Provider Notes (Signed)
CSN: [id number]     Arrival date & time 09/14/13  2030 History  This chart was scribed for Brandy Diego, MD by Brandy Fisher, ED Scribe. This patient was seen in room APA08/APA08 and the patient's care was started at 10:09 PM.   Chief Complaint  Patient presents with  . Toe Pain   Patient is a 58 y.o. female presenting with toe pain. The history is provided by the patient. No language interpreter was used.  Toe Pain This is a new problem. The current episode started more than 1 week ago. The problem occurs constantly. The problem has not changed since onset.Pertinent negatives include no chest pain, no abdominal pain and no headaches. The symptoms are aggravated by walking. Nothing relieves the symptoms. She has tried nothing for the symptoms.    HPI Comments: Brandy Fisher is a 58 y.o. female with a h/o DM who presents to the Emergency Department complaining of left pinky toe pain that started almost 2 weeks ago but became worse when her grandson stopped on it 2 to 3 days ago. The pain has been constant since then and she reports difficulty ambulation secondary to pain. She denies any other sxs.  No PCP Dr. Luna Fisher is Orthopedist   Past Medical History  Diagnosis Date  . Diabetes mellitus   . Arthritis    Past Surgical History  Procedure Laterality Date  . Cesarean section    . Breast surgery      Left breast biopsy, negative  . Cholecystectomy    . Hip arthroplasty  03/16/2011    Procedure: ARTHROPLASTY BIPOLAR HIP;  Surgeon: Brandy Fisher;  Location: [facility];  Service: Orthopedics;  Laterality: Right;   Family History  Problem Relation Age of Onset  . Diabetes Mother    History  Substance Use Topics  . Smoking status: Current Every Day Smoker -- 0.50 packs/day for 25 years    Types: Cigarettes  . Smokeless tobacco: Not on file  . Alcohol Use: No   No OB history provided.   Review of Systems  Constitutional: Negative for appetite change and fatigue.  HENT:  Negative for congestion, ear discharge and sinus pressure.   Eyes: Negative for discharge.  Respiratory: Negative for cough.   Cardiovascular: Negative for chest pain.  Gastrointestinal: Negative for abdominal pain and diarrhea.  Genitourinary: Negative for frequency and hematuria.  Musculoskeletal: Positive for arthralgias (left pinky toe). Negative for back pain.  Skin: Negative for rash.  Neurological: Negative for seizures and headaches.  Psychiatric/Behavioral: Negative for hallucinations.      Allergies  Morphine and related  Home Medications   Current Outpatient Rx  Name  Route  Sig  Dispense  Refill  . metFORMIN (GLUCOPHAGE) 500 MG tablet   Oral   Take 1 tablet (500 mg total) by mouth 2 (two) times daily with a meal.   60 tablet   1   . naproxen (NAPROSYN) 500 MG tablet   Oral   Take 1 tablet (500 mg total) by mouth 2 (two) times daily with a meal.   30 tablet   0    Triage vitals: BP 152/106  Pulse 88  Temp(Src) 98.7 F (37.1 C) (Oral)  Resp 18  Ht 5\' 7"  (1.702 m)  Wt 183 lb (83.008 kg)  BMI 28.66 kg/m2  SpO2 99%  Physical Exam  Nursing note and vitals reviewed. Constitutional: She is oriented to person, place, and time. She appears well-developed and well-nourished.  HENT:  Head: Normocephalic and  atraumatic.  Eyes: Conjunctivae are normal.  Neck: No tracheal deviation present.  Cardiovascular: Normal rate.   Pulmonary/Chest: Effort normal.  Musculoskeletal:  Swelling and discoloration to the left pinky toe with tenderness. No erythema or warmth  Neurological: She is alert and oriented to person, place, and time.  Skin: Skin is warm and dry.  Psychiatric: She has a normal mood and affect. Her behavior is normal.    ED Course  Procedures (including critical care time)  DIAGNOSTIC STUDIES: Oxygen Saturation is 99% on RA, normal by my interpretation.    COORDINATION OF CARE: 10:12 PM-Informed pt of negative radiology. Discussed discharge plan  which includes pain medications and antibiotics with pt and pt agreed to plan. Also advised pt to follow up with Ortho as needed and pt agreed. Addressed symptoms to return for with pt.   Labs Review Labs Reviewed  CBG MONITORING, ED - Abnormal; Notable for the following:    Glucose-Capillary 359 (*)    All other components within normal limits   Imaging Review Dg Toe 5th Left  09/14/2013   CLINICAL DATA:  Left hip pain following injury  EXAM: DG TOE 5TH LEFT  COMPARISON:  None.  FINDINGS: There is no evidence of fracture or dislocation. There is no evidence of arthropathy or other focal bone abnormality. Soft tissues are unremarkable.  IMPRESSION: No acute abnormality noted.   Electronically Signed   By: Brandy Fisher M.D.   On: 09/14/2013 21:52     EKG Interpretation None      MDM   Final diagnoses:  None    Toe contusion The chart was scribed for me under my direct supervision.  I personally performed the history, physical, and medical decision making and all procedures in the evaluation of this patient.Brandy Diego, MD 09/14/13 [phone]

## 2013-11-08 ENCOUNTER — Inpatient Hospital Stay (HOSPITAL_COMMUNITY)
Admission: EM | Admit: 2013-11-08 | Discharge: 2013-11-11 | DRG: 638 | Disposition: A | Discharge status: Home Health | Payer: Medicare Other | Attending: Internal Medicine | Admitting: Internal Medicine

## 2013-11-08 ENCOUNTER — Observation Stay (HOSPITAL_COMMUNITY): Payer: Medicare Other

## 2013-11-08 ENCOUNTER — Encounter (HOSPITAL_COMMUNITY): Payer: Self-pay | Admitting: Emergency Medicine

## 2013-11-08 ENCOUNTER — Emergency Department (HOSPITAL_COMMUNITY): Payer: Medicare Other

## 2013-11-08 DIAGNOSIS — I96 Gangrene, not elsewhere classified: Secondary | ICD-10-CM | POA: Diagnosis present

## 2013-11-08 DIAGNOSIS — M129 Arthropathy, unspecified: Secondary | ICD-10-CM | POA: Diagnosis present

## 2013-11-08 DIAGNOSIS — L03032 Cellulitis of left toe: Secondary | ICD-10-CM | POA: Diagnosis present

## 2013-11-08 DIAGNOSIS — R739 Hyperglycemia, unspecified: Secondary | ICD-10-CM | POA: Diagnosis present

## 2013-11-08 DIAGNOSIS — R7309 Other abnormal glucose: Secondary | ICD-10-CM

## 2013-11-08 DIAGNOSIS — M86172 Other acute osteomyelitis, left ankle and foot: Secondary | ICD-10-CM

## 2013-11-08 DIAGNOSIS — Z91199 Patient's noncompliance with other medical treatment and regimen due to unspecified reason: Secondary | ICD-10-CM

## 2013-11-08 DIAGNOSIS — Z794 Long term (current) use of insulin: Secondary | ICD-10-CM

## 2013-11-08 DIAGNOSIS — L02619 Cutaneous abscess of unspecified foot: Secondary | ICD-10-CM | POA: Diagnosis present

## 2013-11-08 DIAGNOSIS — M9080 Osteopathy in diseases classified elsewhere, unspecified site: Secondary | ICD-10-CM | POA: Diagnosis present

## 2013-11-08 DIAGNOSIS — L03119 Cellulitis of unspecified part of limb: Secondary | ICD-10-CM

## 2013-11-08 DIAGNOSIS — IMO0002 Reserved for concepts with insufficient information to code with codable children: Principal | ICD-10-CM | POA: Diagnosis present

## 2013-11-08 DIAGNOSIS — L03039 Cellulitis of unspecified toe: Secondary | ICD-10-CM | POA: Diagnosis present

## 2013-11-08 DIAGNOSIS — E1165 Type 2 diabetes mellitus with hyperglycemia: Principal | ICD-10-CM | POA: Diagnosis present

## 2013-11-08 DIAGNOSIS — E119 Type 2 diabetes mellitus without complications: Secondary | ICD-10-CM

## 2013-11-08 DIAGNOSIS — Z833 Family history of diabetes mellitus: Secondary | ICD-10-CM

## 2013-11-08 DIAGNOSIS — E1169 Type 2 diabetes mellitus with other specified complication: Principal | ICD-10-CM

## 2013-11-08 DIAGNOSIS — E876 Hypokalemia: Secondary | ICD-10-CM | POA: Diagnosis present

## 2013-11-08 DIAGNOSIS — Z720 Tobacco use: Secondary | ICD-10-CM | POA: Diagnosis present

## 2013-11-08 DIAGNOSIS — Z96649 Presence of unspecified artificial hip joint: Secondary | ICD-10-CM

## 2013-11-08 DIAGNOSIS — IMO0001 Reserved for inherently not codable concepts without codable children: Secondary | ICD-10-CM | POA: Diagnosis present

## 2013-11-08 DIAGNOSIS — F17200 Nicotine dependence, unspecified, uncomplicated: Secondary | ICD-10-CM | POA: Diagnosis present

## 2013-11-08 DIAGNOSIS — Z9119 Patient's noncompliance with other medical treatment and regimen: Secondary | ICD-10-CM

## 2013-11-08 DIAGNOSIS — M86179 Other acute osteomyelitis, unspecified ankle and foot: Secondary | ICD-10-CM | POA: Diagnosis present

## 2013-11-08 HISTORY — DX: Other acute osteomyelitis, left ankle and foot: M86.172

## 2013-11-08 LAB — CBC WITH DIFFERENTIAL/PLATELET
Basophils Absolute: 0 10*3/uL (ref 0.0–0.1)
Basophils Relative: 0 % (ref 0–1)
Eosinophils Absolute: 0.1 10*3/uL (ref 0.0–0.7)
Eosinophils Relative: 1 % (ref 0–5)
HCT: 37.7 % (ref 36.0–46.0)
Hemoglobin: 13.1 g/dL (ref 12.0–15.0)
Lymphocytes Relative: 31 % (ref 12–46)
Lymphs Abs: 2.4 10*3/uL (ref 0.7–4.0)
MCH: 29.7 pg (ref 26.0–34.0)
MCHC: 34.7 g/dL (ref 30.0–36.0)
MCV: 85.5 fL (ref 78.0–100.0)
Monocytes Absolute: 0.5 10*3/uL (ref 0.1–1.0)
Monocytes Relative: 6 % (ref 3–12)
Neutro Abs: 4.8 10*3/uL (ref 1.7–7.7)
Neutrophils Relative %: 62 % (ref 43–77)
Platelets: 172 10*3/uL (ref 150–400)
RBC: 4.41 MIL/uL (ref 3.87–5.11)
RDW: 13 % (ref 11.5–15.5)
WBC: 7.7 10*3/uL (ref 4.0–10.5)

## 2013-11-08 LAB — BASIC METABOLIC PANEL
BUN: 10 mg/dL (ref 6–23)
CO2: 28 mEq/L (ref 19–32)
Calcium: 9.3 mg/dL (ref 8.4–10.5)
Chloride: 99 mEq/L (ref 96–112)
Creatinine, Ser: 0.65 mg/dL (ref 0.50–1.10)
GFR calc Af Amer: >90 mL/min (ref >90)
GFR calc non Af Amer: >90 mL/min (ref >90)
Glucose, Bld: 431 mg/dL — ABNORMAL HIGH (ref 70–99)
Potassium: 4.3 mEq/L (ref 3.7–5.3)
Sodium: 139 mEq/L (ref 137–147)

## 2013-11-08 LAB — CBG MONITORING, ED
Glucose-Capillary: 443 mg/dL — ABNORMAL HIGH (ref 70–99)
Glucose-Capillary: 376 mg/dL — ABNORMAL HIGH (ref 70–99)

## 2013-11-08 MED ORDER — PIPERACILLIN-TAZOBACTAM 3.375 G IVPB
3.3750 g | Freq: Three times a day (TID) | INTRAVENOUS | Status: DC
  Administered 2013-11-09 – 2013-11-11 (×8): 3.375 g via INTRAVENOUS
  Filled 2013-11-08 (×14): qty 50

## 2013-11-08 MED ORDER — INSULIN ASPART 100 UNIT/ML ~~LOC~~ SOLN
0.0000 [IU] | Freq: Three times a day (TID) | SUBCUTANEOUS | Status: DC
  Administered 2013-11-09: 3 [IU] via SUBCUTANEOUS
  Administered 2013-11-09 – 2013-11-10 (×3): 5 [IU] via SUBCUTANEOUS
  Administered 2013-11-10 – 2013-11-11 (×4): 3 [IU] via SUBCUTANEOUS

## 2013-11-08 MED ORDER — ACETAMINOPHEN 650 MG RE SUPP: 650.0000 mg | Freq: Four times a day (QID) | RECTAL | Status: DC | PRN

## 2013-11-08 MED ORDER — PIPERACILLIN-TAZOBACTAM 3.375 G IVPB
3.3750 g | Freq: Once | INTRAVENOUS | Status: AC
  Administered 2013-11-08: 3.375 g via INTRAVENOUS
  Filled 2013-11-08: qty 50

## 2013-11-08 MED ORDER — VANCOMYCIN HCL IN DEXTROSE 1-5 GM/200ML-% IV SOLN
1000.0000 mg | Freq: Two times a day (BID) | INTRAVENOUS | Status: DC
  Administered 2013-11-09 – 2013-11-10 (×3): 1000 mg via INTRAVENOUS
  Filled 2013-11-08 (×9): qty 200

## 2013-11-08 MED ORDER — ACETAMINOPHEN 325 MG PO TABS: 650.0000 mg | ORAL_TABLET | Freq: Four times a day (QID) | ORAL | Status: DC | PRN

## 2013-11-08 MED ORDER — INSULIN GLARGINE 100 UNIT/ML ~~LOC~~ SOLN
30.0000 [IU] | Freq: Every day | SUBCUTANEOUS | Status: DC
  Administered 2013-11-08 – 2013-11-10 (×3): 30 [IU] via SUBCUTANEOUS
  Filled 2013-11-08 (×5): qty 0.3

## 2013-11-08 MED ORDER — VANCOMYCIN HCL IN DEXTROSE 1-5 GM/200ML-% IV SOLN
INTRAVENOUS | Status: AC
  Filled 2013-11-08: qty 200

## 2013-11-08 MED ORDER — VANCOMYCIN HCL IN DEXTROSE 1-5 GM/200ML-% IV SOLN
1000.0000 mg | Freq: Once | INTRAVENOUS | Status: AC
  Administered 2013-11-08: 1000 mg via INTRAVENOUS
  Filled 2013-11-08: qty 200

## 2013-11-08 MED ORDER — INSULIN ASPART 100 UNIT/ML ~~LOC~~ SOLN
10.0000 [IU] | Freq: Once | SUBCUTANEOUS | Status: AC
  Administered 2013-11-08: 10 [IU] via INTRAVENOUS
  Filled 2013-11-08: qty 1

## 2013-11-08 MED ORDER — OXYCODONE HCL 5 MG PO TABS
5.0000 mg | ORAL_TABLET | Freq: Four times a day (QID) | ORAL | Status: DC | PRN
  Administered 2013-11-08 – 2013-11-11 (×8): 5 mg via ORAL
  Filled 2013-11-08 (×8): qty 1

## 2013-11-08 MED ORDER — SODIUM CHLORIDE 0.9 % IV BOLUS (SEPSIS)
1000.0000 mL | Freq: Once | INTRAVENOUS | Status: AC
  Administered 2013-11-08: 1000 mL via INTRAVENOUS

## 2013-11-08 MED ORDER — PIPERACILLIN-TAZOBACTAM 3.375 G IVPB
INTRAVENOUS | Status: AC
  Filled 2013-11-08: qty 50

## 2013-11-08 MED ORDER — ENOXAPARIN SODIUM 40 MG/0.4ML ~~LOC~~ SOLN
40.0000 mg | SUBCUTANEOUS | Status: DC
  Administered 2013-11-08 – 2013-11-10 (×3): 40 mg via SUBCUTANEOUS
  Filled 2013-11-08 (×3): qty 0.4

## 2013-11-08 MED ORDER — INSULIN ASPART 100 UNIT/ML ~~LOC~~ SOLN
0.0000 [IU] | Freq: Every day | SUBCUTANEOUS | Status: DC
  Administered 2013-11-08 – 2013-11-09 (×2): 2 [IU] via SUBCUTANEOUS

## 2013-11-08 MED ORDER — SODIUM CHLORIDE 0.9 % IV SOLN
INTRAVENOUS | Status: DC
  Administered 2013-11-08: 19:00:00 via INTRAVENOUS

## 2013-11-08 NOTE — Progress Notes (Addendum)
ANTIBIOTIC CONSULT NOTE - INITIAL  Pharmacy Consult for Vancomycin & Zosyn Indication: cellulitis  Allergies  Allergen Reactions  . Morphine And Related Nausea And Vomiting    Patient Measurements: Height: 5' 7.5" (171.5 cm) Weight: 172 lb 9.9 oz (78.3 kg) IBW/kg (Calculated) : 62.75  Vital Signs: Temp: 98 F (36.7 C) (05/06 1853) Temp src: Oral (05/06 1853) BP: 153/59 mmHg (05/06 1853) Pulse Rate: 78 (05/06 1853) Intake/Output from previous day:   Intake/Output from this shift:    Labs:  Recent Labs  11/08/13 1542  WBC 7.7  HGB 13.1  PLT 172  CREATININE 0.65   Estimated Creatinine Clearance: 81.5 ml/min (by C-G formula based on Cr of 0.65). No results found for this basename: VANCOTROUGH, VANCOPEAK, VANCORANDOM, GENTTROUGH, GENTPEAK, GENTRANDOM, TOBRATROUGH, TOBRAPEAK, TOBRARND, AMIKACINPEAK, AMIKACINTROU, AMIKACIN,  in the last 72 hours   Microbiology: No results found for this or any previous visit (from the past 720 hour(s)).  Medical History: Past Medical History  Diagnosis Date  . Diabetes mellitus   . Arthritis     Medications:  Scheduled:  . enoxaparin (LOVENOX) injection  40 mg Subcutaneous Q24H  . [START ON 11/09/2013] insulin aspart  0-15 Units Subcutaneous TID WC  . insulin aspart  0-5 Units Subcutaneous QHS  . insulin glargine  30 Units Subcutaneous QHS  . piperacillin-tazobactam (ZOSYN)  IV  3.375 g Intravenous Once  . [START ON 11/09/2013] piperacillin-tazobactam (ZOSYN)  IV  3.375 g Intravenous Q8H   Assessment: 58 yo F with pain, redness, & draining blister of left 2nd toe. MRI + osteo.  Patient is afebrile with normal WBC. Renal function is at patient's baseline.   Vancomycin 5/6>> Zosyn 5/6>>  Goal of Therapy:  Vancomycin 15-20 mcg/ml  Plan:  Continue Zosyn 3.375gm IV Q8h to be infused over 4hrs Vancomycin 1gm IV q12h Check Vancomycin trough at steady state Monitor renal function and cx data   Brandy Fisher  Brandy Fisher 11/08/2013,7:20 PM

## 2013-11-08 NOTE — ED Notes (Signed)
Error in charting with hall pass.

## 2013-11-08 NOTE — ED Provider Notes (Signed)
CSN: [id number]     Arrival date & time 11/08/13  1418 History   First MD Initiated Contact with Patient 11/08/13 1424     Chief Complaint  Patient presents with  . Toe Pain     (Consider location/radiation/quality/duration/timing/severity/associated sxs/prior Treatment) HPI Comments: Pt c/o left pinky toe pain for 1 month after her grandson stomped on the area. Pt states that she has some blisters to the area and they turned white and now they are draining and she has redness to the area. Pt states that she has been seen with something similar before but antibiotics and pain medication made it better. Pt is unsure of what her blood sugar has been.  The history is provided by the patient. No language interpreter was used.    Past Medical History  Diagnosis Date  . Diabetes mellitus   . Arthritis    Past Surgical History  Procedure Laterality Date  . Cesarean section    . Breast surgery      Left breast biopsy, negative  . Cholecystectomy    . Hip arthroplasty  03/16/2011    Procedure: ARTHROPLASTY BIPOLAR HIP;  Surgeon: Sanjuana Kava;  Location: [facility];  Service: Orthopedics;  Laterality: Right;   Family History  Problem Relation Age of Onset  . Diabetes Mother    History  Substance Use Topics  . Smoking status: Current Every Day Smoker -- 0.50 packs/day for 25 years    Types: Cigarettes  . Smokeless tobacco: Not on file  . Alcohol Use: No   OB History   Grav Para Term Preterm Abortions TAB SAB Ect Mult Living                 Review of Systems  Constitutional: Negative.   Respiratory: Negative.   Cardiovascular: Negative.       Allergies  Morphine and related  Home Medications   Prior to Admission medications   Medication Sig Start Date End Date Taking? Authorizing Provider  HYDROcodone-acetaminophen (NORCO/VICODIN) 5-325 MG per tablet Take 1 tablet by mouth every 6 (six) hours as needed for moderate pain. 09/14/13   [name], MD  ibuprofen  (ADVIL,MOTRIN) 200 MG tablet Take 800 mg by mouth every 6 (six) hours as needed.    Historical Provider, MD  metFORMIN (GLUCOPHAGE) 500 MG tablet Take 1 tablet (500 mg total) by mouth 2 (two) times daily with a meal. 04/01/13   Johnna Acosta, MD  sulfamethoxazole-trimethoprim (SEPTRA DS) 800-160 MG per tablet Take 1 tablet by mouth 2 (two) times daily. 09/14/13   [name], MD   BP 177/69  Pulse 102  Temp(Src) 97.9 F (36.6 C) (Oral)  Resp 18  Ht 5' 7.5" (1.715 m)  Wt 173 lb (78.472 kg)  BMI 26.68 kg/m2  SpO2 97% Physical Exam  Nursing note and vitals reviewed. Constitutional: She is oriented to person, place, and time.  Cardiovascular: Normal rate and regular rhythm.   Pulmonary/Chest: Effort normal and breath sounds normal.  Neurological: She is alert and oriented to person, place, and time. Coordination normal.  Skin:  Pt has brown discoloration to the tip of the left pinky toe with opening draining wound the top of the toe. Redness tracking up the foot:yellow drainage noted from the area    ED Course  Procedures (including critical care time) Labs Review Labs Reviewed - No data to display  Imaging Review Dg Foot Complete Left  11/08/2013   CLINICAL DATA:  Left foot pain  EXAM:  LEFT FOOT - COMPLETE 3+ VIEW  COMPARISON:  09/14/2013  FINDINGS: There is no evidence of fracture or dislocation. There is no evidence of arthropathy or other focal bone abnormality. Soft tissues are unremarkable.  IMPRESSION: No acute abnormality noted.   Electronically Signed   By: Inez Catalina M.D.   On: 11/08/2013 14:58     EKG Interpretation None      MDM   Final diagnoses:  Necrotic toes  Hyperglycemia    Spoke with Dr. Aline Brochure who said that the hospitalist should admit and podiatry should be consulted:pt to be admitted by [facility] for the toe and to get her blood sugar under control    Glendell Docker, NP 11/08/13 1641

## 2013-11-08 NOTE — H&P (Signed)
[facility] History and Physical  JANAL HAAK YCX:[id number] DOB: [birth year] DOA: 11/08/2013  Referring physician: Dr Eulis Foster PCP: No PCP Per Patient   Chief Complaint:  Pain and discharged over her left small toe  HPI:  58 year old female with uncontrolled diabetes mellitus (recent hemoglobin A1c, on insulin) presented to the ED with pain and redness over left small toe progressive for past one month. She reports that her grandson stomped on the area following which she developed a small blister which initially turned white and subsequently became red and painful and for the possible week or so it has been draining yellowish discharge. She reports increased pain to the area with difficulty ambulating. She reports checking her blood sugar infrequently ( few times a month) and takes Lantus 20 units at bedtime and pre-meal insulin depending upon her blood glucose. She reports during that times she has checked her blood glucose, they run from  150-250. She reports polyuria and polydipsia and wakes up almost 5-6 times during the night to urinate. She reports numbness and burning sensation in her feet. She denies any bloody vision. She denies any fever or chills. Patient denies headache, dizziness,  nausea , vomiting, chest pain, palpitations, SOB, abdominal pain, bowel symptoms. Denies change in weight or appetite.  Course in the ED The patient's vitals were stable. Blood work done was unremarkable except for blood glucose of 430s. X-ray of the foot was unremarkable for osteomyelitis. Patient given a dose of IV vancomycin and Zosyn and hospitalists called for admission to medical floor.  Review of Systems:  Constitutional: Denies fever, chills, diaphoresis, appetite change and fatigue.  HEENT: Denies photophobia, eye pain, redness, hearing loss, ear pain, congestion, sore throat, rhinorrhea, sneezing, mouth sores, trouble swallowing, neck pain,  Respiratory: Denies SOB, DOE, cough, chest  tightness,  and wheezing.   Cardiovascular: Denies chest pain, palpitations and leg swelling.  Gastrointestinal: Denies nausea, vomiting, abdominal pain, diarrhea, constipation, blood in stool and abdominal distention.  Genitourinary: Denies dysuria, urgency, hematuria, flank pain and difficulty urinating.  Endocrine: Denies: hot or cold intolerance,, polyuria, polydipsia. Musculoskeletal: Pain erythema and discharge over left small toe Denies myalgias, back pain, joint swelling, arthralgias  Neurological: Denies dizziness, seizures, syncope, weakness, light-headedness, and headaches.  tingling and numbness of extremities   Past Medical History  Diagnosis Date  . Diabetes mellitus   . Arthritis    Past Surgical History  Procedure Laterality Date  . Cesarean section    . Breast surgery      Left breast biopsy, negative  . Cholecystectomy    . Hip arthroplasty  03/16/2011    Procedure: ARTHROPLASTY BIPOLAR HIP;  Surgeon: Sanjuana Kava;  Location: [facility];  Service: Orthopedics;  Laterality: Right;   Social History:  reports that she has been smoking Cigarettes.  She has a 12.5 pack-year smoking history. She does not have any smokeless tobacco history on file. She reports that she does not drink alcohol or use illicit drugs.  Allergies  Allergen Reactions  . Morphine And Related Nausea And Vomiting    Family History  Problem Relation Age of Onset  . Diabetes Mother     Prior to Admission medications   Medication Sig Start Date End Date Taking? Authorizing Provider  ibuprofen (ADVIL,MOTRIN) 200 MG tablet Take 800 mg by mouth every 6 (six) hours as needed. pain   Yes Historical Provider, MD  insulin aspart (NOVOLOG) 100 UNIT/ML injection Inject 15 Units into the skin 3 (three) times daily before meals.  Yes Historical Provider, MD  insulin glargine (LANTUS) 100 UNIT/ML injection Inject 10 Units into the skin daily.   Yes Historical Provider, MD  metFORMIN (GLUCOPHAGE) 500 MG tablet  Take 1 tablet (500 mg total) by mouth 2 (two) times daily with a meal. 04/01/13  Yes Johnna Acosta, MD     Physical Exam:  Filed Vitals:   11/08/13 1422 11/08/13 1554 11/08/13 1627  BP: 177/69 118/61 126/58  Pulse: 102 88 79  Temp: 97.9 F (36.6 C)    TempSrc: Oral    Resp: 18  18  Height: 5' 7.5" (1.715 m)    Weight: 78.472 kg (173 lb)    SpO2: 97% 94% 96%    Constitutional: Vital signs reviewed.  Elderly female in no acute distress. HEENT: no pallor, no icterus, moist oral mucosa,  Cardiovascular: RRR, S1 normal, S2 normal, no MRG Chest: CTAB, no wheezes, rales, or rhonchi Abdominal: Soft. Non-tender, non-distended, bowel sounds are normal, no masses, organomegaly, or guarding present.  GU: no CVA tenderness Ext: blackish coloration of the tip of the left small toe with open draining wound from the tip with erythema up to the dorsum of the foot. Tender to palpation.  distal pulses palpable  Neurological: A&O x3, non focal  Labs on Admission:  Basic Metabolic Panel:  Recent Labs Lab 11/08/13 1542  NA 139  K 4.3  CL 99  CO2 28  GLUCOSE 431*  BUN 10  CREATININE 0.65  CALCIUM 9.3   Liver Function Tests: No results found for this basename: AST, ALT, ALKPHOS, BILITOT, PROT, ALBUMIN,  in the last 168 hours No results found for this basename: LIPASE, AMYLASE,  in the last 168 hours No results found for this basename: AMMONIA,  in the last 168 hours CBC:  Recent Labs Lab 11/08/13 1542  WBC 7.7  NEUTROABS 4.8  HGB 13.1  HCT 37.7  MCV 85.5  PLT 172   Cardiac Enzymes: No results found for this basename: CKTOTAL, CKMB, CKMBINDEX, TROPONINI,  in the last 168 hours BNP: No components found with this basename: POCBNP,  CBG:  Recent Labs Lab 11/08/13 1514  GLUCAP 443*    Radiological Exams on Admission: Dg Foot Complete Left  11/08/2013   CLINICAL DATA:  Left foot pain  EXAM: LEFT FOOT - COMPLETE 3+ VIEW  COMPARISON:  09/14/2013  FINDINGS: There is no evidence  of fracture or dislocation. There is no evidence of arthropathy or other focal bone abnormality. Soft tissues are unremarkable.  IMPRESSION: No acute abnormality noted.   Electronically Signed   By: Inez Catalina M.D.   On: 11/08/2013 14:58      Assessment/Plan Principal Problem:   Cellulitis of second toe of left foot Admit to MedSurg. X-ray of the foot negative for osteomyelitis however given significant discoloration and discharge on a diabetic patient, will obtain an MRI of the foot to rule out underlying osteomyelitis. -Will place consult for wound care and Gen. surgery to evaluate the wound. -Patient given IV vancomycin and Zosyn in the ED which I would continue . -PT eval  Active Problems:   Uncontrolled diabetes mellitus type 2 without complications Patient seems to be noncompliant with her medications and has chronically uncontrolled diabetes. Check A1c. I would place her on Lantus 30 units at bedtime and sliding scale insulin. Emphasized on medication compliance, diet adherence and frequent blood glucose monitoring.    Necrotic toes   Hyperglycemia   Tobacco abuse Also strongly on smoking physician    Diet:  diabetic  DVT prophylaxis: sq lovenox   Code Status: full code Family Communication: None at bedside Disposition Plan: home once improved  Halden Phegley [facility] Pager [phone]  Total time spent on admission :70 minutes  If 7PM-7AM, please contact night-coverage [email or web address] Password [email or web address] 11/08/2013, 5:12 PM

## 2013-11-08 NOTE — ED Notes (Signed)
Pt states she checks her blood sugar when she can. States she has been out of strips. Also, states she took her last Metformin this morning. States she has not been to her doctor to get her prescription

## 2013-11-08 NOTE — ED Notes (Signed)
Pt c/o left fifth toe pain x1 month. Pt states her grandson "stomped" on her toe.

## 2013-11-08 NOTE — ED Notes (Signed)
Pt's left little toe necrotic looking. No odor noted

## 2013-11-08 NOTE — ED Notes (Signed)
Not an accurate pulse ox.  Checked and with a good waveform  sats 95%

## 2013-11-09 ENCOUNTER — Encounter (HOSPITAL_COMMUNITY): Payer: Self-pay | Admitting: Internal Medicine

## 2013-11-09 DIAGNOSIS — M86172 Other acute osteomyelitis, left ankle and foot: Secondary | ICD-10-CM | POA: Diagnosis present

## 2013-11-09 DIAGNOSIS — M86179 Other acute osteomyelitis, unspecified ankle and foot: Secondary | ICD-10-CM

## 2013-11-09 DIAGNOSIS — E119 Type 2 diabetes mellitus without complications: Secondary | ICD-10-CM

## 2013-11-09 HISTORY — DX: Other acute osteomyelitis, left ankle and foot: M86.172

## 2013-11-09 LAB — HEMOGLOBIN A1C
Hgb A1c MFr Bld: 10.6 % — ABNORMAL HIGH (ref <5.7)
Mean Plasma Glucose: 258 mg/dL — ABNORMAL HIGH (ref <117)

## 2013-11-09 LAB — BASIC METABOLIC PANEL
BUN: 11 mg/dL (ref 6–23)
CO2: 26 mEq/L (ref 19–32)
Calcium: 8.5 mg/dL (ref 8.4–10.5)
Chloride: 102 mEq/L (ref 96–112)
Creatinine, Ser: 0.6 mg/dL (ref 0.50–1.10)
GFR calc Af Amer: >90 mL/min (ref >90)
GFR calc non Af Amer: >90 mL/min (ref >90)
Glucose, Bld: 276 mg/dL — ABNORMAL HIGH (ref 70–99)
Potassium: 3.5 mEq/L — ABNORMAL LOW (ref 3.7–5.3)
Sodium: 138 mEq/L (ref 137–147)

## 2013-11-09 LAB — GLUCOSE, CAPILLARY
Glucose-Capillary: 223 mg/dL — ABNORMAL HIGH (ref 70–99)
Glucose-Capillary: 229 mg/dL — ABNORMAL HIGH (ref 70–99)
Glucose-Capillary: 154 mg/dL — ABNORMAL HIGH (ref 70–99)
Glucose-Capillary: 235 mg/dL — ABNORMAL HIGH (ref 70–99)
Glucose-Capillary: 218 mg/dL — ABNORMAL HIGH (ref 70–99)

## 2013-11-09 LAB — TSH: TSH: 0.952 u[IU]/mL (ref 0.350–4.500)

## 2013-11-09 MED ORDER — POTASSIUM CHLORIDE CRYS ER 20 MEQ PO TBCR
40.0000 meq | EXTENDED_RELEASE_TABLET | Freq: Once | ORAL | Status: AC
  Administered 2013-11-09: 40 meq via ORAL
  Filled 2013-11-09: qty 2

## 2013-11-09 NOTE — ED Provider Notes (Signed)
Medical screening examination/treatment/procedure(s) were conducted as a shared visit with non-physician practitioner(s) and myself.  I personally evaluated the patient during the encounter.   EKG Interpretation None     Patient presents with necrotic left baby toe.   Discussed with orthopedics. Admit to general medicine.   Nat Christen, MD 11/09/13 [phone]

## 2013-11-09 NOTE — Plan of Care (Signed)
Problem: Food- and Nutrition-Related Knowledge Deficit (NB-1.1) Goal: Nutrition education Formal process to instruct or train a patient/client in a skill or to impart knowledge to help patients/clients voluntarily manage or modify food choices and eating behavior to maintain or improve health.  RD consulted for nutrition education regarding diabetes.     Lab Results  Component Value Date    HGBA1C 10.6* 11/08/2013   Pt reports that she has had diabetes for "a long time", however, unable to specify time frame of diagnosis. She reports that glycemic control varies, but does best when she walks and watches her diet. Diabetes coordinator reported that pt eats one meal per day, but upon further questioning, pt reveals that she eats 3 times per day, but her larges meal is dinner. Breakfast consists of 2 pieces of plain toast (whole wheat), lunch consists of 1-2 pieces of toast (whole wheat, and dinner consists of lunch meat OR pork chop, starch, and vegetable. She reports food insecurity and eats "what I can afford". She disclosed to this RD that she used to receive up to $200 a month in food stamps, but allowance is now around $45 a month. Beverages mainly consist of black coffee and water.   RD provided "Carbohydrate Counting for People with Diabetes" handout from the Academy of [employer]etics. Discussed different food groups and their effects on blood sugar, emphasizing carbohydrate-containing foods. Provided list of carbohydrates and recommended serving sizes of common foods.  Discussed importance of controlled and consistent carbohydrate intake throughout the day. Provided examples of ways to balance meals/snacks and encouraged intake of high-fiber, whole grain complex carbohydrates. Teach back method used.  Expect fair compliance.  Body mass index is 26.62 kg/(m^2). Pt meets criteria for overweight based on current BMI.  Current diet order is carb modifed, patient is consuming  approximately 90-100% of meals at this time. Labs and medications reviewed. No further nutrition interventions warranted at this time. RD contact information provided. If additional nutrition issues arise, please re-consult RD.  Brandy Fisher, RD, LDN Pager: [phone]

## 2013-11-09 NOTE — Progress Notes (Signed)
[facility] PROGRESS NOTE  Brandy Fisher VQQ:[id number] DOB: [birth year] DOA: 11/08/2013 PCP: No PCP Per Patient  Assessment/Plan: 1. Osteomyelitis/gangrene of left 5th toe. Patient was admitted with cellulitis and gangrene of left toe. MRI shows underlying osteomyelitis. Will continue with current IV antibiotics and request a surgical consult. 2. Cellulitis of left foot. Improving with antibiotics. 3. Diabetes type 2. Continue Lantus as well as sliding scale insulin. 4. Hypokalemia. Replace  Code Status: full code Family Communication: no family present Disposition Plan: discharge home once improved   Consultants:  General Surgery  Procedures:    Antibiotics:  Vancomycin 5/6  Zosyn 5/6  HPI/Subjective: Feels that foot is improving. Erythema improving.  Objective: Filed Vitals:   11/09/13 1305  BP: 110/50  Pulse: 65  Temp: 98.1 F (36.7 C)  Resp: 18    Intake/Output Summary (Last 24 hours) at 11/09/13 1448 Last data filed at 11/09/13 1300  Gross per 24 hour  Intake 2751.25 ml  Output   1000 ml  Net 1751.25 ml   Filed Weights   11/08/13 1422 11/08/13 1853  Weight: 78.472 kg (173 lb) 78.3 kg (172 lb 9.9 oz)    Exam:   General:  NAD  Cardiovascular: S1, S2 RRR  Respiratory: CTA B  Abdomen: soft, NT, ND, BS+  Ext: left 5th toe gangrene with surrounding cellulitis   Data Reviewed: Basic Metabolic Panel:  Recent Labs Lab 11/08/13 1542 11/09/13 0503  NA 139 138  K 4.3 3.5*  CL 99 102  CO2 28 26  GLUCOSE 431* 276*  BUN 10 11  CREATININE 0.65 0.60  CALCIUM 9.3 8.5   Liver Function Tests: No results found for this basename: AST, ALT, ALKPHOS, BILITOT, PROT, ALBUMIN,  in the last 168 hours No results found for this basename: LIPASE, AMYLASE,  in the last 168 hours No results found for this basename: AMMONIA,  in the last 168 hours CBC:  Recent Labs Lab 11/08/13 1542  WBC 7.7  NEUTROABS 4.8  HGB 13.1  HCT 37.7  MCV 85.5  PLT  172   Cardiac Enzymes: No results found for this basename: CKTOTAL, CKMB, CKMBINDEX, TROPONINI,  in the last 168 hours BNP (last 3 results) No results found for this basename: PROBNP,  in the last 8760 hours CBG:  Recent Labs Lab 11/08/13 1514 11/08/13 1724 11/08/13 2048 11/09/13 0726 11/09/13 1121  GLUCAP 443* 376* 223* 229* 154*    No results found for this or any previous visit (from the past 240 hour(s)).   Studies: Mr Foot Left Wo Contrast  11/09/2013   CLINICAL DATA:  Diabetic foot. Left toe pain. Left foot ulcer and cellulitis. Osteomyelitis.  EXAM: MRI OF THE LEFT FOREFOOT WITHOUT CONTRAST  TECHNIQUE: Multiplanar, multisequence MR imaging was performed. No intravenous contrast was administered.  COMPARISON:  Radiographs 11/08/2013.  FINDINGS: There is effacement of normal fatty marrow in the distal, middle and distal aspect of the proximal phalanx of the right small toe compatible with osteomyelitis. Other phalanges and metatarsals appear within normal limits. Bone marrow edema is present, best seen on the inversion recovery coronal images. Healed fifth metatarsal fracture is present, also seen on prior plain films. There is no soft tissue abscess. Diffuse edema is present over the lateral dorsal forefoot, compatible with cellulitis.  IMPRESSION: Osteomyelitis of the phalanges of the small toe. Complete effacement of min normal marrow in the middle and distal phalanx with involvement of the distal aspect of the proximal phalanx. Lateral forefoot soft tissue edema compatible  with cellulitis.   Electronically Signed   By: Brandy Fisher M.D.   On: 11/09/2013 08:27   Dg Foot Complete Left  11/08/2013   CLINICAL DATA:  Left foot pain  EXAM: LEFT FOOT - COMPLETE 3+ VIEW  COMPARISON:  09/14/2013  FINDINGS: There is no evidence of fracture or dislocation. There is no evidence of arthropathy or other focal bone abnormality. Soft tissues are unremarkable.  IMPRESSION: No acute abnormality  noted.   Electronically Signed   By: Brandy Fisher M.D.   On: 11/08/2013 14:58    Scheduled Meds: . enoxaparin (LOVENOX) injection  40 mg Subcutaneous Q24H  . insulin aspart  0-15 Units Subcutaneous TID WC  . insulin aspart  0-5 Units Subcutaneous QHS  . insulin glargine  30 Units Subcutaneous QHS  . piperacillin-tazobactam (ZOSYN)  IV  3.375 g Intravenous Q8H  . vancomycin  1,000 mg Intravenous Q12H   Continuous Infusions: . sodium chloride 75 mL/hr at 11/08/13 1857    Principal Problem:   Cellulitis of second toe of left foot Active Problems:   Necrotic toes   Uncontrolled diabetes mellitus type 2 without complications   Hyperglycemia   Tobacco abuse   Cellulitis of toe of left foot   Osteomyelitis of foot, left, acute    Time spent: 68mins    Brandy Fisher  [facility] Pager [phone]. If 7PM-7AM, please contact night-coverage at [email or web address], password [facility] 11/09/2013, 2:48 PM  LOS: 1 day

## 2013-11-09 NOTE — Progress Notes (Addendum)
Inpatient Diabetes Program Recommendations  AACE/ADA: New Consensus Statement on Inpatient Glycemic Control (2013)  Target Ranges:  Prepandial:   less than 140 mg/dL      Peak postprandial:   less than 180 mg/dL (1-2 hours)      Critically ill patients:  140 - 180 mg/dL   Results for Brandy Fisher, Brandy Fisher (MRN [id number]) as of 11/09/2013 09:32  Ref. Range 11/08/2013 15:14 11/08/2013 17:24 11/08/2013 20:48 11/09/2013 07:26  Glucose-Capillary Latest Range: 70-99 mg/dL 443 (H) 376 (H) 223 (H) 229 (H)   Diabetes history: DM2  Outpatient Diabetes medications: Lantus 10 units daily, Novolog 15 units TID, Metformin 500 mg BID Current orders for Inpatient glycemic control: Lantus 30 units QHS, Novolog 0-15 units TID, Novolog 0-5 HS  Inpatient Diabetes Program Recommendations Insulin - Meal Coverage: Please consider ordering Novolog 5 units TID wtih meals for meal coverage.   11/09/13@14 :08-Talked with patient about diabetes and home regimen for diabetes control.  Patient states that she currently takes Lantus 20 units QHS as an outpatient for diabetes management.  According to the patient she is prescribed Lantus 20 units QHS, Novolog 15 units TID, and Metformin 500 mg BID.  She has recently ran out of Metformin and she has not been able to afford her Novolog for years.  Patient recently got Medicaid and will now be able to get her medications if she can get prescriptions.  Patient reports that she goes to the health department for medical care but she wishes to establish care with a PCP.  When asked about monitoring blood glucose at home, pt states that she needs a new glucometer and testing supplies because her old meter does not work.  Discussed A1C results (10.6% on 11/08/13) and explained what an A1C is, basic pathophysiology of DM Type 2, basic home care, importance of checking CBGs and maintaining good CBG control to prevent long-term and short-term complications. Stressed importance of diabetes control and  discussed impact of nutrition, exercise, stress, and sickness on diabetes control.  Patient verbalized understanding of information discussed and states that she has no further questions related to diabetes at this time.  Thanks, Barnie Alderman, RN, MSN, CCRN Diabetes Coordinator Inpatient Diabetes Program [phone] (Team Pager) [phone] ([facility] office) [phone] [facility] office)'

## 2013-11-09 NOTE — Progress Notes (Signed)
UR chart review completed.  

## 2013-11-09 NOTE — Care Management Note (Addendum)
    Page 1 of 1   11/10/2013     12:46:03 PM CARE MANAGEMENT NOTE 11/10/2013  Patient:  Brandy Fisher, Brandy Fisher   Account Number:  [id number]  Date Initiated:  11/09/2013  Documentation initiated by:  Theophilus Kinds  Subjective/Objective Assessment:   Pt admitted from home with cellulitis and possible osteomylitis. Pt lives with her children and will return home at discharge. Pt is independent but does have a cane to use outside the home.     Action/Plan:   CM spoke with pt about ? need for IV AB at discharge. Pt stated that she and her son would be able to learn and administer IV AB at home if needed.   Anticipated DC Date:  11/13/2013   Anticipated DC Plan:  [place]  CM consult      Choice offered to / List presented to:          [facility] arranged  HH-1 RN  IV Antibiotics      [place].   Status of service:  Completed, signed off Medicare Important Message given?  YES (If response is "NO", the following Medicare IM given date fields will be blank) Date Medicare IM given:  11/10/2013 Date Additional Medicare IM given:    Discharge Disposition:  [place]  Per UR Regulation:    If discussed at Long Length of Stay Meetings, dates discussed:    Comments:  11/10/13 [place], RN BSN CM Pt will need longterm IV AB. Pt chooses [facility] RN for IV AB. Romualdo Bolk of [facility] is aware and will collec the pts information from the chart. Weekend staff will send final [place] orders once written for IV AB. Pts PCP also arranged with Dr. Anastasio Champion and appt documented on AVS.  11/09/13 [place], RN BSN CM

## 2013-11-10 LAB — CBC
HCT: 33.7 % — ABNORMAL LOW (ref 36.0–46.0)
Hemoglobin: 11.5 g/dL — ABNORMAL LOW (ref 12.0–15.0)
MCH: 29.1 pg (ref 26.0–34.0)
MCHC: 34.1 g/dL (ref 30.0–36.0)
MCV: 85.3 fL (ref 78.0–100.0)
Platelets: 169 10*3/uL (ref 150–400)
RBC: 3.95 MIL/uL (ref 3.87–5.11)
RDW: 13 % (ref 11.5–15.5)
WBC: 7.4 10*3/uL (ref 4.0–10.5)

## 2013-11-10 LAB — GLUCOSE, CAPILLARY
Glucose-Capillary: 152 mg/dL — ABNORMAL HIGH (ref 70–99)
Glucose-Capillary: 224 mg/dL — ABNORMAL HIGH (ref 70–99)
Glucose-Capillary: 174 mg/dL — ABNORMAL HIGH (ref 70–99)
Glucose-Capillary: 163 mg/dL — ABNORMAL HIGH (ref 70–99)

## 2013-11-10 LAB — BASIC METABOLIC PANEL
BUN: 9 mg/dL (ref 6–23)
CO2: 26 mEq/L (ref 19–32)
Calcium: 8.9 mg/dL (ref 8.4–10.5)
Chloride: 102 mEq/L (ref 96–112)
Creatinine, Ser: 0.63 mg/dL (ref 0.50–1.10)
GFR calc Af Amer: >90 mL/min (ref >90)
GFR calc non Af Amer: >90 mL/min (ref >90)
Glucose, Bld: 172 mg/dL — ABNORMAL HIGH (ref 70–99)
Potassium: 4.2 mEq/L (ref 3.7–5.3)
Sodium: 141 mEq/L (ref 137–147)

## 2013-11-10 LAB — VANCOMYCIN, TROUGH: Vancomycin Tr: 11.1 ug/mL (ref 10.0–20.0)

## 2013-11-10 MED ORDER — ONDANSETRON HCL 4 MG/2ML IJ SOLN
4.0000 mg | Freq: Four times a day (QID) | INTRAMUSCULAR | Status: DC | PRN
  Administered 2013-11-10: 4 mg via INTRAVENOUS
  Filled 2013-11-10: qty 2

## 2013-11-10 MED ORDER — VANCOMYCIN HCL 10 G IV SOLR
1250.0000 mg | Freq: Two times a day (BID) | INTRAVENOUS | Status: DC
  Administered 2013-11-10 – 2013-11-11 (×2): 1250 mg via INTRAVENOUS
  Filled 2013-11-10 (×6): qty 1250

## 2013-11-10 NOTE — Progress Notes (Signed)
[facility] PROGRESS NOTE  Brandy Fisher VOZ:[id number] DOB: January 16, 1956 DOA: 11/08/2013 PCP: No PCP Per Patient  Assessment/Plan: 1. Osteomyelitis/gangrene of left 5th toe. Patient was admitted with cellulitis and gangrene of left toe. MRI shows underlying osteomyelitis. Appreciate surgical consult. Plans are to continue for outpatient course of IV antibiotics and close followup with surgery. Patient will need amputation if she fails to improve with antibiotics. We'll place PICC line for long-term antibiotics. She will likely be discharged on IV vancomycin and oral levofloxacin. 2. Cellulitis of left foot. Significantly improved with antibiotics. 3. Diabetes type 2. Continue Lantus as well as sliding scale insulin. 4. Hypokalemia. Replace  Code Status: full code Family Communication: no family present Disposition Plan: discharge home once improved   Consultants:  General Surgery  Procedures:    Antibiotics:  Vancomycin 5/6  Zosyn 5/6  HPI/Subjective: No new complaints. Feels that her foot is improving.  Objective: Filed Vitals:   11/10/13 1424  BP: 128/62  Pulse: 68  Temp: 97.9 F (36.6 C)  Resp: 20    Intake/Output Summary (Last 24 hours) at 11/10/13 1912 Last data filed at 11/10/13 1800  Gross per 24 hour  Intake 2127.5 ml  Output   1300 ml  Net  827.5 ml   Filed Weights   11/08/13 1422 11/08/13 1853  Weight: 78.472 kg (173 lb) 78.3 kg (172 lb 9.9 oz)    Exam:   General:  NAD  Cardiovascular: S1, S2 RRR  Respiratory: CTA B  Abdomen: soft, NT, ND, BS+  Ext: left 5th toe gangrene. Surrounding erythema has significantly improved   Data Reviewed: Basic Metabolic Panel:  Recent Labs Lab 11/08/13 1542 11/09/13 0503 11/10/13 0509  NA 139 138 141  K 4.3 3.5* 4.2  CL 99 102 102  CO2 28 26 26   GLUCOSE 431* 276* 172*  BUN 10 11 9   CREATININE 0.65 0.60 0.63  CALCIUM 9.3 8.5 8.9   Liver Function Tests: No results found for this  basename: AST, ALT, ALKPHOS, BILITOT, PROT, ALBUMIN,  in the last 168 hours No results found for this basename: LIPASE, AMYLASE,  in the last 168 hours No results found for this basename: AMMONIA,  in the last 168 hours CBC:  Recent Labs Lab 11/08/13 1542 11/10/13 0509  WBC 7.7 7.4  NEUTROABS 4.8  --   HGB 13.1 11.5*  HCT 37.7 33.7*  MCV 85.5 85.3  PLT 172 169   Cardiac Enzymes: No results found for this basename: CKTOTAL, CKMB, CKMBINDEX, TROPONINI,  in the last 168 hours BNP (last 3 results) No results found for this basename: PROBNP,  in the last 8760 hours CBG:  Recent Labs Lab 11/09/13 1615 11/09/13 2059 11/10/13 0743 11/10/13 1208 11/10/13 1647  GLUCAP 235* 218* 152* 224* 174*    No results found for this or any previous visit (from the past 240 hour(s)).   Studies: No results found.  Scheduled Meds: . enoxaparin (LOVENOX) injection  40 mg Subcutaneous Q24H  . insulin aspart  0-15 Units Subcutaneous TID WC  . insulin aspart  0-5 Units Subcutaneous QHS  . insulin glargine  30 Units Subcutaneous QHS  . piperacillin-tazobactam (ZOSYN)  IV  3.375 g Intravenous Q8H  . vancomycin  1,250 mg Intravenous Q12H   Continuous Infusions:    Principal Problem:   Cellulitis of second toe of left foot Active Problems:   Necrotic toes   Uncontrolled diabetes mellitus type 2 without complications   Hyperglycemia   Tobacco abuse   Cellulitis  of toe of left foot   Osteomyelitis of foot, left, acute    Time spent: 28mins    Brandy Fisher  [facility] Pager [phone]. If 7PM-7AM, please contact night-coverage at [email or web address], password [facility] 11/10/2013, 7:12 PM  LOS: 2 days

## 2013-11-10 NOTE — Progress Notes (Addendum)
ANTIBIOTIC CONSULT NOTE - follow up  Pharmacy Consult for Vancomycin & Zosyn Indication: cellulitis / osteomyelitis  Allergies  Allergen Reactions  . Morphine And Related Nausea And Vomiting    Patient Measurements: Height: 5' 7.5" (171.5 cm) Weight: 172 lb 9.9 oz (78.3 kg) IBW/kg (Calculated) : 62.75  Vital Signs: Temp: 97.9 F (36.6 C) (05/08 1424) Temp src: Oral (05/08 1424) BP: 128/62 mmHg (05/08 1424) Pulse Rate: 68 (05/08 1424) Intake/Output from previous day: 05/07 0701 - 05/08 0700 In: 3863.8 [P.O.:1680; I.V.:1883.8; IV Piggyback:300] Out: 1500 [Urine:1500] Intake/Output from this shift:    Labs:  Recent Labs  11/08/13 1542 11/09/13 0503 11/10/13 0509  WBC 7.7  --  7.4  HGB 13.1  --  11.5*  PLT 172  --  169  CREATININE 0.65 0.60 0.63   Estimated Creatinine Clearance: 81.5 ml/min (by C-G formula based on Cr of 0.63).  Recent Labs  11/10/13 1505  [place] 11.1     Microbiology: No results found for this or any previous visit (from the past 720 hour(s)).  Medical History: Past Medical History  Diagnosis Date  . Diabetes mellitus   . Arthritis   . Osteomyelitis of foot, left, acute 11/09/2013    Medications:  Scheduled:  . enoxaparin (LOVENOX) injection  40 mg Subcutaneous Q24H  . insulin aspart  0-15 Units Subcutaneous TID WC  . insulin aspart  0-5 Units Subcutaneous QHS  . insulin glargine  30 Units Subcutaneous QHS  . piperacillin-tazobactam (ZOSYN)  IV  3.375 g Intravenous Q8H  . vancomycin  1,000 mg Intravenous Q12H   Assessment: 58 yo F with pain, redness, & draining blister of left 2nd toe. MRI + osteo.  Patient is afebrile with normal WBC. Renal function is at patient's baseline.  Estimated Creatinine Clearance: 81.5 ml/min (by C-G formula based on Cr of 0.63). Trough level is below goal for osteo.    Vancomycin 5/6>> Zosyn 5/6>>  Goal of Therapy:  Vancomycin 15-20 mcg/ml  Plan:  Continue Zosyn 3.375gm IV Q8h to be infused  over 4hrs Increase Vancomycin to 1250mg  IV q12h Check Vancomycin trough weekly or sooner if indicated Monitor renal function and cx data   [employer] 11/10/2013,4:06 PM

## 2013-11-10 NOTE — Progress Notes (Signed)
Inpatient Diabetes Program Recommendations  AACE/ADA: New Consensus Statement on Inpatient Glycemic Control (2013)  Target Ranges:  Prepandial:   less than 140 mg/dL      Peak postprandial:   less than 180 mg/dL (1-2 hours)      Critically ill patients:  140 - 180 mg/dL   Results for Brandy Fisher, Brandy Fisher (MRN [id number]) as of 11/10/2013 08:39  Ref. Range 11/09/2013 07:26 11/09/2013 11:21 11/09/2013 16:15 11/09/2013 20:59 11/10/2013 07:43  Glucose-Capillary Latest Range: 70-99 mg/dL 229 (H) 154 (H) 235 (H) 218 (H) 152 (H)   Diabetes history: DM2  Outpatient Diabetes medications: Lantus 10 units daily, Novolog 15 units TID, Metformin 500 mg BID  Current orders for Inpatient glycemic control: Lantus 30 units QHS, Novolog 0-15 units TID, Novolog 0-5 HS  Inpatient Diabetes Program Recommendations Insulin - Meal Coverage: Please consider ordering Novolog 5 units TID wtih meals for meal coverage.  Note: Post Prandial glucose consistently elevated.  Please consider ordering Novolog meal coverage.   Thanks, Barnie Alderman, RN, MSN, CCRN Diabetes Coordinator Inpatient Diabetes Program [phone] (Team Pager) [phone] ([facility] office) [phone] [facility] office)

## 2013-11-10 NOTE — Consult Note (Signed)
Reason for Consult: Gangrene, left fifth toe Referring Physician: Hospitalist  Brandy Fisher is an 58 y.o. female.  HPI: Patient is a 58 year old white female with multiple medical problems including uncontrolled diabetes mellitus who states that 2 months ago, she injured her left fifth toe. She states has been bothering her since that time. She receives her medical care through the [facility].  Past Medical History  Diagnosis Date  . Diabetes mellitus   . Arthritis   . Osteomyelitis of foot, left, acute 11/09/2013    Past Surgical History  Procedure Laterality Date  . Cesarean section    . Breast surgery      Left breast biopsy, negative  . Cholecystectomy    . Hip arthroplasty  03/16/2011    Procedure: ARTHROPLASTY BIPOLAR HIP;  Surgeon: Sanjuana Kava;  Location: [facility];  Service: Orthopedics;  Laterality: Right;    Family History  Problem Relation Age of Onset  . Diabetes Mother     Social History:  reports that she has been smoking Cigarettes.  She has a 12.5 pack-year smoking history. She does not have any smokeless tobacco history on file. She reports that she does not drink alcohol or use illicit drugs.  Allergies:  Allergies  Allergen Reactions  . Morphine And Related Nausea And Vomiting    Medications: I have reviewed the patient's current medications.  Results for orders placed during the hospital encounter of 11/08/13 (from the past 48 hour(s))  CBG MONITORING, ED     Status: Abnormal   Collection Time    11/08/13  3:14 PM      Result Value Ref Range   Glucose-Capillary 443 (*) 70 - 99 mg/dL  BASIC METABOLIC PANEL     Status: Abnormal   Collection Time    11/08/13  3:42 PM      Result Value Ref Range   Sodium 139  137 - 147 mEq/L   Potassium 4.3  3.7 - 5.3 mEq/L   Chloride 99  96 - 112 mEq/L   CO2 28  19 - 32 mEq/L   Glucose, Bld 431 (*) 70 - 99 mg/dL   BUN 10  6 - 23 mg/dL   Creatinine, Ser 0.65  0.50 - 1.10 mg/dL   Calcium 9.3  8.4 -  10.5 mg/dL   GFR calc non Af Amer >90  >90 mL/min   GFR calc Af Amer >90  >90 mL/min   Comment: (NOTE)     The eGFR has been calculated using the CKD EPI equation.     This calculation has not been validated in all clinical situations.     eGFR's persistently <90 mL/min signify possible Chronic Kidney     Disease.  CBC WITH DIFFERENTIAL     Status: None   Collection Time    11/08/13  3:42 PM      Result Value Ref Range   WBC 7.7  4.0 - 10.5 K/uL   RBC 4.41  3.87 - 5.11 MIL/uL   Hemoglobin 13.1  12.0 - 15.0 g/dL   HCT 37.7  36.0 - 46.0 %   MCV 85.5  78.0 - 100.0 fL   MCH 29.7  26.0 - 34.0 pg   MCHC 34.7  30.0 - 36.0 g/dL   RDW 13.0  11.5 - 15.5 %   Platelets 172  150 - 400 K/uL   Neutrophils Relative % 62  43 - 77 %   Neutro Abs 4.8  1.7 - 7.7 K/uL  Lymphocytes Relative 31  12 - 46 %   Lymphs Abs 2.4  0.7 - 4.0 K/uL   Monocytes Relative 6  3 - 12 %   Monocytes Absolute 0.5  0.1 - 1.0 K/uL   Eosinophils Relative 1  0 - 5 %   Eosinophils Absolute 0.1  0.0 - 0.7 K/uL   Basophils Relative 0  0 - 1 %   Basophils Absolute 0.0  0.0 - 0.1 K/uL  HEMOGLOBIN A1C     Status: Abnormal   Collection Time    11/08/13  5:13 PM      Result Value Ref Range   Hemoglobin A1C 10.6 (*) <5.7 %   Comment: (NOTE)                                                                               According to the ADA Clinical Practice Recommendations for 2011, when     HbA1c is used as a screening test:      >=6.5%   Diagnostic of Diabetes Mellitus               (if abnormal result is confirmed)     5.7-6.4%   Increased risk of developing Diabetes Mellitus     References:Diagnosis and Classification of Diabetes Mellitus,Diabetes     WUJW,1191,47(WGNFA 1):S62-S69 and Standards of Medical Care in             Diabetes - 2011,Diabetes OZHY,8657,84 (Suppl 1):S11-S61.   Mean Plasma Glucose 258 (*) <117 mg/dL   Comment: Performed at [facility]  TSH     Status: None   Collection Time    11/08/13   5:13 PM      Result Value Ref Range   TSH 0.952  0.350 - 4.500 uIU/mL   Comment: Please note change in reference range.     Performed at [place], ED     Status: Abnormal   Collection Time    11/08/13  5:24 PM      Result Value Ref Range   Glucose-Capillary 376 (*) 70 - 99 mg/dL  GLUCOSE, CAPILLARY     Status: Abnormal   Collection Time    11/08/13  8:48 PM      Result Value Ref Range   Glucose-Capillary 223 (*) 70 - 99 mg/dL  BASIC METABOLIC PANEL     Status: Abnormal   Collection Time    11/09/13  5:03 AM      Result Value Ref Range   Sodium 138  137 - 147 mEq/L   Potassium 3.5 (*) 3.7 - 5.3 mEq/L   Comment: DELTA CHECK NOTED   Chloride 102  96 - 112 mEq/L   CO2 26  19 - 32 mEq/L   Glucose, Bld 276 (*) 70 - 99 mg/dL   BUN 11  6 - 23 mg/dL   Creatinine, Ser 0.60  0.50 - 1.10 mg/dL   Calcium 8.5  8.4 - 10.5 mg/dL   GFR calc non Af Amer >90  >90 mL/min   GFR calc Af Amer >90  >90 mL/min   Comment: (NOTE)     The eGFR has been calculated using the CKD EPI  equation.     This calculation has not been validated in all clinical situations.     eGFR's persistently <90 mL/min signify possible Chronic Kidney     Disease.  GLUCOSE, CAPILLARY     Status: Abnormal   Collection Time    11/09/13  7:26 AM      Result Value Ref Range   Glucose-Capillary 229 (*) 70 - 99 mg/dL  GLUCOSE, CAPILLARY     Status: Abnormal   Collection Time    11/09/13 11:21 AM      Result Value Ref Range   Glucose-Capillary 154 (*) 70 - 99 mg/dL  GLUCOSE, CAPILLARY     Status: Abnormal   Collection Time    11/09/13  4:15 PM      Result Value Ref Range   Glucose-Capillary 235 (*) 70 - 99 mg/dL  GLUCOSE, CAPILLARY     Status: Abnormal   Collection Time    11/09/13  8:59 PM      Result Value Ref Range   Glucose-Capillary 218 (*) 70 - 99 mg/dL  BASIC METABOLIC PANEL     Status: Abnormal   Collection Time    11/10/13  5:09 AM      Result Value Ref Range   Sodium 141  137 -  147 mEq/L   Potassium 4.2  3.7 - 5.3 mEq/L   Chloride 102  96 - 112 mEq/L   CO2 26  19 - 32 mEq/L   Glucose, Bld 172 (*) 70 - 99 mg/dL   BUN 9  6 - 23 mg/dL   Creatinine, Ser 0.63  0.50 - 1.10 mg/dL   Calcium 8.9  8.4 - 10.5 mg/dL   GFR calc non Af Amer >90  >90 mL/min   GFR calc Af Amer >90  >90 mL/min   Comment: (NOTE)     The eGFR has been calculated using the CKD EPI equation.     This calculation has not been validated in all clinical situations.     eGFR's persistently <90 mL/min signify possible Chronic Kidney     Disease.  CBC     Status: Abnormal   Collection Time    11/10/13  5:09 AM      Result Value Ref Range   WBC 7.4  4.0 - 10.5 K/uL   RBC 3.95  3.87 - 5.11 MIL/uL   Hemoglobin 11.5 (*) 12.0 - 15.0 g/dL   HCT 33.7 (*) 36.0 - 46.0 %   MCV 85.3  78.0 - 100.0 fL   MCH 29.1  26.0 - 34.0 pg   MCHC 34.1  30.0 - 36.0 g/dL   RDW 13.0  11.5 - 15.5 %   Platelets 169  150 - 400 K/uL  GLUCOSE, CAPILLARY     Status: Abnormal   Collection Time    11/10/13  7:43 AM      Result Value Ref Range   Glucose-Capillary 152 (*) 70 - 99 mg/dL   Comment 1 Notify RN      Mr Foot Left Wo Contrast  11/09/2013   CLINICAL DATA:  Diabetic foot. Left toe pain. Left foot ulcer and cellulitis. Osteomyelitis.  EXAM: MRI OF THE LEFT FOREFOOT WITHOUT CONTRAST  TECHNIQUE: Multiplanar, multisequence MR imaging was performed. No intravenous contrast was administered.  COMPARISON:  Radiographs 11/08/2013.  FINDINGS: There is effacement of normal fatty marrow in the distal, middle and distal aspect of the proximal phalanx of the right small toe compatible with osteomyelitis. Other phalanges and metatarsals appear within  normal limits. Bone marrow edema is present, best seen on the inversion recovery coronal images. Healed fifth metatarsal fracture is present, also seen on prior plain films. There is no soft tissue abscess. Diffuse edema is present over the lateral dorsal forefoot, compatible with cellulitis.   IMPRESSION: Osteomyelitis of the phalanges of the small toe. Complete effacement of min normal marrow in the middle and distal phalanx with involvement of the distal aspect of the proximal phalanx. Lateral forefoot soft tissue edema compatible with cellulitis.   Electronically Signed   By: Dereck Ligas M.D.   On: 11/09/2013 08:27   Dg Foot Complete Left  11/08/2013   CLINICAL DATA:  Left foot pain  EXAM: LEFT FOOT - COMPLETE 3+ VIEW  COMPARISON:  09/14/2013  FINDINGS: There is no evidence of fracture or dislocation. There is no evidence of arthropathy or other focal bone abnormality. Soft tissues are unremarkable.  IMPRESSION: No acute abnormality noted.   Electronically Signed   By: Inez Catalina M.D.   On: 11/08/2013 14:58    ROS: See chart Blood pressure 134/75, pulse 83, temperature 97.6 F (36.4 C), temperature source Oral, resp. rate 18, height 5' 7.5" (1.715 m), weight 78.3 kg (172 lb 9.9 oz), SpO2 94.00%. Physical Exam: Pleasant white female in no acute distress. Extremity examination reveals palpable left femoral, dorsalis pedis, posterior tibial pulses. No open wounds are present. She does have dry gangrene of the distal top of the left fifth toe. Mild swelling at the base of the left fifth toe is noted, though no open wounds are present. It is mildly tender to touch.  Assessment/Plan: Impression: Dry gangrene, left fifth toe, osteomyelitis of left fifth toe Plan: Patient is very resistant to amputation of left fifth toe at this time. Would consider 4-6 week course of antibiotic therapy for osteomyelitis. I would be more happy to follow this patient as an outpatient.  Jamesetta So 11/10/2013, 10:06 AM

## 2013-11-11 ENCOUNTER — Encounter (HOSPITAL_COMMUNITY): Payer: Medicare Other

## 2013-11-11 ENCOUNTER — Inpatient Hospital Stay (HOSPITAL_COMMUNITY): Payer: Medicare Other

## 2013-11-11 LAB — GLUCOSE, CAPILLARY
Glucose-Capillary: 155 mg/dL — ABNORMAL HIGH (ref 70–99)
Glucose-Capillary: 158 mg/dL — ABNORMAL HIGH (ref 70–99)

## 2013-11-11 MED ORDER — INSULIN GLARGINE 100 UNIT/ML ~~LOC~~ SOLN: 30.0000 [IU] | Freq: Every day | SUBCUTANEOUS | Status: DC

## 2013-11-11 MED ORDER — LEVOFLOXACIN 750 MG PO TABS: 750.0000 mg | ORAL_TABLET | Freq: Every day | ORAL | Status: DC

## 2013-11-11 MED ORDER — SODIUM CHLORIDE 0.9 % IJ SOLN
10.0000 mL | Freq: Two times a day (BID) | INTRAMUSCULAR | Status: DC
  Administered 2013-11-11: 10 mL

## 2013-11-11 MED ORDER — VANCOMYCIN HCL 10 G IV SOLR: 1250.0000 mg | Freq: Two times a day (BID) | INTRAVENOUS | Status: DC

## 2013-11-11 MED ORDER — METFORMIN HCL 500 MG PO TABS: 500.0000 mg | ORAL_TABLET | Freq: Two times a day (BID) | ORAL | Status: DC

## 2013-11-11 MED ORDER — INSULIN ASPART 100 UNIT/ML ~~LOC~~ SOLN: 5.0000 [IU] | Freq: Three times a day (TID) | SUBCUTANEOUS | Status: DC

## 2013-11-11 MED ORDER — SODIUM CHLORIDE 0.9 % IJ SOLN: 10.0000 mL | INTRAMUSCULAR | Status: DC | PRN

## 2013-11-11 MED ORDER — OXYCODONE HCL 5 MG PO TABS: 5.0000 mg | ORAL_TABLET | Freq: Four times a day (QID) | ORAL | Status: DC | PRN

## 2013-11-11 NOTE — Discharge Instructions (Signed)
Bone and Joint Infections °Joint infections are called septic or infectious arthritis. An infected joint may damage cartilage and tissue very quickly. This may destroy the joint. Bone infections (osteomyelitis) may last for years. Joints may become stiff if left untreated. Bacteria are the most common cause. Other causes include viruses and fungi, but these are more rare. Bone and joint infections usually come from injury or infection elsewhere in your body; the germs are carried to your bones or joints through the bloodstream.  °CAUSES  °· Blood-carried germs from an infection elsewhere in your body can eventually spread to a bone or joint. The germ staphylococcus is the most common cause of both osteomyelitis and septic arthritis. °· An injury can introduce germs into your bones or joints. °SYMPTOMS  °· Weight loss. °· Tiredness. °· Chills and fever. °· Bone or joint pain at rest and with activity. °· Tenderness when touching the area or bending the joint. °· Refusal to bear weight on a leg or inability to use an arm due to pain. °· Decreased range of motion in a joint. °· Skin redness, warmth, and tenderness. °· Open skin sores and drainage. °RISK FACTORS °Children, the elderly, and those with weak immune systems are at increased risk of bone and joint infections. It is more common in people with HIV infections and with people on chemotherapy. People are also at increased risk if they have surgery where metal implants are used to stabilize the bone. Plates, screws, or artificial joints provide a surface that bacteria can stick on. Such a growth of bacteria is called biofilm. The biofilm protects bacteria from antibiotics and bodily defenses. This allows germs to multiply. Other reasons for increased risks include:  °· Having previous surgery or injury of a bone or joint. °· Being on high-dose corticosteroids and immunosuppressive medications that weaken your body's resistance to germs. °· Diabetes and  long-standing diseases. °· Use of intravenous street drugs. °· Being on hemodialysis. °· Having a history of urinary tract infections. °· Removal of your spleen (splenectomy). This weakens your immunity. °· Chronic viral infections such as HIV or AIDS. °· Lack of sensation such as paraplegia, quadriplegia, or spina bifida. °DIAGNOSIS  °· Increased numbers of white blood cells in your blood may indicate infection. Some times your caregivers are able to identify the infecting germs by testing your blood. Inflammatory markers present in your bloodstream such as an erythrocyte sedimentation rate (ESR or sed rate) or c-reactive protein (CRP) can be indicators of deep infection. °· Bone scans and X-ray exams are necessary for diagnosing osteomyelitis. They may help your caregiver find the infected areas. Other studies may give more detailed information. They may help detect fluid collections around a joint, abnormal bone surfaces, or be useful in diagnosing septic arthritis. They can find soft tissue swelling and find excess fluid in an infected joint or the adjacent bone. These tests include: °· Ultrasound. °· CT (computerized tomography). °· MRI (magnetic resonance imaging). °· The best test for diagnosing a bone or joint infection is an aspiration or biopsy. Your caregiver will usually use a local anesthetic. He or she can then remove tissue from a bone injury or use a needle to take fluids from an infected joint. A local anesthetic medication numbs the area to be biopsied. Often biopsies are done in the operating room under general anesthesia. This means you will be asleep during the procedure. Tests performed on these samples can identify an infection. °TREATMENT  °· Treatment can help control long-standing   infections, but infections may come back. °· Infections can infect any bone or joint at any age. °· Bone and joint infections are rarely fatal. °· Bone infection left untreated can become a never-ending infection.  It can spread to other areas of your body. It may eventually cause bone death. Reduced limb or joint function can result. In severe cases, this may require removal of a limb. Spinal osteomyelitis is very dangerous. Untreated, it may damage spinal nerves and cause death. °· The most common complication of septic arthritis is osteoarthritis with pain and decreased range of motion of the joint. °Some forms of treatment may include: °· If the infection is caused by bacteria, it is generally treated with antibiotics. You will likely receive the drugs through a vein (intravenously) for anywhere from 2 to 6 weeks. In some cases, especially with children, oral antibiotics following an initial intravenous dose may be effective. The treatment you receive depends on the: °· Type of bacteria. °· Location of the infection. °· Type of surgery that might be done. °· Other health conditions or issues you might have. °· Your caregiver may drain soft tissue abscesses or pockets of fluid around infected bones or joints. If you have septic arthritis, your caregiver may use a needle to drain pus from the joint on a daily basis. He or she may use an arthroscope to clean the joint or may need to open the joint surgically to remove damaged tissue and infection. An arthroscope is an instrument like a thin lighted telescope. It can be used to look inside the joint. °· Surgery is usually needed if the infection has become long-standing. It may also be needed if there is hardware (such as metal plates, screws, or artificial joints) inside the patient. Sometimes a bone or muscle graft is needed to fill in the open space. This promotes growth of new tissues and better blood flow to the area. °PREVENTION  °· Clean and disinfect wounds quickly to help prevent the start of a bone or joint infection. Get treatment for any infections to prevent spread to a bone or joint. °· Do not smoke. Smoking decreases healing rates of bone and predisposes to  infection. °· When given medications that suppress your immune system, use them according to your caregiver's instructions. Do not take more than prescribed for your condition. °· Take good care of your feet and skin, especially if you have diabetes, decreased sensation or circulation problems. °SEEK IMMEDIATE MEDICAL CARE IF:  °· You cannot bear weight on a leg or use an arm, especially following a minor injury. This can be a sign of bone or joint infection. °· You think you may have signs or symptoms of a bone or joint infection. Your chance of getting rid of an infection is better if treated early. °Document Released: 06/22/2005 Document Revised: 09/14/2011 Document Reviewed: 05/22/2009 °[employer]® Patient Information ©2014 [employer]. ° °

## 2013-11-11 NOTE — Discharge Summary (Signed)
Physician Discharge Summary  ALAIRA LEVEL MGQ:[id number] DOB: 03/01/1956 DOA: 11/08/2013  PCP: No PCP Per Patient  Admit date: 11/08/2013 Discharge date: 11/11/2013  Time spent: 45 minutes  Recommendations for Outpatient Follow-up:  1. Follow up with general surgery in the next few weeks 2. Follow up with primary care doctor in 1-2 weeks.  Patient has been set up to see Dr. Anastasio Champion 3. [facility] will assist with home antibiotics  Discharge Diagnoses:  Principal Problem:   Cellulitis of second toe of left foot Active Problems:   Necrotic toes   Uncontrolled diabetes mellitus type 2 without complications   Hyperglycemia   Tobacco abuse   Cellulitis of toe of left foot   Osteomyelitis of foot, left, acute   Discharge Condition: stable  Diet recommendation: low salt, low carb  Filed Weights   11/08/13 1422 11/08/13 1853  Weight: 78.472 kg (173 lb) 78.3 kg (172 lb 9.9 oz)    History of present illness:  58 year old female with uncontrolled diabetes mellitus (recent hemoglobin A1c, on insulin) presented to the ED with pain and redness over left small toe progressive for past one month. She reports that her grandson stomped on the area following which she developed a small blister which initially turned white and subsequently became red and painful and for the possible week or so it has been draining yellowish discharge. She reports increased pain to the area with difficulty ambulating. She reports checking her blood sugar infrequently ( few times a month) and takes Lantus 20 units at bedtime and pre-meal insulin depending upon her blood glucose. She reports during that times she has checked her blood glucose, they run from 150-250. She reports polyuria and polydipsia and wakes up almost 5-6 times during the night to urinate. She reports numbness and burning sensation in her feet. She denies any bloody vision. She denies any fever or chills.  Patient denies headache, dizziness,  nausea , vomiting, chest pain, palpitations, SOB, abdominal pain, bowel symptoms. Denies change in weight or appetite.   Hospital Course:  This patient was admitted to the hospital with cellulitis of her left foot and dry gangrene of her fifth toe. She was started on intravenous antibiotics of vancomycin and Zosyn. She was seen by general surgery. She was extremely resistant to agitation. MRI of the left foot indicating underlying osteomyelitis of the fifth toe. Recommendations were for prolonged course of intravenous antibiotics. The patient will follow up with general surgery the outpatient setting in the next few weeks. I suspect she will likely need surgical intervention to definitively treat this. A PICC line has been placed on antibiotics. Home health has been arranged.  Blood sugars are better controlled with adjustments in her Lantus and NovoLog dosing.  Procedures:  PICC line placement on 5/9  Consultations:  General Surgery  Discharge Exam: Filed Vitals:   11/11/13 1300  BP: 127/77  Pulse: 66  Temp: 98.1 F (36.7 C)  Resp: 20    General: NAD Cardiovascular: S1, S2 RRR Respiratory: CTA B  Discharge Instructions You were cared for by a hospitalist during your hospital stay. If you have any questions about your discharge medications or the care you received while you were in the hospital after you are discharged, you can call the unit and asked to speak with the hospitalist on call if the hospitalist that took care of you is not available. Once you are discharged, your primary care physician will handle any further medical issues. Please note that NO REFILLS  for any discharge medications will be authorized once you are discharged, as it is imperative that you return to your primary care physician (or establish a relationship with a primary care physician if you do not have one) for your aftercare needs so that they can reassess your need for medications and monitor your lab  values.  Discharge Orders   Future Orders Complete By Expires   Call MD for:  redness, tenderness, or signs of infection (pain, swelling, redness, odor or green/yellow discharge around incision site)  As directed    Call MD for:  severe uncontrolled pain  As directed    Call MD for:  temperature >100.4  As directed    Diet - low sodium heart healthy  As directed    Face-to-face encounter (required for Medicare/Medicaid patients)  As directed    Questions:     The encounter with the patient was in whole, or in part, for the following medical condition, which is the primary reason for home health care:  osteomyelitis   I certify that, based on my findings, the following services are medically necessary home health services:  Nursing   My clinical findings support the need for the above services:  Pain interferes with ambulation/mobility   Further, I certify that my clinical findings support that this patient is homebound due to:  Pain interferes with ambulation/mobility   Reason for Medically Necessary Home Health Services:  Skilled Nursing- Assessment and Training for Infusion Therapy, Line Care, and Infection [place]  As directed    Questions:     To provide the following care/treatments:  RN   Increase activity slowly  As directed        Medication List         ibuprofen 200 MG tablet  Commonly known as:  ADVIL,MOTRIN  Take 800 mg by mouth every 6 (six) hours as needed. pain     insulin aspart 100 UNIT/ML injection  Commonly known as:  novoLOG  Inject 5 Units into the skin 3 (three) times daily before meals.     insulin glargine 100 UNIT/ML injection  Commonly known as:  LANTUS  Inject 0.3 mLs (30 Units total) into the skin daily.     levofloxacin 750 MG tablet  Commonly known as:  LEVAQUIN  Take 1 tablet (750 mg total) by mouth daily.     metFORMIN 500 MG tablet  Commonly known as:  GLUCOPHAGE  Take 1 tablet (500 mg total) by mouth 2 (two) times daily with a  meal.     oxyCODONE 5 MG immediate release tablet  Commonly known as:  Oxy IR/ROXICODONE  Take 1 tablet (5 mg total) by mouth every 6 (six) hours as needed for moderate pain.     vancomycin 1,250 mg in sodium chloride 0.9 % 250 mL  Inject 1,250 mg into the vein every 12 (twelve) hours. Until 6/17       Allergies  Allergen Reactions  . Morphine And Related Nausea And Vomiting       Follow-up Information   Follow up with [facility].   Contact information:   [address] [place] [address] [phone]       Follow up with Doree Albee, MD On 11/20/2013. (at 11:30)    Specialty:  Internal Medicine   Contact information:   [place] [address] [phone]       Follow up with Jamesetta So, MD. (As needed)  Specialty:  General Surgery   Contact information:   [address] [place] [address] [phone]        The results of significant diagnostics from this hospitalization (including imaging, microbiology, ancillary and laboratory) are listed below for reference.    Significant Diagnostic Studies: Mr Foot Left Wo Contrast  11/09/2013   CLINICAL DATA:  Diabetic foot. Left toe pain. Left foot ulcer and cellulitis. Osteomyelitis.  EXAM: MRI OF THE LEFT FOREFOOT WITHOUT CONTRAST  TECHNIQUE: Multiplanar, multisequence MR imaging was performed. No intravenous contrast was administered.  COMPARISON:  Radiographs 11/08/2013.  FINDINGS: There is effacement of normal fatty marrow in the distal, middle and distal aspect of the proximal phalanx of the right small toe compatible with osteomyelitis. Other phalanges and metatarsals appear within normal limits. Bone marrow edema is present, best seen on the inversion recovery coronal images. Healed fifth metatarsal fracture is present, also seen on prior plain films. There is no soft tissue abscess. Diffuse edema is present over the lateral dorsal forefoot, compatible with  cellulitis.  IMPRESSION: Osteomyelitis of the phalanges of the small toe. Complete effacement of min normal marrow in the middle and distal phalanx with involvement of the distal aspect of the proximal phalanx. Lateral forefoot soft tissue edema compatible with cellulitis.   Electronically Signed   By: Dereck Ligas M.D.   On: 11/09/2013 08:27   Dg Chest Port 1 View  11/11/2013   CLINICAL DATA:  PICC placement  EXAM: PORTABLE CHEST - 1 VIEW  COMPARISON:  prior chest x-ray 03/15/2011  FINDINGS: Interval placement of a right upper extremity PICC. Catheter tip projects over the superior cavoatrial junction. Cardiac and mediastinal contours are within normal limits. Mild prominence of the interstitial markings appear similar compared to prior. No focal airspace consolidation, pleural effusion or pneumothorax. No acute osseous abnormality.  IMPRESSION: The tip of the right upper extremity PICC projects over the superior cavoatrial junction   Electronically Signed   By: Jacqulynn Cadet M.D.   On: 11/11/2013 11:18   Dg Foot Complete Left  11/08/2013   CLINICAL DATA:  Left foot pain  EXAM: LEFT FOOT - COMPLETE 3+ VIEW  COMPARISON:  09/14/2013  FINDINGS: There is no evidence of fracture or dislocation. There is no evidence of arthropathy or other focal bone abnormality. Soft tissues are unremarkable.  IMPRESSION: No acute abnormality noted.   Electronically Signed   By: Inez Catalina M.D.   On: 11/08/2013 14:58    Microbiology: No results found for this or any previous visit (from the past 240 hour(s)).   Labs: Basic Metabolic Panel:  Recent Labs Lab 11/08/13 1542 11/09/13 0503 11/10/13 0509  NA 139 138 141  K 4.3 3.5* 4.2  CL 99 102 102  CO2 28 26 26   GLUCOSE 431* 276* 172*  BUN 10 11 9   CREATININE 0.65 0.60 0.63  CALCIUM 9.3 8.5 8.9   Liver Function Tests: No results found for this basename: AST, ALT, ALKPHOS, BILITOT, PROT, ALBUMIN,  in the last 168 hours No results found for this basename:  LIPASE, AMYLASE,  in the last 168 hours No results found for this basename: AMMONIA,  in the last 168 hours CBC:  Recent Labs Lab 11/08/13 1542 11/10/13 0509  WBC 7.7 7.4  NEUTROABS 4.8  --   HGB 13.1 11.5*  HCT 37.7 33.7*  MCV 85.5 85.3  PLT 172 169   Cardiac Enzymes: No results found for this basename: CKTOTAL, CKMB, CKMBINDEX, TROPONINI,  in the last 168  hours BNP: BNP (last 3 results) No results found for this basename: PROBNP,  in the last 8760 hours CBG:  Recent Labs Lab 11/10/13 1208 11/10/13 1647 11/10/13 2050 11/11/13 0751 11/11/13 1151  GLUCAP 224* 174* 163* 155* 158*       Signed:  Shaughn Thomley  [facility] 11/11/2013, 3:20 PM

## 2013-11-11 NOTE — Progress Notes (Signed)
Late Entry:     Patient expressed concerns about needing a new glucose machine and was suppose to get a script.  I voiced to her that I would have to notify the MD.  The patients ride (her son) would not wait for the MD to return the call, so I told her that if he orders the monitor I would leave the script at the desk so she or someone can pick it up at their earliest convince.  She also voice some concerns about getting her other prescriptions filled. I voiced to her to notify Christinia Gully, RN with case management on Monday to see if she can assist her with anything.  She verbalized understanding.  Patient discharged with instructions, prescriptions, and carenotes. She verbalized understanding via teach back method.  The patient left the floor with staff via w/c in stable condition.Patient discharged with instructions, prescriptions, and carenotes. She verbalized understanding via teach back method.  The patient left the floor with staff via w/c in stable condition.  Orders and H&P that included the last time the medication was given was faxed to [facility].  I spoke with the pharmacist and she stated that the patient would not receive the medication until 11/12/13 at 0800 possibly.  The patient was unable to stay for pm dose.  She was anxious to go and the patient's son would not wait.  [facility] is aware the patient is being d/c'd today.

## 2013-11-11 NOTE — Progress Notes (Signed)
Peripherally Inserted Central Catheter/Midline Placement  The IV Nurse has discussed with the patient and/or persons authorized to consent for the patient, the purpose of this procedure and the potential benefits and risks involved with this procedure.  The benefits include less needle sticks, lab draws from the catheter and patient may be discharged home with the catheter.  Risks include, but not limited to, infection, bleeding, blood clot (thrombus formation), and puncture of an artery; nerve damage and irregular heat beat.  Alternatives to this procedure were also discussed.  PICC/Midline Placement Documentation  PICC / Midline Single Lumen 34/74/25 PICC Right Basilic 41 cm 0 cm (Active)  Indication for Insertion or Continuance of Line Home intravenous therapies (PICC only) 11/11/2013 10:43 AM  Exposed Catheter (cm) 0 cm 11/11/2013 10:43 AM  Site Assessment Clean;Dry;Intact 11/11/2013 10:43 AM  Line Status Flushed;Saline locked;Blood return noted 11/11/2013 10:43 AM  Dressing Type Transparent;Securing device 11/11/2013 10:43 AM  Dressing Status Clean;Dry;Antimicrobial disc in place;Intact 11/11/2013 10:43 AM  Line Care Connections checked and tightened 11/11/2013 10:43 AM  Dressing Intervention New dressing 11/11/2013 10:43 AM  Dressing Change Due 11/18/13 11/11/2013 10:43 AM       Barnett Applebaum S Parisa Pinela 11/11/2013, 10:50 AM

## 2013-11-11 NOTE — Progress Notes (Signed)
Patient getting PICC line today. No significant change in the left fifth toe. We'll follow as needed as an outpatient.

## 2013-11-27 ENCOUNTER — Emergency Department (HOSPITAL_COMMUNITY)
Admission: EM | Admit: 2013-11-27 | Discharge: 2013-11-27 | Disposition: A | Discharge status: Home / Self Care | Payer: Medicare Other | Attending: Emergency Medicine | Admitting: Emergency Medicine

## 2013-11-27 ENCOUNTER — Encounter (HOSPITAL_COMMUNITY): Payer: Self-pay | Admitting: Emergency Medicine

## 2013-11-27 DIAGNOSIS — T82898A Other specified complication of vascular prosthetic devices, implants and grafts, initial encounter: Secondary | ICD-10-CM

## 2013-11-27 DIAGNOSIS — F17200 Nicotine dependence, unspecified, uncomplicated: Secondary | ICD-10-CM | POA: Insufficient documentation

## 2013-11-27 DIAGNOSIS — Z79899 Other long term (current) drug therapy: Secondary | ICD-10-CM | POA: Insufficient documentation

## 2013-11-27 DIAGNOSIS — Y832 Surgical operation with anastomosis, bypass or graft as the cause of abnormal reaction of the patient, or of later complication, without mention of misadventure at the time of the procedure: Secondary | ICD-10-CM | POA: Insufficient documentation

## 2013-11-27 DIAGNOSIS — T82598A Other mechanical complication of other cardiac and vascular devices and implants, initial encounter: Secondary | ICD-10-CM | POA: Insufficient documentation

## 2013-11-27 DIAGNOSIS — Z794 Long term (current) use of insulin: Secondary | ICD-10-CM | POA: Insufficient documentation

## 2013-11-27 DIAGNOSIS — Z792 Long term (current) use of antibiotics: Secondary | ICD-10-CM | POA: Insufficient documentation

## 2013-11-27 DIAGNOSIS — Z8739 Personal history of other diseases of the musculoskeletal system and connective tissue: Secondary | ICD-10-CM | POA: Insufficient documentation

## 2013-11-27 DIAGNOSIS — E119 Type 2 diabetes mellitus without complications: Secondary | ICD-10-CM | POA: Insufficient documentation

## 2013-11-27 MED ORDER — ALTEPLASE 2 MG IJ SOLR
INTRAMUSCULAR | Status: AC
  Filled 2013-11-27: qty 2

## 2013-11-27 MED ORDER — ALTEPLASE 2 MG IJ SOLR
2.0000 mg | Freq: Once | INTRAMUSCULAR | Status: AC
  Administered 2013-11-27: 2 mg
  Filled 2013-11-27: qty 2

## 2013-11-27 MED ORDER — ALTEPLASE 100 MG IV SOLR
2.0000 mg | INTRAVENOUS | Status: DC | PRN
  Filled 2013-11-27: qty 1

## 2013-11-27 NOTE — ED Notes (Signed)
No return on picc only, MD aware, ordered IV to got home so pt can do antibiotic treatment tonight, call home health care and xray picc to see if it can be used.

## 2013-11-27 NOTE — ED Notes (Signed)
Patient given discharge instruction, verbalized understand. Patient ambulatory out of the department.  

## 2013-11-27 NOTE — ED Notes (Signed)
Sterile procedure to clean and change picc dressing. Pt tolerated well, picc flushed with NS, flushes , but needs little force.

## 2013-11-27 NOTE — ED Notes (Signed)
Pt here to have picc line checked. Pt has a scant amount of dry blood at insertion site.

## 2013-11-27 NOTE — Discharge Instructions (Signed)
PICC Home Guide A peripherally inserted central catheter (PICC) is a long, thin, flexible tube that is inserted into a vein in the upper arm. It is a form of intravenous (IV) access. It is considered to be a "central" line because the tip of the PICC ends in a large vein in your chest. This large vein is called the superior vena cava (SVC). The PICC tip ends in the SVC because there is a lot of blood flow in the SVC. This allows medicines and IV fluids to be quickly distributed throughout the body. The PICC is inserted using a sterile technique by a specially trained nurse or physician. After the PICC is inserted, a chest X-ray exam is done to be sure it is in the correct place.  A PICC may be placed for different reasons, such as:  To give medicines and liquid nutrition that can only be given through a central line. Examples are:  Certain antibiotic treatments.  Chemotherapy.  Total parenteral nutrition (TPN).  To take frequent blood samples.  To give IV fluids and blood products.  If there is difficulty placing a peripheral intravenous (PIV) catheter. If taken care of properly, a PICC can remain in place for several months. A PICC can also allow a person to go home from the hospital early. Medicine and PICC care can be managed at home by a family member or home health care team. WHAT PROBLEMS CAN HAPPEN WHEN I HAVE A PICC? Problems with a PICC can occasionally occur. These may include:  A blood clot (thrombus) forming in or at the tip of the PICC. This can cause the PICC to become clogged. A clot-dissolving medicine called tissue plasminogen activator (tPA) can be given through the PICC to help break up the clot.  Inflammation of the vein (phlebitis) in which the PICC is placed. Signs of inflammation may include redness, pain at the insertion site, red streaks, or being able to feel a "cord" in the vein where the PICC is located.  Infection in the PICC or at the insertion site. Signs of  infection may include fever, chills, redness, swelling, or pus drainage from the PICC insertion site.  PICC movement (malposition). The PICC tip may move from its original position due to excessive physical activity, forceful coughing, sneezing, or vomiting.  A break or cut in the PICC. It is important to not use scissors near the PICC.  Nerve or tendon irritation or injury during PICC insertion. WHAT SHOULD I KEEP IN MIND ABOUT ACTIVITIES WHEN I HAVE A PICC?  You may bend your arm and move it freely. If your PICC is near or at the bend of your elbow, avoid activity with repeated motion at the elbow.  Rest at home for the remainder of the day following PICC line insertion.  Avoid lifting heavy objects as instructed by your health care provider.  Avoid using a crutch with the arm on the same side as your PICC. You may need to use a walker. WHAT SHOULD I KNOW ABOUT MY PICC DRESSING?  Keep your PICC bandage (dressing) clean and dry to prevent infection.  Ask your health care provider when you may shower. Ask your health care provider to teach you how to wrap the PICC when you do take a shower.  Change the PICC dressing as instructed by your health care provider.  Change your PICC dressing if it becomes loose or wet. WHAT SHOULD I KNOW ABOUT PICC CARE?  Check the PICC insertion site daily for   leakage, redness, swelling, or pain.  Do not take a bath, swim, or use hot tubs when you have a PICC. Cover PICC line with clear plastic wrap and tape to keep it dry while showering.  Flush the PICC as directed by your health care provider. Let your health care provider know right away if the PICC is difficult to flush or does not flush. Do not use force to flush the PICC.  Do not use a syringe that is less than 10 mL to flush the PICC.  Never pull or tug on the PICC.  Avoid blood pressure checks on the arm with the PICC.  Keep your PICC identification card with you at all times.  Do not  take the PICC out yourself. Only a trained clinical professional should remove the PICC. SEEK IMMEDIATE MEDICAL CARE IF:  Your PICC is accidently pulled all the way out. If this happens, cover the insertion site with a bandage or gauze dressing. Do not throw the PICC away. Your health care provider will need to inspect it.  Your PICC was tugged or pulled and has partially come out. Do not  push the PICC back in.  There is any type of drainage, redness, or swelling where the PICC enters the skin.  You cannot flush the PICC, it is difficult to flush, or the PICC leaks around the insertion site when it is flushed.  You hear a "flushing" sound when the PICC is flushed.  You have pain, discomfort, or numbness in your arm, shoulder, or jaw on the same side as the PICC.  You feel your heart "racing" or skipping beats.  You notice a hole or tear in the PICC.  You develop chills or a fever. MAKE SURE YOU:   Understand these instructions.  Will watch your condition.  Will get help right away if you are not doing well or get worse. Document Released: 12/27/2002 Document Revised: 04/12/2013 Document Reviewed: 02/27/2013 [employer] Patient Information 2014 [employer]  

## 2013-11-27 NOTE — ED Notes (Signed)
Removed long catheter extension set, flushed with Alteplase, without difficulty, saline lock, covered and will not use for one hour

## 2013-11-27 NOTE — ED Provider Notes (Signed)
CSN: [id number]     Arrival date & time 11/27/13  1618 History   First MD Initiated Contact with Patient 11/27/13 1649     Chief Complaint  Patient presents with  . Wound Check     (Consider location/radiation/quality/duration/timing/severity/associated sxs/prior Treatment) Patient is a 58 y.o. female presenting with wound check. The history is provided by the patient.  Wound Check    Past Medical History  Diagnosis Date  . Diabetes mellitus   . Arthritis   . Osteomyelitis of foot, left, acute 11/09/2013   Past Surgical History  Procedure Laterality Date  . Cesarean section    . Breast surgery      Left breast biopsy, negative  . Cholecystectomy    . Hip arthroplasty  03/16/2011    Procedure: ARTHROPLASTY BIPOLAR HIP;  Surgeon: Sanjuana Kava;  Location: [facility];  Service: Orthopedics;  Laterality: Right;   Family History  Problem Relation Age of Onset  . Diabetes Mother    History  Substance Use Topics  . Smoking status: Current Every Day Smoker -- 0.50 packs/day for 25 years    Types: Cigarettes  . Smokeless tobacco: Not on file  . Alcohol Use: No   OB History   Grav Para Term Preterm Abortions TAB SAB Ect Mult Living                 Review of Systems  All other systems reviewed and are negative.     Allergies  Morphine and related  Home Medications   Prior to Admission medications   Medication Sig Start Date End Date Taking? Authorizing Provider  ibuprofen (ADVIL,MOTRIN) 200 MG tablet Take 800 mg by mouth every 6 (six) hours as needed. pain   Yes Historical Provider, MD  insulin aspart (NOVOLOG) 100 UNIT/ML injection Inject 5 Units into the skin 3 (three) times daily before meals. 11/11/13  Yes Kathie Dike, MD  levofloxacin (LEVAQUIN) 750 MG tablet Take 1 tablet (750 mg total) by mouth daily. 11/11/13  Yes Kathie Dike, MD  metFORMIN (GLUCOPHAGE) 500 MG tablet Take 1 tablet (500 mg total) by mouth 2 (two) times daily with a meal. 11/11/13  Yes Kathie Dike, MD  oxyCODONE (OXY IR/ROXICODONE) 5 MG immediate release tablet Take 1 tablet (5 mg total) by mouth every 6 (six) hours as needed for moderate pain. 11/11/13  Yes Kathie Dike, MD  sodium chloride 0.9 % infusion Inject into the vein every 12 (twelve) hours. After administration of Vancomycin as directed 11/21/13  Yes Historical Provider, MD  vancomycin 1,250 mg in sodium chloride 0.9 % 250 mL Inject 1,250 mg into the vein every 12 (twelve) hours. Until 6/17 11/11/13  Yes Kathie Dike, MD  insulin glargine (LANTUS) 100 UNIT/ML injection Inject 0.3 mLs (30 Units total) into the skin daily. 11/11/13   Kathie Dike, MD   BP 139/65  Pulse 66  Temp(Src) 98 F (36.7 C) (Oral)  Resp 16  Ht 5' 7.5" (1.715 m)  SpO2 98% Physical Exam  Nursing note and vitals reviewed. Constitutional: She is oriented to person, place, and time. She appears well-developed and well-nourished.  HENT:  Head: Normocephalic and atraumatic.  Eyes: Conjunctivae and EOM are normal. Pupils are equal, round, and reactive to light.  Neck: Normal range of motion and phonation normal. Neck supple.  Cardiovascular: Normal rate.   PICC line right antecubital space, has dressing on. There is a minimal amount of dry blood, at the insertion site. There is no associated swelling or tenderness.  Pulmonary/Chest: Effort normal and breath sounds normal. She exhibits no tenderness.  Abdominal: Soft.  Musculoskeletal: Normal range of motion.  Neurological: She is alert and oriented to person, place, and time. She exhibits normal muscle tone.  Skin: Skin is warm and dry.  Psychiatric: She has a normal mood and affect. Her behavior is normal. Judgment and thought content normal.    ED Course  Procedures (including critical care time) Medications  alteplase (ACTIVASE) injection 2 mg (not administered)  alteplase (CATHFLO ACTIVASE) injection 2 mg (2 mg Intracatheter Given 11/27/13 1750)   Nursing administered a dose of alteplase,  into the PICC. The patient continued to have sluggish flow.  A second peripheral IV was placed, in the left arm, to use , Until the PICC line can be rejuvenated.  Patient Vitals for the past 24 hrs:  BP Temp Temp src Pulse Resp SpO2 Height  11/27/13 1843 139/65 mmHg - - 66 16 98 % -  11/27/13 1647 132/71 mmHg 98 F (36.7 C) Oral 74 18 98 % 5' 7.5" (1.715 m)    7:50 PM Reevaluation with update and discussion. After initial assessment and treatment, an updated evaluation reveals . She remains comfortable, no further complaints. All questions answered. Richarda Blade      MDM   Final diagnoses:  Occluded PICC line    PICC line malfunction, patient requires ongoing IV therapy. She is stable for discharge with outpatient management  Nursing Notes Reviewed/ Care Coordinated Applicable Imaging Reviewed Interpretation of Laboratory Data incorporated into ED treatment  The patient appears reasonably screened and/or stabilized for discharge and I doubt any other medical condition or other [facility] requiring further screening, evaluation, or treatment in the ED at this time prior to discharge.  Plan: Home Medications- usual; Home Treatments- rest; return here if the recommended treatment, does not improve the symptoms; [facility]. USE left arm PIV for infusions.   Richarda Blade, MD 11/27/13 [phone]

## 2014-05-24 ENCOUNTER — Ambulatory Visit (INDEPENDENT_AMBULATORY_CARE_PROVIDER_SITE_OTHER): Payer: Medicare Other

## 2014-05-24 ENCOUNTER — Encounter: Payer: Self-pay | Admitting: Orthopedic Surgery

## 2014-05-24 ENCOUNTER — Ambulatory Visit (INDEPENDENT_AMBULATORY_CARE_PROVIDER_SITE_OTHER): Payer: Medicare Other | Admitting: Orthopedic Surgery

## 2014-05-24 VITALS — BP 148/69 | Ht 67.5 in | Wt 184.0 lb

## 2014-05-24 DIAGNOSIS — M5137 Other intervertebral disc degeneration, lumbosacral region: Secondary | ICD-10-CM

## 2014-05-24 DIAGNOSIS — M5440 Lumbago with sciatica, unspecified side: Secondary | ICD-10-CM

## 2014-05-24 DIAGNOSIS — M25559 Pain in unspecified hip: Secondary | ICD-10-CM

## 2014-05-24 NOTE — Patient Instructions (Signed)
The patient will be referred back to her primary care physician for referral to appropriate specialist

## 2014-05-24 NOTE — Progress Notes (Signed)
Patient ID: Brandy Fisher, female   DOB: 05-24-1956, 58 y.o.   MRN: [id number] Chief Complaint  Patient presents with  . Back Pain    Low back pain and hip pain.  Dr. Anastasio Champion for a consult and treat.    History the patient is sent first for consultation regarding lower back pain status post right hip fracture and bipolar replacement. She complains of lower back pain with catching stiffness giving way which is described as 10 out of 10 stabbing and constant worse with walking. She is a smoker and does have some calf pain with ambulation  Review of systems dental problem visual problems wheezing ankle legs well and loss of bladder control frequent urination excess urination at night cold intolerance back pain stiff joints limb pain joint pain and lightheadedness all other systems were reviewed were normal  Past Medical History  Diagnosis Date  . Diabetes mellitus   . Arthritis   . Osteomyelitis of foot, left, acute 11/09/2013   Past Surgical History  Procedure Laterality Date  . Cesarean section    . Breast surgery      Left breast biopsy, negative  . Cholecystectomy    . Hip arthroplasty  03/16/2011    Procedure: ARTHROPLASTY BIPOLAR HIP;  Surgeon: Sanjuana Kava;  Location: [facility];  Service: Orthopedics;  Laterality: Right;    BP 148/69 mmHg  Ht 5' 7.5" (1.715 m)  Wt 184 lb (83.462 kg)  BMI 28.38 kg/m2 The patient is well-developed and well-nourished grooming and hygiene are acceptable. She is oriented 3 mood is flat affect is flat she is ambulatory with a cane. She has palpable tenderness in her lower back with decreased range of motion. Motor exam is normal in both lower extremities skin warm dry and intact bilaterally with a blackened small digit left foot nail from a recent trauma. Distal pulses are weak on the dorsalis pedis bilaterally sensation remains intact and knee reflexes are 1+.  Independent x-ray interpretation degenerative disc disease lumbar spine  stable prosthesis  right hip  In my opinion the patient to see a spine specialist after appropriate MRI or CT scan to evaluate her spine for spinal stenosis  She was placed on Norco 5 mg #40 which can be refilled by her primary care physician if he deems necessary.

## 2014-06-05 ENCOUNTER — Telehealth: Payer: Self-pay | Admitting: Orthopedic Surgery

## 2014-06-05 NOTE — Telephone Encounter (Signed)
Patient called, she is asking about pain medication refill (which appears to be needing to come from her primary care provider at his discretion) and also about notes from her last office visit, 05/24/14, to her primary care?  She is aware that she may pick up a copy of her last visit records, as she had already signed a release form.  Her ph# is [phone].

## 2014-06-06 NOTE — Telephone Encounter (Signed)
Advised no medication refill, was referred back to her primary care dr

## 2014-06-25 ENCOUNTER — Other Ambulatory Visit (HOSPITAL_COMMUNITY): Payer: Self-pay | Admitting: Internal Medicine

## 2014-06-25 DIAGNOSIS — M545 Low back pain, unspecified: Secondary | ICD-10-CM

## 2014-07-03 ENCOUNTER — Encounter (HOSPITAL_COMMUNITY): Payer: Self-pay

## 2014-07-03 ENCOUNTER — Ambulatory Visit (HOSPITAL_COMMUNITY)
Admission: RE | Admit: 2014-07-03 | Discharge: 2014-07-03 | Disposition: A | Discharge status: Home / Self Care | Payer: Medicare Other | Source: Ambulatory Visit | Attending: Internal Medicine | Admitting: Internal Medicine

## 2014-07-03 DIAGNOSIS — M545 Low back pain, unspecified: Secondary | ICD-10-CM

## 2014-12-20 ENCOUNTER — Other Ambulatory Visit: Payer: Self-pay | Admitting: Family Medicine

## 2014-12-20 DIAGNOSIS — M545 Low back pain, unspecified: Secondary | ICD-10-CM

## 2014-12-20 DIAGNOSIS — M48061 Spinal stenosis, lumbar region without neurogenic claudication: Secondary | ICD-10-CM

## 2014-12-25 ENCOUNTER — Other Ambulatory Visit: Payer: Medicare Other

## 2014-12-26 ENCOUNTER — Inpatient Hospital Stay: Admission: RE | Admit: 2014-12-26 | Payer: Medicare Other | Source: Ambulatory Visit

## 2015-01-23 ENCOUNTER — Ambulatory Visit
Admission: RE | Admit: 2015-01-23 | Discharge: 2015-01-23 | Disposition: A | Discharge status: Home / Self Care | Payer: Medicare Other | Source: Ambulatory Visit | Attending: Family Medicine | Admitting: Family Medicine

## 2015-01-23 DIAGNOSIS — M545 Low back pain, unspecified: Secondary | ICD-10-CM

## 2015-01-23 DIAGNOSIS — M48061 Spinal stenosis, lumbar region without neurogenic claudication: Secondary | ICD-10-CM

## 2015-01-23 MED ORDER — METHYLPREDNISOLONE ACETATE 40 MG/ML INJ SUSP (RADIOLOG
120.0000 mg | Freq: Once | INTRAMUSCULAR | Status: AC
  Administered 2015-01-23: 120 mg via EPIDURAL

## 2015-01-23 MED ORDER — IOHEXOL 180 MG/ML  SOLN
1.0000 mL | Freq: Once | INTRAMUSCULAR | Status: AC | PRN
  Administered 2015-01-23: 1 mL via EPIDURAL

## 2015-01-23 NOTE — Discharge Instructions (Signed)

## 2015-03-31 ENCOUNTER — Encounter (HOSPITAL_COMMUNITY): Payer: Self-pay | Admitting: *Deleted

## 2015-03-31 ENCOUNTER — Emergency Department (HOSPITAL_COMMUNITY)
Admission: EM | Admit: 2015-03-31 | Discharge: 2015-04-01 | Disposition: A | Discharge status: Home / Self Care | Payer: Medicare Other | Attending: Emergency Medicine | Admitting: Emergency Medicine

## 2015-03-31 DIAGNOSIS — L02415 Cutaneous abscess of right lower limb: Secondary | ICD-10-CM | POA: Diagnosis not present

## 2015-03-31 DIAGNOSIS — Z792 Long term (current) use of antibiotics: Secondary | ICD-10-CM | POA: Insufficient documentation

## 2015-03-31 DIAGNOSIS — Z79899 Other long term (current) drug therapy: Secondary | ICD-10-CM | POA: Insufficient documentation

## 2015-03-31 DIAGNOSIS — E119 Type 2 diabetes mellitus without complications: Secondary | ICD-10-CM | POA: Diagnosis not present

## 2015-03-31 DIAGNOSIS — L02414 Cutaneous abscess of left upper limb: Secondary | ICD-10-CM | POA: Diagnosis not present

## 2015-03-31 DIAGNOSIS — L02416 Cutaneous abscess of left lower limb: Secondary | ICD-10-CM | POA: Diagnosis not present

## 2015-03-31 DIAGNOSIS — Z720 Tobacco use: Secondary | ICD-10-CM | POA: Insufficient documentation

## 2015-03-31 DIAGNOSIS — M1990 Unspecified osteoarthritis, unspecified site: Secondary | ICD-10-CM | POA: Diagnosis not present

## 2015-03-31 DIAGNOSIS — L0291 Cutaneous abscess, unspecified: Secondary | ICD-10-CM

## 2015-03-31 DIAGNOSIS — Z794 Long term (current) use of insulin: Secondary | ICD-10-CM | POA: Insufficient documentation

## 2015-03-31 LAB — CBC WITH DIFFERENTIAL/PLATELET
Basophils Absolute: 0 10*3/uL (ref 0.0–0.1)
Basophils Relative: 0 %
Eosinophils Absolute: 0.1 10*3/uL (ref 0.0–0.7)
Eosinophils Relative: 1 %
HCT: 39.1 % (ref 36.0–46.0)
Hemoglobin: 13 g/dL (ref 12.0–15.0)
Lymphocytes Relative: 46 %
Lymphs Abs: 4.2 10*3/uL — ABNORMAL HIGH (ref 0.7–4.0)
MCH: 29.5 pg (ref 26.0–34.0)
MCHC: 33.2 g/dL (ref 30.0–36.0)
MCV: 88.9 fL (ref 78.0–100.0)
Monocytes Absolute: 0.4 10*3/uL (ref 0.1–1.0)
Monocytes Relative: 4 %
Neutro Abs: 4.6 10*3/uL (ref 1.7–7.7)
Neutrophils Relative %: 49 %
Platelets: 181 10*3/uL (ref 150–400)
RBC: 4.4 MIL/uL (ref 3.87–5.11)
RDW: 13.1 % (ref 11.5–15.5)
WBC: 9.3 10*3/uL (ref 4.0–10.5)

## 2015-03-31 NOTE — ED Notes (Signed)
Pt c/o multiple areas of ?abscess's that started about a week ago, pt also c/o pain with urination that radiates up to abd area,

## 2015-03-31 NOTE — ED Provider Notes (Signed)
CSN: [id number]     Arrival date & time 03/31/15  2305 History   First MD Initiated Contact with Patient 03/31/15 2350     Chief Complaint  Patient presents with  . Abscess     (Consider location/radiation/quality/duration/timing/severity/associated sxs/prior Treatment) HPI Comments: Patient is a 59 year old female who presents to the emergency department with a complaint of abscess areas of different sites. The patient states that this has been a problem for about a week. It is of note that she is an insulin requiring diabetic. The abscess areas are in the areas that she does some of the injections for her insulin. She has not had high fevers reported. She has not had nausea or vomiting, and there's been no shaking chills reported. There's no drainage from the abscess areas. She states however that they are red, swollen, and painful. She has been trying warm compresses. She's been trying squeezing them. And this one she said states that she actually stuck a sterilized needle in. None of this has helped the abscess areas.  Patient is a 59 y.o. female presenting with abscess. The history is provided by the patient.  Abscess   Past Medical History  Diagnosis Date  . Diabetes mellitus   . Arthritis   . Osteomyelitis of foot, left, acute 11/09/2013   Past Surgical History  Procedure Laterality Date  . Cesarean section    . Breast surgery      Left breast biopsy, negative  . Cholecystectomy    . Hip arthroplasty  03/16/2011    Procedure: ARTHROPLASTY BIPOLAR HIP;  Surgeon: Sanjuana Kava;  Location: [facility];  Service: Orthopedics;  Laterality: Right;   Family History  Problem Relation Age of Onset  . Diabetes Mother    Social History  Substance Use Topics  . Smoking status: Current Every Day Smoker -- 0.50 packs/day for 25 years    Types: Cigarettes  . Smokeless tobacco: None  . Alcohol Use: No   OB History    No data available     Review of Systems  Musculoskeletal: Positive for  arthralgias.  Skin: Positive for wound.  All other systems reviewed and are negative.     Allergies  Morphine and related  Home Medications   Prior to Admission medications   Medication Sig Start Date End Date Taking? Authorizing [place]  metFORMIN (GLUCOPHAGE) 500 MG tablet Take 1 tablet (500 mg total) by mouth 2 (two) times daily with a meal. 11/11/13  Yes Kathie Dike, MD  oxyCODONE (OXY IR/ROXICODONE) 5 MG immediate release tablet Take 1 tablet (5 mg total) by mouth every 6 (six) hours as needed for moderate pain. 11/11/13  Yes Kathie Dike, MD  ibuprofen (ADVIL,MOTRIN) 200 MG tablet Take 800 mg by mouth every 6 (six) hours as needed. pain    Historical Yuleimy Kretz, MD  insulin aspart (NOVOLOG) 100 UNIT/ML injection Inject 5 Units into the skin 3 (three) times daily before meals. 11/11/13   Kathie Dike, MD  insulin glargine (LANTUS) 100 UNIT/ML injection Inject 0.3 mLs (30 Units total) into the skin daily. 11/11/13   Kathie Dike, MD  levofloxacin (LEVAQUIN) 750 MG tablet Take 1 tablet (750 mg total) by mouth daily. 11/11/13   Kathie Dike, MD  sodium chloride 0.9 % infusion Inject into the vein every 12 (twelve) hours. After administration of Vancomycin as directed 11/21/13   Historical Candance Bohlman, MD  vancomycin 1,250 mg in sodium chloride 0.9 % 250 mL Inject 1,250 mg into the vein every 12 (twelve) hours. Until  6/17 11/11/13   Kathie Dike, MD   BP 167/69 mmHg  Pulse 99  Temp(Src) 98.3 F (36.8 C) (Oral)  Resp 20  Ht 5' 7.5" (1.715 m)  Wt 180 lb (81.647 kg)  BMI 27.76 kg/m2  SpO2 100% Physical Exam  Constitutional: She is oriented to person, place, and time. She appears well-developed and well-nourished.  Non-toxic appearance.  HENT:  Head: Normocephalic.  Right Ear: Tympanic membrane and external ear normal.  Left Ear: Tympanic membrane and external ear normal.  Eyes: EOM and lids are normal. Pupils are equal, round, and reactive to light.  Neck: Normal range of motion.  Neck supple. Carotid bruit is not present.  Cardiovascular: Normal rate, regular rhythm, normal heart sounds, intact distal pulses and normal pulses.   Pulmonary/Chest: Breath sounds normal. No respiratory distress. She has no rhonchi.  Abdominal: Soft. Bowel sounds are normal. There is no tenderness. There is no guarding and no CVA tenderness.  Musculoskeletal: Normal range of motion.  Lymphadenopathy:       Head (right side): No submandibular adenopathy present.       Head (left side): No submandibular adenopathy present.    She has no cervical adenopathy.  Neurological: She is alert and oriented to person, place, and time. She has normal strength. No cranial nerve deficit or sensory deficit.  Skin: Skin is warm and dry.  The patient has a quarter size abscess area of the anterior right thigh. There is an abscess area of the left anterior thigh that is slightly larger diameter quarter. There is an abscess area of the left bicep tricep area. None of these have red streaks. There is no drainage from any of them. They are tender to palpation.  Psychiatric: She has a normal mood and affect. Her speech is normal.  Nursing note and vitals reviewed.   ED Course  Procedures (including critical care time) Labs Review Labs Reviewed  CBC WITH DIFFERENTIAL/PLATELET - Abnormal; Notable for the following:    Lymphs Abs 4.2 (*)    All other components within normal limits  URINALYSIS, ROUTINE W REFLEX MICROSCOPIC (NOT AT [facility])  BASIC METABOLIC PANEL    Imaging Review No results found. I have personally reviewed and evaluated these images and lab results as part of my medical decision-making.   EKG Interpretation None      MDM  Patient has multiple abscess areas. I discussed with the patient to discontinue the squeezing. And to certainly discontinue sticking anything in these abscess areas. Patient knowledge is understanding of my discharge instructions. The patient was given intramuscular  Rocephin tonight. She is given a prescription for clindamycin.  The complete blood count shows a white blood cell count of 9300. Doubt any major systemic problem. Basic metabolic panel shows the BUN to be elevated at 21, the glucose is elevated at 183, the anion gap is 9. The urinalysis shows a clear yellow specimen with a specific gravity 1.025, there is a small leukocyte esterase present with 7-10 white blood cells present. A culture has been sent to the lab.    Final diagnoses:  None    **I have reviewed nursing notes, vital signs, and all appropriate lab and imaging results for this patient.Lily Kocher, PA-C 24/23/53 [id number]  Delora Fuel, MD 31/54/00 8676

## 2015-04-01 DIAGNOSIS — L02415 Cutaneous abscess of right lower limb: Secondary | ICD-10-CM | POA: Diagnosis not present

## 2015-04-01 LAB — URINALYSIS, ROUTINE W REFLEX MICROSCOPIC
Bilirubin Urine: NEGATIVE
Glucose, UA: NEGATIVE mg/dL
Hgb urine dipstick: NEGATIVE
Nitrite: NEGATIVE
Protein, ur: NEGATIVE mg/dL
Specific Gravity, Urine: 1.025 (ref 1.005–1.030)
Urobilinogen, UA: 1 mg/dL (ref 0.0–1.0)
pH: 6 (ref 5.0–8.0)

## 2015-04-01 LAB — URINE MICROSCOPIC-ADD ON

## 2015-04-01 LAB — BASIC METABOLIC PANEL
Anion gap: 9 (ref 5–15)
BUN: 21 mg/dL — ABNORMAL HIGH (ref 6–20)
CO2: 27 mmol/L (ref 22–32)
Calcium: 8.4 mg/dL — ABNORMAL LOW (ref 8.9–10.3)
Chloride: 103 mmol/L (ref 101–111)
Creatinine, Ser: 0.73 mg/dL (ref 0.44–1.00)
GFR calc Af Amer: >60 mL/min (ref >60)
GFR calc non Af Amer: >60 mL/min (ref >60)
Glucose, Bld: 183 mg/dL — ABNORMAL HIGH (ref 65–99)
Potassium: 3.8 mmol/L (ref 3.5–5.1)
Sodium: 139 mmol/L (ref 135–145)

## 2015-04-01 MED ORDER — LIDOCAINE HCL (PF) 1 % IJ SOLN
INTRAMUSCULAR | Status: AC
  Administered 2015-04-01: 2 mL
  Filled 2015-04-01: qty 5

## 2015-04-01 MED ORDER — CEFTRIAXONE SODIUM 1 G IJ SOLR
1.0000 g | Freq: Once | INTRAMUSCULAR | Status: AC
  Administered 2015-04-01: 1 g via INTRAMUSCULAR
  Filled 2015-04-01: qty 10

## 2015-04-01 MED ORDER — CLINDAMYCIN HCL 150 MG PO CAPS: 150.0000 mg | ORAL_CAPSULE | Freq: Four times a day (QID) | ORAL | Status: DC

## 2015-04-01 NOTE — ED Notes (Signed)
Discharge instructions given, pt demonstrated teach back and verbal understanding. No concerns voiced.  

## 2015-04-01 NOTE — Discharge Instructions (Signed)
Please soak the abscess area and warm Epsom salt bath daily for 10-15 minutes. If not improving, please see Dr Anastasio Champion, or return to the Emergency Dept. Use Clindamycin daily with food until all taken. Abscess An abscess (boil or furuncle) is an infected area on or under the skin. This area is filled with yellowish-white fluid (pus) and other material (debris). HOME CARE   Only take medicines as told by your doctor.  If you were given antibiotic medicine, take it as directed. Finish the medicine even if you start to feel better.  If gauze is used, follow your doctor's directions for changing the gauze.  To avoid spreading the infection:  Keep your abscess covered with a bandage.  Wash your hands well.  Do not share personal care items, towels, or whirlpools with others.  Avoid skin contact with others.  Keep your skin and clothes clean around the abscess.  Keep all doctor visits as told. GET HELP RIGHT AWAY IF:   You have more pain, puffiness (swelling), or redness in the wound site.  You have more fluid or blood coming from the wound site.  You have muscle aches, chills, or you feel sick.  You have a fever. MAKE SURE YOU:   Understand these instructions.  Will watch your condition.  Will get help right away if you are not doing well or get worse. Document Released: 12/09/2007 Document Revised: 12/22/2011 Document Reviewed: 09/04/2011 [facility] Patient Information 2015 [place], [place]. This information is not intended to replace advice given to you by your health care provider. Make sure you discuss any questions you have with your health care provider.

## 2015-04-30 ENCOUNTER — Other Ambulatory Visit (HOSPITAL_COMMUNITY): Payer: Self-pay | Admitting: Neurological Surgery

## 2015-05-09 ENCOUNTER — Encounter (HOSPITAL_COMMUNITY): Payer: Self-pay

## 2015-05-09 ENCOUNTER — Ambulatory Visit (HOSPITAL_COMMUNITY)
Admission: RE | Admit: 2015-05-09 | Discharge: 2015-05-09 | Disposition: A | Discharge status: Home / Self Care | Payer: Medicare Other | Source: Ambulatory Visit | Attending: Neurological Surgery | Admitting: Neurological Surgery

## 2015-05-09 ENCOUNTER — Encounter (HOSPITAL_COMMUNITY)
Admission: RE | Admit: 2015-05-09 | Discharge: 2015-05-09 | Disposition: A | Discharge status: Home / Self Care | Payer: Medicare Other | Source: Ambulatory Visit | Attending: Neurological Surgery | Admitting: Neurological Surgery

## 2015-05-09 DIAGNOSIS — M868X7 Other osteomyelitis, ankle and foot: Secondary | ICD-10-CM | POA: Diagnosis not present

## 2015-05-09 DIAGNOSIS — Z01818 Encounter for other preprocedural examination: Secondary | ICD-10-CM | POA: Diagnosis present

## 2015-05-09 DIAGNOSIS — M47894 Other spondylosis, thoracic region: Secondary | ICD-10-CM | POA: Diagnosis not present

## 2015-05-09 DIAGNOSIS — R9431 Abnormal electrocardiogram [ECG] [EKG]: Secondary | ICD-10-CM | POA: Insufficient documentation

## 2015-05-09 DIAGNOSIS — F17200 Nicotine dependence, unspecified, uncomplicated: Secondary | ICD-10-CM | POA: Diagnosis not present

## 2015-05-09 DIAGNOSIS — M7130 Other bursal cyst, unspecified site: Secondary | ICD-10-CM

## 2015-05-09 DIAGNOSIS — E119 Type 2 diabetes mellitus without complications: Secondary | ICD-10-CM | POA: Insufficient documentation

## 2015-05-09 DIAGNOSIS — M4184 Other forms of scoliosis, thoracic region: Secondary | ICD-10-CM | POA: Insufficient documentation

## 2015-05-09 DIAGNOSIS — Z01810 Encounter for preprocedural cardiovascular examination: Secondary | ICD-10-CM | POA: Diagnosis not present

## 2015-05-09 DIAGNOSIS — M4800 Spinal stenosis, site unspecified: Secondary | ICD-10-CM | POA: Diagnosis not present

## 2015-05-09 DIAGNOSIS — M48061 Spinal stenosis, lumbar region without neurogenic claudication: Secondary | ICD-10-CM

## 2015-05-09 DIAGNOSIS — M1990 Unspecified osteoarthritis, unspecified site: Secondary | ICD-10-CM | POA: Insufficient documentation

## 2015-05-09 DIAGNOSIS — Z01812 Encounter for preprocedural laboratory examination: Secondary | ICD-10-CM | POA: Diagnosis not present

## 2015-05-09 HISTORY — DX: Cough, unspecified: R05.9

## 2015-05-09 HISTORY — DX: Cough: R05

## 2015-05-09 LAB — BASIC METABOLIC PANEL
Anion gap: 6 (ref 5–15)
BUN: 14 mg/dL (ref 6–20)
CO2: 29 mmol/L (ref 22–32)
Calcium: 8.9 mg/dL (ref 8.9–10.3)
Chloride: 103 mmol/L (ref 101–111)
Creatinine, Ser: 0.67 mg/dL (ref 0.44–1.00)
GFR calc Af Amer: >60 mL/min (ref >60)
GFR calc non Af Amer: >60 mL/min (ref >60)
Glucose, Bld: 145 mg/dL — ABNORMAL HIGH (ref 65–99)
Potassium: 4.5 mmol/L (ref 3.5–5.1)
Sodium: 138 mmol/L (ref 135–145)

## 2015-05-09 LAB — CBC WITH DIFFERENTIAL/PLATELET
Basophils Absolute: 0 10*3/uL (ref 0.0–0.1)
Basophils Relative: 0 %
Eosinophils Absolute: 0.1 10*3/uL (ref 0.0–0.7)
Eosinophils Relative: 1 %
HCT: 38.8 % (ref 36.0–46.0)
Hemoglobin: 12.7 g/dL (ref 12.0–15.0)
Lymphocytes Relative: 40 %
Lymphs Abs: 3.5 10*3/uL (ref 0.7–4.0)
MCH: 29 pg (ref 26.0–34.0)
MCHC: 32.7 g/dL (ref 30.0–36.0)
MCV: 88.6 fL (ref 78.0–100.0)
Monocytes Absolute: 0.5 10*3/uL (ref 0.1–1.0)
Monocytes Relative: 6 %
Neutro Abs: 4.7 10*3/uL (ref 1.7–7.7)
Neutrophils Relative %: 53 %
Platelets: 172 10*3/uL (ref 150–400)
RBC: 4.38 MIL/uL (ref 3.87–5.11)
RDW: 13.2 % (ref 11.5–15.5)
WBC: 8.9 10*3/uL (ref 4.0–10.5)

## 2015-05-09 LAB — SURGICAL PCR SCREEN
MRSA, PCR: NEGATIVE
Staphylococcus aureus: NEGATIVE

## 2015-05-09 LAB — GLUCOSE, CAPILLARY: Glucose-Capillary: 167 mg/dL — ABNORMAL HIGH (ref 65–99)

## 2015-05-09 LAB — PROTIME-INR
INR: 0.98 (ref 0.00–1.49)
Prothrombin Time: 13.2 seconds (ref 11.6–15.2)

## 2015-05-09 NOTE — Pre-Procedure Instructions (Signed)
Geraldean Walen Izzo  05/09/2015      [facility] - [place], [place] - [id number] [place] [address] Phone: [phone] Fax: [phone]  [facility] - [place], [address] [place] [address] Phone: [phone] Fax: [phone]    Your procedure is scheduled on     Thursday  05/16/15  Report to [facility] Admitting at 830 A.M.  Call this number if you have problems the morning of surgery:  [phone]   Remember:  Do not eat food or drink liquids after midnight.  Take these medicines the morning of surgery with A SIP OF WATER   OXYCODONE IF NEEDED (STOP IBUPROFEN/ ADVIL/ MOTRIN, NO GOODYS, BC POWDERS, NO VITAMINS OR HERBAL MEDICINES) How to Manage Your Diabetes Before Surgery   Why is it important to control my blood sugar before and after surgery?   Improving blood sugar levels before and after surgery helps healing and can limit problems.  A way of improving blood sugar control is eating a healthy diet by:  - Eating less sugar and carbohydrates  - Increasing activity/exercise  - Talk with your doctor about reaching your blood sugar goals  High blood sugars (greater than 180 mg/dL) can raise your risk of infections and slow down your recovery so you will need to focus on controlling your diabetes during the weeks before surgery.  Make sure that the doctor who takes care of your diabetes knows about your planned surgery including the date and location.  How do I manage my blood sugars before surgery?   Check your blood sugar at least 4 times a day, 2 days before surgery to make sure that they are not too high or low.   Check your blood sugar the morning of your surgery when you wake up and every 2               hours until you get to the Short-Stay unit.  If your blood sugar is less than 70 mg/dL, you will need to treat for low  blood sugar by:  Treat a low blood sugar (less than 70 mg/dL) with 1/2 cup of clear juice (cranberry or apple), 4 glucose tablets, OR glucose gel.  Recheck blood sugar in 15 minutes after treatment (to make sure it is greater than 70 mg/dL).  If blood sugar is not greater than 70 mg/dL on re-check, call [phone] for further instructions.   Report your blood sugar to the Short-Stay nurse when you get to Short-Stay.  References:  [facility], 2007 "How to Manage your Diabetes Before and After Surgery".  What do I do about my diabetes medications?   Do not take oral diabetes medicines (pills) the morning of surgery.  THE NIGHT BEFORE SURGERY, take 5 units of  NOVOLOG Insulin  WITH MEALS     THE MORNING OF SURGERY  NO Insulin OR METFORMIN        For patients with "Insulin Pumps":  Contact your diabetes doctor for specific instructions before surgery.   Decrease basal insulin rates by 20% at midnight the night before surgery.  Note that if your surgery is planned to be longer than 2 hours, your insulin pump will be removed and intravenous (IV) insulin will be started and managed by the nurses and anesthesiologist.  You will be able to  restart your insulin pump once you are awake and able to manage it.  Make sure to bring insulin pump supplies to the hospital with you in case your site needs to be changed.        Do not wear jewelry, make-up or nail polish.  Do not wear lotions, powders, or perfumes.  You may wear deodorant.  Do not shave 48 hours prior to surgery.  Men may shave face and neck.  Do not bring valuables to the hospital.  [facility] is not responsible for any belongings or valuables.  Contacts, dentures or bridgework may not be worn into surgery.  Leave your suitcase in the car.  After surgery it may be brought to your room.  For patients admitted to the hospital, discharge time will be determined by your treatment  team.  Patients discharged the day of surgery will not be allowed to drive home.   Name and phone number of your driver:    Special instructions: SEE PREPARING FOR  SURGERY   Please read over the following fact sheets that you were given. Pain Booklet, Coughing and Deep Breathing, MRSA Information and Surgical Site Infection Prevention

## 2015-05-09 NOTE — Progress Notes (Signed)
PATIENT STATES SHE HAS BOILS ON BOTH LEGS, ANTIBIOTIC OINTMENT BEING USED NOW.  FINISHED TAKING ANTIBIOTIC CLINDAMYCIN AND LEVAQUIN.

## 2015-05-10 LAB — HEMOGLOBIN A1C
Hgb A1c MFr Bld: 7 % — ABNORMAL HIGH (ref 4.8–5.6)
Mean Plasma Glucose: 154 mg/dL

## 2015-05-15 MED ORDER — CEFAZOLIN SODIUM-DEXTROSE 2-3 GM-% IV SOLR
2.0000 g | INTRAVENOUS | Status: AC
  Administered 2015-05-16: 2 g via INTRAVENOUS
  Filled 2015-05-15: qty 50

## 2015-05-15 MED ORDER — DEXAMETHASONE SODIUM PHOSPHATE 10 MG/ML IJ SOLN
10.0000 mg | INTRAMUSCULAR | Status: AC
  Administered 2015-05-16: 10 mg via INTRAVENOUS
  Filled 2015-05-15: qty 1

## 2015-05-16 ENCOUNTER — Inpatient Hospital Stay (HOSPITAL_COMMUNITY)
Admission: RE | Admit: 2015-05-16 | Discharge: 2015-05-17 | DRG: 520 | Disposition: A | Discharge status: Home / Self Care | Payer: Medicare Other | Source: Ambulatory Visit | Attending: Neurological Surgery | Admitting: Neurological Surgery

## 2015-05-16 ENCOUNTER — Encounter (HOSPITAL_COMMUNITY): Payer: Self-pay | Admitting: *Deleted

## 2015-05-16 ENCOUNTER — Inpatient Hospital Stay (HOSPITAL_COMMUNITY): Payer: Medicare Other | Admitting: Anesthesiology

## 2015-05-16 ENCOUNTER — Encounter (HOSPITAL_COMMUNITY)
Admission: RE | Disposition: A | Discharge status: Home / Self Care | Payer: Self-pay | Source: Ambulatory Visit | Attending: Neurological Surgery

## 2015-05-16 ENCOUNTER — Inpatient Hospital Stay (HOSPITAL_COMMUNITY): Payer: Medicare Other

## 2015-05-16 DIAGNOSIS — Z96641 Presence of right artificial hip joint: Secondary | ICD-10-CM | POA: Diagnosis present

## 2015-05-16 DIAGNOSIS — M4806 Spinal stenosis, lumbar region: Secondary | ICD-10-CM | POA: Diagnosis present

## 2015-05-16 DIAGNOSIS — Z794 Long term (current) use of insulin: Secondary | ICD-10-CM

## 2015-05-16 DIAGNOSIS — F17210 Nicotine dependence, cigarettes, uncomplicated: Secondary | ICD-10-CM | POA: Diagnosis not present

## 2015-05-16 DIAGNOSIS — M7138 Other bursal cyst, other site: Secondary | ICD-10-CM | POA: Diagnosis present

## 2015-05-16 DIAGNOSIS — Z833 Family history of diabetes mellitus: Secondary | ICD-10-CM

## 2015-05-16 DIAGNOSIS — E119 Type 2 diabetes mellitus without complications: Secondary | ICD-10-CM | POA: Diagnosis present

## 2015-05-16 DIAGNOSIS — Z9889 Other specified postprocedural states: Secondary | ICD-10-CM

## 2015-05-16 DIAGNOSIS — M549 Dorsalgia, unspecified: Secondary | ICD-10-CM

## 2015-05-16 HISTORY — PX: LUMBAR LAMINECTOMY/DECOMPRESSION MICRODISCECTOMY: SHX5026

## 2015-05-16 LAB — GLUCOSE, CAPILLARY
Glucose-Capillary: 128 mg/dL — ABNORMAL HIGH (ref 65–99)
Glucose-Capillary: 134 mg/dL — ABNORMAL HIGH (ref 65–99)
Glucose-Capillary: 133 mg/dL — ABNORMAL HIGH (ref 65–99)
Glucose-Capillary: 199 mg/dL — ABNORMAL HIGH (ref 65–99)
Glucose-Capillary: 235 mg/dL — ABNORMAL HIGH (ref 65–99)

## 2015-05-16 SURGERY — LUMBAR LAMINECTOMY/DECOMPRESSION MICRODISCECTOMY 1 LEVEL
Anesthesia: General | Site: Back | Laterality: Left

## 2015-05-16 MED ORDER — SODIUM CHLORIDE 0.9 % IJ SOLN
INTRAMUSCULAR | Status: AC
  Filled 2015-05-16: qty 10

## 2015-05-16 MED ORDER — SENNA 8.6 MG PO TABS
1.0000 | ORAL_TABLET | Freq: Two times a day (BID) | ORAL | Status: DC
  Administered 2015-05-16 – 2015-05-17 (×2): 8.6 mg via ORAL
  Filled 2015-05-16 (×2): qty 1

## 2015-05-16 MED ORDER — ONDANSETRON HCL 4 MG/2ML IJ SOLN
INTRAMUSCULAR | Status: DC | PRN
  Administered 2015-05-16: 4 mg via INTRAVENOUS

## 2015-05-16 MED ORDER — METFORMIN HCL 500 MG PO TABS
1000.0000 mg | ORAL_TABLET | Freq: Two times a day (BID) | ORAL | Status: DC
  Administered 2015-05-16 – 2015-05-17 (×2): 1000 mg via ORAL
  Filled 2015-05-16 (×2): qty 2

## 2015-05-16 MED ORDER — SODIUM CHLORIDE 0.9 % IV SOLN: 250.0000 mL | INTRAVENOUS | Status: DC

## 2015-05-16 MED ORDER — PROPOFOL 10 MG/ML IV BOLUS
INTRAVENOUS | Status: DC | PRN
  Administered 2015-05-16: 110 mg via INTRAVENOUS

## 2015-05-16 MED ORDER — THROMBIN 5000 UNITS EX SOLR
CUTANEOUS | Status: DC | PRN
  Administered 2015-05-16 (×2): 5000 [IU] via TOPICAL

## 2015-05-16 MED ORDER — INSULIN ASPART 100 UNIT/ML ~~LOC~~ SOLN
0.0000 [IU] | Freq: Three times a day (TID) | SUBCUTANEOUS | Status: DC
  Administered 2015-05-17: 3 [IU] via SUBCUTANEOUS

## 2015-05-16 MED ORDER — ROCURONIUM BROMIDE 50 MG/5ML IV SOLN
INTRAVENOUS | Status: AC
  Filled 2015-05-16: qty 1

## 2015-05-16 MED ORDER — CEFAZOLIN SODIUM 1-5 GM-% IV SOLN
1.0000 g | Freq: Three times a day (TID) | INTRAVENOUS | Status: AC
  Administered 2015-05-16 – 2015-05-17 (×2): 1 g via INTRAVENOUS
  Filled 2015-05-16 (×2): qty 50

## 2015-05-16 MED ORDER — POTASSIUM CHLORIDE IN NACL 20-0.9 MEQ/L-% IV SOLN
INTRAVENOUS | Status: DC
  Filled 2015-05-16 (×3): qty 1000

## 2015-05-16 MED ORDER — LIDOCAINE HCL (CARDIAC) 20 MG/ML IV SOLN
INTRAVENOUS | Status: AC
  Filled 2015-05-16: qty 5

## 2015-05-16 MED ORDER — SUGAMMADEX SODIUM 200 MG/2ML IV SOLN
INTRAVENOUS | Status: DC | PRN
  Administered 2015-05-16: 200 mg via INTRAVENOUS

## 2015-05-16 MED ORDER — SODIUM CHLORIDE 0.9 % IJ SOLN: 3.0000 mL | INTRAMUSCULAR | Status: DC | PRN

## 2015-05-16 MED ORDER — INSULIN ASPART 100 UNIT/ML ~~LOC~~ SOLN
0.0000 [IU] | Freq: Every day | SUBCUTANEOUS | Status: DC
  Administered 2015-05-16: 2 [IU] via SUBCUTANEOUS

## 2015-05-16 MED ORDER — METHOCARBAMOL 1000 MG/10ML IJ SOLN
500.0000 mg | Freq: Four times a day (QID) | INTRAVENOUS | Status: DC | PRN
  Filled 2015-05-16: qty 5

## 2015-05-16 MED ORDER — HYDROCODONE-ACETAMINOPHEN 10-325 MG PO TABS
1.0000 | ORAL_TABLET | ORAL | Status: DC | PRN
  Administered 2015-05-16 – 2015-05-17 (×3): 1 via ORAL
  Filled 2015-05-16 (×3): qty 1

## 2015-05-16 MED ORDER — 0.9 % SODIUM CHLORIDE (POUR BTL) OPTIME
TOPICAL | Status: DC | PRN
  Administered 2015-05-16: 1000 mL

## 2015-05-16 MED ORDER — HYDROCODONE-ACETAMINOPHEN 10-325 MG PO TABS
1.0000 | ORAL_TABLET | Freq: Four times a day (QID) | ORAL | Status: DC | PRN
  Administered 2015-05-16: 1 via ORAL
  Filled 2015-05-16: qty 1

## 2015-05-16 MED ORDER — PHENYLEPHRINE HCL 10 MG/ML IJ SOLN
10.0000 mg | INTRAVENOUS | Status: DC | PRN
  Administered 2015-05-16: 10 ug/min via INTRAVENOUS

## 2015-05-16 MED ORDER — MENTHOL 3 MG MT LOZG: 1.0000 | LOZENGE | OROMUCOSAL | Status: DC | PRN

## 2015-05-16 MED ORDER — SODIUM CHLORIDE 0.9 % IJ SOLN
3.0000 mL | Freq: Two times a day (BID) | INTRAMUSCULAR | Status: DC
  Administered 2015-05-16: 3 mL via INTRAVENOUS

## 2015-05-16 MED ORDER — PROPOFOL 10 MG/ML IV BOLUS
INTRAVENOUS | Status: AC
  Filled 2015-05-16: qty 20

## 2015-05-16 MED ORDER — THROMBIN 5000 UNITS EX SOLR
OROMUCOSAL | Status: DC | PRN
  Administered 2015-05-16: 10 mL via TOPICAL

## 2015-05-16 MED ORDER — PHENOL 1.4 % MT LIQD: 1.0000 | OROMUCOSAL | Status: DC | PRN

## 2015-05-16 MED ORDER — HEMOSTATIC AGENTS (NO CHARGE) OPTIME
TOPICAL | Status: DC | PRN
  Administered 2015-05-16: 1 via TOPICAL

## 2015-05-16 MED ORDER — METHOCARBAMOL 500 MG PO TABS
500.0000 mg | ORAL_TABLET | Freq: Four times a day (QID) | ORAL | Status: DC | PRN
  Administered 2015-05-16: 500 mg via ORAL
  Filled 2015-05-16: qty 1

## 2015-05-16 MED ORDER — SODIUM CHLORIDE 0.9 % IR SOLN
Status: DC | PRN
  Administered 2015-05-16: 500 mL

## 2015-05-16 MED ORDER — SUCCINYLCHOLINE CHLORIDE 20 MG/ML IJ SOLN
INTRAMUSCULAR | Status: AC
  Filled 2015-05-16: qty 1

## 2015-05-16 MED ORDER — LACTATED RINGERS IV SOLN
INTRAVENOUS | Status: DC
  Administered 2015-05-16 (×2): via INTRAVENOUS

## 2015-05-16 MED ORDER — EPHEDRINE SULFATE 50 MG/ML IJ SOLN
INTRAMUSCULAR | Status: AC
  Filled 2015-05-16: qty 1

## 2015-05-16 MED ORDER — ONDANSETRON HCL 4 MG/2ML IJ SOLN: 4.0000 mg | INTRAMUSCULAR | Status: DC | PRN

## 2015-05-16 MED ORDER — PROMETHAZINE HCL 25 MG/ML IJ SOLN: 6.2500 mg | INTRAMUSCULAR | Status: DC | PRN

## 2015-05-16 MED ORDER — LIDOCAINE HCL 4 % MT SOLN
OROMUCOSAL | Status: DC | PRN
  Administered 2015-05-16: 4 mL via TOPICAL

## 2015-05-16 MED ORDER — MIDAZOLAM HCL 5 MG/5ML IJ SOLN
INTRAMUSCULAR | Status: DC | PRN
  Administered 2015-05-16: 2 mg via INTRAVENOUS

## 2015-05-16 MED ORDER — ACETAMINOPHEN 650 MG RE SUPP: 650.0000 mg | RECTAL | Status: DC | PRN

## 2015-05-16 MED ORDER — ACETAMINOPHEN 325 MG PO TABS: 650.0000 mg | ORAL_TABLET | ORAL | Status: DC | PRN

## 2015-05-16 MED ORDER — IBUPROFEN 200 MG PO TABS: 800.0000 mg | ORAL_TABLET | Freq: Four times a day (QID) | ORAL | Status: DC | PRN

## 2015-05-16 MED ORDER — IPRATROPIUM-ALBUTEROL 20-100 MCG/ACT IN AERS
INHALATION_SPRAY | RESPIRATORY_TRACT | Status: DC | PRN
  Administered 2015-05-16: 5 via RESPIRATORY_TRACT

## 2015-05-16 MED ORDER — SUFENTANIL CITRATE 50 MCG/ML IV SOLN
INTRAVENOUS | Status: AC
Start: 2015-05-16 — End: 2015-05-16
  Filled 2015-05-16: qty 1

## 2015-05-16 MED ORDER — ROCURONIUM BROMIDE 100 MG/10ML IV SOLN
INTRAVENOUS | Status: DC | PRN
  Administered 2015-05-16: 50 mg via INTRAVENOUS

## 2015-05-16 MED ORDER — HYDROMORPHONE HCL 1 MG/ML IJ SOLN
0.2500 mg | INTRAMUSCULAR | Status: DC | PRN
  Administered 2015-05-16 (×2): 0.5 mg via INTRAVENOUS

## 2015-05-16 MED ORDER — SUGAMMADEX SODIUM 200 MG/2ML IV SOLN
INTRAVENOUS | Status: AC
  Filled 2015-05-16: qty 2

## 2015-05-16 MED ORDER — MIDAZOLAM HCL 2 MG/2ML IJ SOLN
INTRAMUSCULAR | Status: AC
  Filled 2015-05-16: qty 4

## 2015-05-16 MED ORDER — HYDROMORPHONE HCL 1 MG/ML IJ SOLN: 0.5000 mg | INTRAMUSCULAR | Status: DC | PRN

## 2015-05-16 MED ORDER — HYDROMORPHONE HCL 1 MG/ML IJ SOLN
INTRAMUSCULAR | Status: AC
  Filled 2015-05-16: qty 1

## 2015-05-16 MED ORDER — SUFENTANIL CITRATE 50 MCG/ML IV SOLN
INTRAVENOUS | Status: DC | PRN
  Administered 2015-05-16 (×2): 5 ug via INTRAVENOUS

## 2015-05-16 MED ORDER — ALBUTEROL SULFATE HFA 108 (90 BASE) MCG/ACT IN AERS
INHALATION_SPRAY | RESPIRATORY_TRACT | Status: AC
  Filled 2015-05-16: qty 6.7

## 2015-05-16 MED ORDER — GLYCOPYRROLATE 0.2 MG/ML IJ SOLN
INTRAMUSCULAR | Status: AC
  Filled 2015-05-16: qty 1

## 2015-05-16 SURGICAL SUPPLY — 41 items
APL SKNCLS STERI-STRIP NONHPOA (GAUZE/BANDAGES/DRESSINGS) ×1
BAG DECANTER FOR FLEXI CONT (MISCELLANEOUS) ×2 IMPLANT
BENZOIN TINCTURE PRP APPL 2/3 (GAUZE/BANDAGES/DRESSINGS) ×2 IMPLANT
BUR MATCHSTICK NEURO 3.0 LAGG (BURR) ×2 IMPLANT
CANISTER SUCT 3000ML PPV (MISCELLANEOUS) ×2 IMPLANT
DRAPE LAPAROTOMY 100X72X124 (DRAPES) ×2 IMPLANT
DRAPE MICROSCOPE LEICA (MISCELLANEOUS) ×2 IMPLANT
DRAPE POUCH INSTRU U-SHP 10X18 (DRAPES) ×2 IMPLANT
DRAPE SURG 17X23 STRL (DRAPES) ×2 IMPLANT
DRSG OPSITE POSTOP 3X4 (GAUZE/BANDAGES/DRESSINGS) ×1 IMPLANT
DURAPREP 26ML APPLICATOR (WOUND CARE) ×2 IMPLANT
ELECT REM PT RETURN 9FT ADLT (ELECTROSURGICAL) ×2
ELECTRODE REM PT RTRN 9FT ADLT (ELECTROSURGICAL) ×1 IMPLANT
GAUZE SPONGE 4X4 16PLY XRAY LF (GAUZE/BANDAGES/DRESSINGS) IMPLANT
GLOVE BIO SURGEON STRL SZ8 (GLOVE) ×2 IMPLANT
GOWN STRL REUS W/ TWL LRG LVL3 (GOWN DISPOSABLE) IMPLANT
GOWN STRL REUS W/ TWL XL LVL3 (GOWN DISPOSABLE) ×1 IMPLANT
GOWN STRL REUS W/TWL 2XL LVL3 (GOWN DISPOSABLE) IMPLANT
GOWN STRL REUS W/TWL LRG LVL3 (GOWN DISPOSABLE)
GOWN STRL REUS W/TWL XL LVL3 (GOWN DISPOSABLE) ×2
HEMOSTAT POWDER KIT SURGIFOAM (HEMOSTASIS) IMPLANT
KIT BASIN OR (CUSTOM PROCEDURE TRAY) ×2 IMPLANT
KIT ROOM TURNOVER OR (KITS) ×2 IMPLANT
NDL HYPO 25X1 1.5 SAFETY (NEEDLE) ×1 IMPLANT
NDL SPNL 20GX3.5 QUINCKE YW (NEEDLE) IMPLANT
NEEDLE HYPO 25X1 1.5 SAFETY (NEEDLE) ×2 IMPLANT
NEEDLE SPNL 20GX3.5 QUINCKE YW (NEEDLE) IMPLANT
NS IRRIG 1000ML POUR BTL (IV SOLUTION) ×2 IMPLANT
PACK LAMINECTOMY NEURO (CUSTOM PROCEDURE TRAY) ×2 IMPLANT
PAD ARMBOARD 7.5X6 YLW CONV (MISCELLANEOUS) ×6 IMPLANT
RUBBERBAND STERILE (MISCELLANEOUS) ×4 IMPLANT
SPONGE SURGIFOAM ABS GEL SZ50 (HEMOSTASIS) ×2 IMPLANT
STRIP CLOSURE SKIN 1/2X4 (GAUZE/BANDAGES/DRESSINGS) ×2 IMPLANT
SUT VIC AB 0 CT1 18XCR BRD8 (SUTURE) ×1 IMPLANT
SUT VIC AB 0 CT1 8-18 (SUTURE) ×2
SUT VIC AB 2-0 CP2 18 (SUTURE) ×2 IMPLANT
SUT VIC AB 3-0 SH 8-18 (SUTURE) ×2 IMPLANT
TAPE STRIPS DRAPE STRL (GAUZE/BANDAGES/DRESSINGS) ×1 IMPLANT
TOWEL OR 17X24 6PK STRL BLUE (TOWEL DISPOSABLE) ×2 IMPLANT
TOWEL OR 17X26 10 PK STRL BLUE (TOWEL DISPOSABLE) ×2 IMPLANT
WATER STERILE IRR 1000ML POUR (IV SOLUTION) ×2 IMPLANT

## 2015-05-16 NOTE — Anesthesia Procedure Notes (Signed)
Procedure Name: Intubation Date/Time: 05/16/2015 12:58 PM Performed by: Izora Gala Pre-anesthesia Checklist: Patient identified, Emergency Drugs available, Suction available, Patient being monitored and Timeout performed Patient Re-evaluated:Patient Re-evaluated prior to inductionOxygen Delivery Method: Circle system utilized Preoxygenation: Pre-oxygenation with 100% oxygen Intubation Type: IV induction Ventilation: Mask ventilation without difficulty Laryngoscope Size: Miller and 2 Grade View: Grade I Airway Equipment and Method: Stylet Placement Confirmation: ETT inserted through vocal cords under direct vision,  positive ETCO2 and breath sounds checked- equal and bilateral Secured at: 22 cm Tube secured with: Tape Dental Injury: Teeth and Oropharynx as per pre-operative assessment  Comments:  mass noted on left side of vocal cords during intubation

## 2015-05-16 NOTE — Anesthesia Preprocedure Evaluation (Signed)
Anesthesia Evaluation  Patient identified by MRN, date of birth, ID band Patient awake    Reviewed: Allergy & Precautions, NPO status , Patient's Chart, lab work & pertinent test results  Airway Mallampati: II  TM Distance: >3 FB Neck ROM: Full    Dental no notable dental hx.    Pulmonary Current Smoker,    Pulmonary exam normal breath sounds clear to auscultation       Cardiovascular negative cardio ROS Normal cardiovascular exam Rhythm:Regular Rate:Normal     Neuro/Psych negative neurological ROS  negative psych ROS   GI/Hepatic negative GI ROS, Neg liver ROS,   Endo/Other  diabetes, Insulin Dependent  Renal/GU negative Renal ROS  negative genitourinary   Musculoskeletal negative musculoskeletal ROS (+)   Abdominal   Peds negative pediatric ROS (+)  Hematology negative hematology ROS (+)   Anesthesia Other Findings   Reproductive/Obstetrics negative OB ROS                             Anesthesia Physical Anesthesia Plan  ASA: III  Anesthesia Plan: General   Post-op Pain Management:    Induction: Intravenous  Airway Management Planned: Oral ETT  Additional Equipment:   Intra-op Plan:   Post-operative Plan: Extubation in OR  Informed Consent: I have reviewed the patients History and Physical, chart, labs and discussed the procedure including the risks, benefits and alternatives for the proposed anesthesia with the patient or authorized representative who has indicated his/her understanding and acceptance.   Dental advisory given  Plan Discussed with: CRNA and Surgeon  Anesthesia Plan Comments:         Anesthesia Quick Evaluation

## 2015-05-16 NOTE — Anesthesia Postprocedure Evaluation (Signed)
  Anesthesia Post-op Note  Patient: Brandy Fisher  Procedure(s) Performed: Procedure(s) (LRB): Laminectomy and Foraminotomy - L4-L5 - left, resection of synovial cyst (Left)  Patient Location: PACU  Anesthesia Type: General  Level of Consciousness: awake and alert   Airway and Oxygen Therapy: Patient Spontanous Breathing  Post-op Pain: mild  Post-op Assessment: Post-op Vital signs reviewed, Patient's Cardiovascular Status Stable, Respiratory Function Stable, Patent Airway and No signs of Nausea or vomiting  Last Vitals:  Filed Vitals:   05/16/15 1424  BP: 142/105  Pulse: 76  Temp:   Resp: 33    Post-op Vital Signs: stable   Complications: No apparent anesthesia complications

## 2015-05-16 NOTE — H&P (Signed)
Subjective: Patient is a 59 y.o. female admitted for L4-5 LL. Onset of symptoms was several years ago, gradually worsening since that time.  The pain is rated intense, and is located at the across the lower back and radiates to L>R leg. The pain is described as aching and occurs all day. The symptoms have been progressive. Symptoms are exacerbated by exercise. MRI or CT showed stenosis   Past Medical History  Diagnosis Date  . Diabetes mellitus   . Arthritis   . Osteomyelitis of foot, left, acute ([place]) 11/09/2013  . Cough     GREEN SPUTUM     Past Surgical History  Procedure Laterality Date  . Cesarean section    . Breast surgery      Left breast biopsy, negative  . Cholecystectomy    . Hip arthroplasty  03/16/2011    Procedure: ARTHROPLASTY BIPOLAR HIP;  Surgeon: Sanjuana Kava;  Location: [facility];  Service: Orthopedics;  Laterality: Right;    Prior to Admission medications   Medication Sig Start Date End Date Taking? Authorizing Provider  HYDROcodone-acetaminophen (NORCO) 10-325 MG tablet Take 1 tablet by mouth every 6 (six) hours as needed for severe pain.   Yes Historical Provider, MD  insulin aspart (NOVOLOG) 100 UNIT/ML injection Inject 5 Units into the skin 3 (three) times daily before meals. 11/11/13  Yes Kathie Dike, MD  metFORMIN (GLUCOPHAGE) 1000 MG tablet Take 1 tablet by mouth 2 (two) times daily. 04/08/15  Yes Historical Provider, MD  ibuprofen (ADVIL,MOTRIN) 200 MG tablet Take 800 mg by mouth every 6 (six) hours as needed. pain    Historical Provider, MD  insulin glargine (LANTUS) 100 UNIT/ML injection Inject 0.3 mLs (30 Units total) into the skin daily. Patient not taking: Reported on 05/09/2015 11/11/13   Kathie Dike, MD   Allergies  Allergen Reactions  . Morphine And Related Nausea And Vomiting    Social History  Substance Use Topics  . Smoking status: Current Every Day Smoker -- 0.50 packs/day for 25 years    Types: Cigarettes  . Smokeless tobacco: Not on file  .  Alcohol Use: No    Family History  Problem Relation Age of Onset  . Diabetes Mother      Review of Systems  Positive ROS: neg  All other systems have been reviewed and were otherwise negative with the exception of those mentioned in the HPI and as above.  Objective: Vital signs in last 24 hours: Temp:  [97.9 F (36.6 C)] 97.9 F (36.6 C) (11/10 0857) Pulse Rate:  [73] 73 (11/10 0857) Resp:  [18] 18 (11/10 0857) BP: (134)/(59) 134/59 mmHg (11/10 0857) SpO2:  [98 %] 98 % (11/10 0857) Weight:  [83.507 kg (184 lb 1.6 oz)] 83.507 kg (184 lb 1.6 oz) (11/10 0857)  General Appearance: Alert, cooperative, no distress, appears stated age Head: Normocephalic, without obvious abnormality, atraumatic Eyes: PERRL, conjunctiva/corneas clear, EOM's intact    Neck: Supple, symmetrical, trachea midline Back: Symmetric, no curvature, ROM normal, no CVA tenderness Lungs:  respirations unlabored Heart: Regular rate and rhythm Abdomen: Soft, non-tender Extremities: Extremities normal, atraumatic, no cyanosis or edema Pulses: 2+ and symmetric all extremities Skin: Skin color, texture, turgor normal, no rashes or lesions  NEUROLOGIC:   Mental status: Alert and oriented x4,  no aphasia, good attention span, fund of knowledge, and memory Motor Exam - grossly normal Sensory Exam - grossly normal Reflexes: 1= Coordination - grossly normal Gait - grossly normal Balance - grossly normal Cranial Nerves: I: smell  Not tested  II: visual acuity  OS: nl    OD: nl  II: visual fields Full to confrontation  II: pupils Equal, round, reactive to light  III,VII: ptosis None  III,IV,VI: extraocular muscles  Full ROM  V: mastication Normal  V: facial light touch sensation  Normal  V,VII: corneal reflex  Present  VII: facial muscle function - upper  Normal  VII: facial muscle function - lower Normal  VIII: hearing Not tested  IX: soft palate elevation  Normal  IX,X: gag reflex Present  XI: trapezius  strength  5/5  XI: sternocleidomastoid strength 5/5  XI: neck flexion strength  5/5  XII: tongue strength  Normal    Data Review Lab Results  Component Value Date   WBC 8.9 05/09/2015   HGB 12.7 05/09/2015   HCT 38.8 05/09/2015   MCV 88.6 05/09/2015   PLT 172 05/09/2015   Lab Results  Component Value Date   NA 138 05/09/2015   K 4.5 05/09/2015   CL 103 05/09/2015   CO2 29 05/09/2015   BUN 14 05/09/2015   CREATININE 0.67 05/09/2015   GLUCOSE 145* 05/09/2015   Lab Results  Component Value Date   INR 0.98 05/09/2015    Assessment/Plan: Patient admitted for DLL L4-5. Patient has failed a reasonable attempt at conservative therapy.  I explained the condition and procedure to the patient and answered any questions.  Patient wishes to proceed with procedure as planned. Understands risks/ benefits and typical outcomes of procedure.   Brandy Fisher S 05/16/2015 11:20 AM

## 2015-05-16 NOTE — Progress Notes (Signed)
Utilization review completed.  

## 2015-05-16 NOTE — Transfer of Care (Signed)
Immediate Anesthesia Transfer of Care Note  Patient: Brandy Fisher  Procedure(s) Performed: Procedure(s) with comments: Laminectomy and Foraminotomy - L4-L5 - left, resection of synovial cyst (Left) - Laminectomy and Foraminotomy - L4-L5 - left, resection of synovial cyst  Patient Location: PACU  Anesthesia Type:General  Level of Consciousness: awake, oriented and patient cooperative  Airway & Oxygen Therapy: Patient Spontanous Breathing and Patient connected to nasal cannula oxygen  Post-op Assessment: Report given to RN, Post -op Vital signs reviewed and stable and Patient moving all extremities  Post vital signs: Reviewed and stable  Last Vitals:  Filed Vitals:   05/16/15 0857  BP: 134/59  Pulse: 73  Temp: 36.6 C  Resp: 18    Complications: No apparent anesthesia complications

## 2015-05-16 NOTE — Op Note (Signed)
05/16/2015  2:04 PM  PATIENT:  Brandy Fisher  59 y.o. 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 Rita Ohara  ANESTHESIA:   General  EBL: Less than 50 ml  Total I/O In: 1000 [I.V.:1000] Out: -   BLOOD ADMINISTERED:none  DRAINS: None   SPECIMEN:  No Specimen  INDICATION FOR PROCEDURE: This patient presented with a chronic history of severe back and leg pain. MRI showed severe spinal stenosis with synovial cyst at L4-5. She tried medical management without relief. I recommended decompressive laminectomy and resection of the synovial cyst. Patient understood the risks, benefits, and alternatives and potential outcomes and wished to proceed.  PROCEDURE DETAILS: The patient was taken to the operating room and after induction of adequate generalized endotracheal anesthesia, the patient was rolled into the prone position on the Wilson frame and all pressure points were padded. The lumbar region was cleaned and then prepped with DuraPrep and draped in the usual sterile fashion. 5 cc of local anesthesia was injected and then a dorsal midline incision was made and carried down to the lumbo sacral fascia. The fascia was opened and the paraspinous musculature was taken down in a subperiosteal fashion to expose L4-5 on the left. Intraoperative x-ray confirmed my level, and then I used a combination of the high-speed drill and the Kerrison punches to perform a hemilaminectomy, medial facetectomy, and foraminotomy at L4-5 on the left. The underlying yellow ligament was opened and removed in a  piecemeal fashion to expose the underlying dura and exiting nerve root. I undercut the lateral recess and dissected down until I was medial to and distal to the pedicle. There was a synovial cyst stuck to the lateral edge of the dura and this was peeled away and resected. The nerve root was well decompressed. We then gently retracted the nerve root medially with a retractor, coagulated the epidural venous vasculature, and inspected the disc space. I found no disc herniation. I then drilled up under the spinous process and performed a sublaminar decompression to decompress the central canal and the right lateral recess. I then palpated with a coronary dilator along the nerve root and into the foramen to assure adequate decompression. I felt no more compression of the nerve root. I irrigated with saline solution containing bacitracin. Achieved hemostasis with bipolar cautery, lined the dura with Gelfoam, and then closed the fascia with 0 Vicryl. I closed the subcutaneous tissues with 2-0 Vicryl and the subcuticular tissues with 3-0 Vicryl. The skin was then closed with benzoin and Steri-Strips. The drapes were removed, a sterile dressing was applied. The patient was awakened from general anesthesia and transferred to the recovery room in stable condition. At the end of the procedure all sponge, needle and instrument counts were correct.   PLAN OF CARE: Admit for overnight observation  PATIENT DISPOSITION:  PACU - hemodynamically stable.   Delay start of Pharmacological VTE agent (>24hrs) due to surgical blood loss or risk of bleeding:  yes

## 2015-05-17 LAB — GLUCOSE, CAPILLARY: Glucose-Capillary: 170 mg/dL — ABNORMAL HIGH (ref 65–99)

## 2015-05-17 MED ORDER — HYDROCODONE-ACETAMINOPHEN 10-325 MG PO TABS: 1.0000 | ORAL_TABLET | Freq: Four times a day (QID) | ORAL | Status: DC | PRN

## 2015-05-17 NOTE — Evaluation (Signed)
Physical Therapy Evaluation Patient Details Name: Brandy Fisher MRN: [id number] DOB: [birth year] Today's Date: 05/17/2015   History of Present Illness  58 yo female with L4-5 laminectomy and foraminotomy  Clinical Impression  Pt was able to demonstrate competence with back precautions and will be able to ask for help with sons at home. Has good awareness of her spine and no back brace to instruct.  Handout covered and pt requests for equipment are here.    Follow Up Recommendations No PT follow up    Equipment Recommendations  3in1 (PT) (shower chair per pt)    Recommendations for Other Services       Precautions / Restrictions Precautions Precautions: Fall;Back Precaution Booklet Issued: Yes (comment) Precaution Comments: reviewed bed mob and precautions Restrictions Weight Bearing Restrictions: No      Mobility  Bed Mobility Overal bed mobility: Modified Independent             General bed mobility comments: reminders for back precautions with hand out  Transfers Overall transfer level: Modified independent Equipment used: Rolling walker (2 wheeled)             General transfer comment: cued hand placement  Ambulation/Gait Ambulation/Gait assistance: Min guard;Supervision Ambulation Distance (Feet): 200 Feet Assistive device: Rolling walker (2 wheeled) Gait Pattern/deviations: Step-through pattern;Wide base of support;Decreased stride length Gait velocity: reduced but careful Gait velocity interpretation: Below normal speed for age/gender    Stairs Stairs: Yes Stairs assistance: Supervision Stair Management: One rail Right Number of Stairs: 16 General stair comments: step to then step through with good careful control  Wheelchair Mobility    Modified Rankin (Stroke Patients Only)       Balance                                             Pertinent Vitals/Pain Pain Assessment: 0-10 Pain Score: 7  Pain Location:  central back Pain Descriptors / Indicators: Operative site guarding;Aching Pain Intervention(s): Limited activity within patient's tolerance;Repositioned;Premedicated before session;Monitored during session    Home Living Family/patient expects to be discharged to:: Private residence Living Arrangements: Children Available Help at Discharge: Family;Available 24 hours/day Type of Home: House Home Access: Stairs to enter Entrance Stairs-Rails: None Entrance Stairs-Number of Steps: 1+2 Home Layout: Two level Home Equipment: Environmental consultant - 2 wheels      Prior Function Level of Independence: Independent with assistive device(s)               Hand Dominance        Extremity/Trunk Assessment   Upper Extremity Assessment: Overall WFL for tasks assessed           Lower Extremity Assessment: Overall WFL for tasks assessed      Cervical / Trunk Assessment: Normal  Communication   Communication: No difficulties  Cognition Arousal/Alertness: Awake/alert Behavior During Therapy: WFL for tasks assessed/performed Overall Cognitive Status: Within Functional Limits for tasks assessed                      General Comments General comments (skin integrity, edema, etc.): Skin at incision clean and no irritation observed    Exercises        Assessment/Plan    PT Assessment Patent does not need any further PT services  PT Diagnosis Abnormality of gait   PT Problem List    PT Treatment Interventions  PT Goals (Current goals can be found in the Care Plan section) Acute Rehab PT Goals Patient Stated Goal: to feel better PT Goal Formulation: All assessment and education complete, DC therapy    Frequency     Barriers to discharge        Co-evaluation               End of Session   Activity Tolerance: Patient tolerated treatment well Patient left: in bed;with call bell/phone within reach Nurse Communication: Mobility status         Time:  [id number] PT Time Calculation (min) (ACUTE ONLY): 18 min   Charges:   PT Evaluation $Initial PT Evaluation Tier I: 1 Procedure     PT G CodesRamond Dial 05/20/15, 7:20 AM   Mee Hives, PT MS Acute Rehab Dept. Number: [phone] [id number] and [place] [phone]

## 2015-05-17 NOTE — Discharge Summary (Signed)
Physician Discharge Summary  Patient ID: Brandy Fisher MRN: [id number] DOB/AGE: [birth year] 59 y.o.  Admit date: 05/16/2015 Discharge date: 05/17/2015  Admission Diagnoses: lumbar stenosis    Discharge Diagnoses: same   Discharged Condition: good  Hospital Course: The patient was admitted on 05/16/2015 and taken to the operating room where the patient underwent DLL L4-5. The patient tolerated the procedure well and was taken to the recovery room and then to the floor in stable condition. The hospital course was routine. There were no complications. The wound remained clean dry and intact. Pt had appropriate back soreness. No complaints of leg pain or new N/T/W. The patient remained afebrile with stable vital signs, and tolerated a regular diet. The patient continued to increase activities, and pain was well controlled with oral pain medications.   Consults: None  Significant Diagnostic Studies:  Results for orders placed or performed during the hospital encounter of 05/16/15  Glucose, capillary  Result Value Ref Range   Glucose-Capillary 128 (H) 65 - 99 mg/dL  Glucose, capillary  Result Value Ref Range   Glucose-Capillary 134 (H) 65 - 99 mg/dL  Glucose, capillary  Result Value Ref Range   Glucose-Capillary 133 (H) 65 - 99 mg/dL  Glucose, capillary  Result Value Ref Range   Glucose-Capillary 199 (H) 65 - 99 mg/dL   Comment 1 Notify RN    Comment 2 Document in Chart   Glucose, capillary  Result Value Ref Range   Glucose-Capillary 235 (H) 65 - 99 mg/dL   Comment 1 Notify RN    Comment 2 Document in Chart   Glucose, capillary  Result Value Ref Range   Glucose-Capillary 170 (H) 65 - 99 mg/dL   Comment 1 Notify RN    Comment 2 Document in Chart     Chest 2 View  05/09/2015  CLINICAL DATA:  Spinal stenosis. EXAM: CHEST  2 VIEW COMPARISON:  Chest x-ray 11/11/2013. FINDINGS: Mediastinum and hilar structures normal. The lungs are clear. Diffuse degenerative change thoracic  spine. Mild scoliosis. IMPRESSION: 1. No acute cardiopulmonary disease. 2. Diffuse degenerative changes thoracic spine with mild scoliosis. Electronically Signed   By: Marcello Moores  Register   On: 05/09/2015 12:28   Dg Lumbar Spine 2-3 Views  05/16/2015  CLINICAL DATA:  L4-5 laminectomy and foraminotomy EXAM: LUMBAR SPINE - 2-3 VIEW COMPARISON:  04/30/2015 lumbar spine radiograph FINDINGS: Two portable cross-table lateral intraoperative lumbar spine radiographs were provided. On the initial radiograph, the tip of a posterior approach surgical marking device overlies the L5 spinous process. On the second radiograph, the tip of a posterior approach surgical marking device overlies the posterior low back soft tissues at the level of the L4-5 disc. IMPRESSION: Posterior approach surgical marking device positions as described. Electronically Signed   By: Ilona Sorrel M.D.   On: 05/16/2015 14:50    Antibiotics:  Anti-infectives    Start     Dose/Rate Route Frequency Ordered Stop   05/16/15 2100  ceFAZolin (ANCEF) IVPB 1 g/50 mL premix     1 g 100 mL/hr over 30 Minutes Intravenous Every 8 hours 05/16/15 1532 05/17/15 0356   05/16/15 1340  bacitracin 50,000 Units in sodium chloride irrigation 0.9 % 500 mL irrigation  Status:  Discontinued       As needed 05/16/15 1341 05/16/15 1412   05/16/15 1000  ceFAZolin (ANCEF) IVPB 2 g/50 mL premix     2 g 100 mL/hr over 30 Minutes Intravenous To [facility] 05/15/15 1357 05/16/15 1300  Discharge Exam: Blood pressure 105/52, pulse 61, temperature 97.6 F (36.4 C), temperature source Oral, resp. rate 18, height 5' 7.5" (1.715 m), weight 83.507 kg (184 lb 1.6 oz), SpO2 99 %. Neurologic: Grossly normal  Dressing dry  Discharge Medications:     Medication List    TAKE these medications        HYDROcodone-acetaminophen 10-325 MG tablet  Commonly known as:  NORCO  Take 1 tablet by mouth every 6 (six) hours as needed for severe pain.     ibuprofen  200 MG tablet  Commonly known as:  ADVIL,MOTRIN  Take 800 mg by mouth every 6 (six) hours as needed. pain     insulin aspart 100 UNIT/ML injection  Commonly known as:  novoLOG  Inject 5 Units into the skin 3 (three) times daily before meals.     insulin glargine 100 UNIT/ML injection  Commonly known as:  LANTUS  Inject 0.3 mLs (30 Units total) into the skin daily.     metFORMIN 1000 MG tablet  Commonly known as:  GLUCOPHAGE  Take 1 tablet by mouth 2 (two) times daily.        Disposition: home   Final Dx: DLL L4-5      Discharge Instructions     Remove dressing in 72 hours    Complete by:  As directed      Call MD for:  difficulty breathing, headache or visual disturbances    Complete by:  As directed      Call MD for:  persistant nausea and vomiting    Complete by:  As directed      Call MD for:  redness, tenderness, or signs of infection (pain, swelling, redness, odor or green/yellow discharge around incision site)    Complete by:  As directed      Call MD for:  severe uncontrolled pain    Complete by:  As directed      Call MD for:  temperature >100.4    Complete by:  As directed      Diet - low sodium heart healthy    Complete by:  As directed      Discharge instructions    Complete by:  As directed   No lifting, bending or twisting, may shower, no driving     Increase activity slowly    Complete by:  As directed            Follow-up Information    Follow up with Emile Kyllo S, MD. Schedule an appointment as soon as possible for a visit in 3 weeks.   Specialty:  Neurosurgery   Contact information:   [address] [place] [address] [phone]        Signed: Eustace Moore 05/17/2015, 9:04 AM

## 2015-05-17 NOTE — Discharge Instructions (Signed)

## 2015-05-17 NOTE — Progress Notes (Signed)
Pt and daughter given D/C instructions with Rx, verbal understanding was provided. Pt's incision is covered with gauze dressing and has minimal drainage. Pt's IV was removed prior to D/C. Pt received RW and 3-n-1 from [place] prior to D/C. Pt D/C'd home via wheelchair @ 1125 per MD order. Pt is stable @ D/C and has no other needs at this time. Holli Humbles, RN

## 2015-05-20 ENCOUNTER — Encounter (HOSPITAL_COMMUNITY): Payer: Self-pay | Admitting: Neurological Surgery

## 2015-08-27 ENCOUNTER — Encounter: Payer: Self-pay | Admitting: Orthopaedic Surgery

## 2015-08-27 ENCOUNTER — Ambulatory Visit (INDEPENDENT_AMBULATORY_CARE_PROVIDER_SITE_OTHER): Payer: Commercial Managed Care - HMO | Admitting: Orthopaedic Surgery

## 2015-08-27 ENCOUNTER — Ambulatory Visit (INDEPENDENT_AMBULATORY_CARE_PROVIDER_SITE_OTHER): Payer: Commercial Managed Care - HMO

## 2015-08-27 VITALS — BP 126/61 | HR 70 | Temp 97.5°F | Ht 67.5 in | Wt 180.2 lb

## 2015-08-27 DIAGNOSIS — M25552 Pain in left hip: Secondary | ICD-10-CM | POA: Diagnosis not present

## 2015-08-27 DIAGNOSIS — M25551 Pain in right hip: Secondary | ICD-10-CM

## 2015-08-27 MED ORDER — NAPROXEN 500 MG PO TABS
500.0000 mg | ORAL_TABLET | Freq: Two times a day (BID) | ORAL | Status: DC
Start: 2015-08-27 — End: 2018-04-04

## 2015-08-27 NOTE — Patient Instructions (Signed)
Begin Naprosyn Call if any problem

## 2015-08-27 NOTE — Progress Notes (Signed)
Patient LI:[id number] [name], female DOB:02/01/1956, 60 y.o. WI:[id number]  Chief Complaint  Patient presents with  . Hip Pain    Right hip pain    HPI  Brandy Fisher is a 60 y.o. female with complaints of pain in the right and left hips.  I did a bipolar hip on her in 2010 she says.  She has had some pain in the right hip at times on cold days.  She has no trauma, no giving way, no redness, no fever.  She has pain in the left hip that is getting worse.  She is on no NSAIDs.  Dr. Karie Kirks is managing her pain and her diabetes.  She had back surgery several months ago.  She has signs of neuropathy in her feet she has been told.  Hip Pain  There was no injury mechanism. The pain is present in the right hip and left hip. The quality of the pain is described as aching. The pain is at a severity of 4/10. The pain is moderate. The pain has been worsening since onset. Associated symptoms include an inability to bear weight, a loss of motion, a loss of sensation, numbness and tingling. The symptoms are aggravated by weight bearing. She has tried acetaminophen, heat, ice, NSAIDs and rest for the symptoms. The treatment provided mild relief.    Body mass index is 27.79 kg/(m^2).   Review of Systems  Constitutional:       Patient has Diabetes Mellitus. Patient does not have hypertension. Patient has COPD or shortness of breath. Patient does not have BMI > 35. Patient has current smoking history.  Musculoskeletal: Positive for back pain and arthralgias.  Neurological: Positive for tingling and numbness.    Past Medical History  Diagnosis Date  . Diabetes mellitus   . Arthritis   . Osteomyelitis of foot, left, acute ([place]) 11/09/2013  . Cough     GREEN SPUTUM     Past Surgical History  Procedure Laterality Date  . Cesarean section    . Breast surgery      Left breast biopsy, negative  . Cholecystectomy    . Hip arthroplasty  03/16/2011    Procedure: ARTHROPLASTY BIPOLAR HIP;  Surgeon:  Sanjuana Kava;  Location: [facility];  Service: Orthopedics;  Laterality: Right;  . Lumbar laminectomy/decompression microdiscectomy Left 05/16/2015    Procedure: Laminectomy and Foraminotomy - L4-L5 - left, resection of synovial cyst;  Surgeon: Eustace Moore, MD;  Location: [facility];  Service: Neurosurgery;  Laterality: Left;  Laminectomy and Foraminotomy - L4-L5 - left, resection of synovial cyst    Family History  Problem Relation Age of Onset  . Diabetes Mother     Social History Social History  Substance Use Topics  . Smoking status: Current Every Day Smoker -- 0.50 packs/day for 25 years    Types: Cigarettes  . Smokeless tobacco: None  . Alcohol Use: No    Allergies  Allergen Reactions  . Morphine And Related Nausea And Vomiting  . Tramadol Nausea Only    Current Outpatient Prescriptions  Medication Sig Dispense Refill  . HYDROcodone-acetaminophen (NORCO) 10-325 MG tablet Take 1 tablet by mouth every 6 (six) hours as needed for severe pain. 30 tablet 0  . ibuprofen (ADVIL,MOTRIN) 200 MG tablet Take 800 mg by mouth every 6 (six) hours as needed. pain    . insulin aspart (NOVOLOG) 100 UNIT/ML injection Inject 5 Units into the skin 3 (three) times daily before meals. 10 mL 11  .  insulin glargine (LANTUS) 100 UNIT/ML injection Inject 0.3 mLs (30 Units total) into the skin daily. 10 mL 11  . LORazepam (ATIVAN) 0.5 MG tablet Take 0.5 mg by mouth at bedtime.    . metFORMIN (GLUCOPHAGE) 1000 MG tablet Take 1 tablet by mouth 2 (two) times daily. Reported on 08/27/2015     No current facility-administered medications for this visit.     Physical Exam  Blood pressure 126/61, pulse 70, temperature 97.5 F (36.4 C), height 5' 7.5" (1.715 m), weight 180 lb 3.2 oz (81.738 kg).  Constitutional: overall normal hygiene, normal nutrition, well developed, normal grooming, normal body habitus. Assistive device:cane  Musculoskeletal: gait and station Limp right, muscle tone and strength  are normal, no tremors or atrophy is present.  .  Neurological: coordination overall normal.  Deep tendon reflex/nerve stretch intact.  Sensation is not normal in the feet, decreased.  Consistent with peripheral neuropathy.  Cranial nerves II-XII intact.   Skin:   normal overall no scars, lesions, ulcers or rashes. No psoriasis.  Psychiatric: Alert and oriented x 3.  Recent memory intact, remote memory unclear.  Normal mood and affect. Well groomed.  Good eye contact.  Cardiovascular: overall no swelling, no varicosities, no edema bilaterally, normal temperatures of the legs and arms, no clubbing, cyanosis and good capillary refill.  Lymphatic: palpation is normal.  Neck is normal  Upper extremities are normal  Extremities:She has tenderness over the right hip trochanteric area.  She has no redness.  Well healed right hip posterior scar.  Leg lengths equal.  Left hip tender as well but not as much as the right. Inspection normal with right hip scar Strength and tone normal Range of motion right hip flexes to 90, internal 30, external 30, extension 10, adduction and abduction normal.  Left hip flexes to 90 with pain, internal 15, external 10, extension 5, adduction and abduction only to 20.  Additional services performed: I talked to her about her x-rays.  She has diabetes and she does not know her A1C.  I have told her the importance of this study and for her to get one soon.  She has an appointment for blood work next week and will call Dr. Karie Kirks and see if this was ordered.  She had back surgery in December and is recovering from it.  She is walking better now but need to continue the walking program given her from that surgery.  She still smokes and I have talked to her about cutting back or stopping.  She says she is not ready to do that yet.  I will keep trying.  The patient has been educated about the nature of the problem(s) and counseled on treatment options.  The patient  appeared to understand what I have discussed and is in agreement with it.  PLAN Call if any problems.  Precautions discussed.  Continue current medications.   Return to clinic one month

## 2015-09-24 ENCOUNTER — Ambulatory Visit (INDEPENDENT_AMBULATORY_CARE_PROVIDER_SITE_OTHER): Payer: Commercial Managed Care - HMO | Admitting: Orthopaedic Surgery

## 2015-09-24 ENCOUNTER — Encounter: Payer: Self-pay | Admitting: Orthopaedic Surgery

## 2015-09-24 VITALS — BP 139/62 | HR 75 | Temp 97.5°F | Resp 16 | Ht 67.5 in | Wt 179.0 lb

## 2015-09-24 DIAGNOSIS — M7061 Trochanteric bursitis, right hip: Secondary | ICD-10-CM | POA: Diagnosis not present

## 2015-09-24 NOTE — Progress Notes (Signed)
Patient LI:[id number] [name], female DOB:September 29, 1955, 60 y.o. WI:[id number]  Chief Complaint  Patient presents with  . Follow-up    follow up bilateral hip pain "worse"    HPI  Brandy Fisher is a 60 y.o. female who has right hip pain over the right trochanteric area.  She is very tender here.  She has no redness, no trauma.  She is post bipolar hip on the right in 2010.  She has pain with walking.  Heat and ice have helped some.  She has no paresthesia.  HPI  Body mass index is 27.61 kg/(m^2).   Review of Systems  Constitutional:       Patient has Diabetes Mellitus. Patient does not have hypertension. Patient has COPD or shortness of breath. Patient does not have BMI > 35. Patient has current smoking history.  HENT: Positive for congestion.   Respiratory: Positive for cough, shortness of breath and wheezing.   Endocrine: Positive for cold intolerance and polyuria.  Musculoskeletal: Positive for back pain and arthralgias.  Neurological: Positive for numbness.    Past Medical History  Diagnosis Date  . Diabetes mellitus   . Arthritis   . Osteomyelitis of foot, left, acute ([place]) 11/09/2013  . Cough     GREEN SPUTUM     Past Surgical History  Procedure Laterality Date  . Cesarean section    . Breast surgery      Left breast biopsy, negative  . Cholecystectomy    . Hip arthroplasty  03/16/2011    Procedure: ARTHROPLASTY BIPOLAR HIP;  Surgeon: Sanjuana Kava;  Location: [facility];  Service: Orthopedics;  Laterality: Right;  . Lumbar laminectomy/decompression microdiscectomy Left 05/16/2015    Procedure: Laminectomy and Foraminotomy - L4-L5 - left, resection of synovial cyst;  Surgeon: Eustace Moore, MD;  Location: [place];  Service: Neurosurgery;  Laterality: Left;  Laminectomy and Foraminotomy - L4-L5 - left, resection of synovial cyst    Family History  Problem Relation Age of Onset  . Diabetes Mother     Social History Social History  Substance Use Topics  .  Smoking status: Current Every Day Smoker -- 0.50 packs/day for 25 years    Types: Cigarettes  . Smokeless tobacco: None  . Alcohol Use: No    Allergies  Allergen Reactions  . Morphine And Related Nausea And Vomiting  . Tramadol Nausea Only    Current Outpatient Prescriptions  Medication Sig Dispense Refill  . HYDROcodone-acetaminophen (NORCO) 10-325 MG tablet Take 1 tablet by mouth every 6 (six) hours as needed for severe pain. 30 tablet 0  . ibuprofen (ADVIL,MOTRIN) 200 MG tablet Take 800 mg by mouth every 6 (six) hours as needed. pain    . insulin aspart (NOVOLOG) 100 UNIT/ML injection Inject 5 Units into the skin 3 (three) times daily before meals. 10 mL 11  . insulin glargine (LANTUS) 100 UNIT/ML injection Inject 0.3 mLs (30 Units total) into the skin daily. 10 mL 11  . LORazepam (ATIVAN) 0.5 MG tablet Take 0.5 mg by mouth at bedtime.    . metFORMIN (GLUCOPHAGE) 1000 MG tablet Take 1 tablet by mouth 2 (two) times daily. Reported on 08/27/2015    . naproxen (NAPROSYN) 500 MG tablet Take 1 tablet (500 mg total) by mouth 2 (two) times daily with a meal. 60 tablet 5   No current facility-administered medications for this visit.     Physical Exam  Blood pressure 139/62, pulse 75, temperature 97.5 F (36.4 C), resp. rate 16, height 5'  7.5" (1.715 m), weight 179 lb (81.194 kg).  Constitutional: overall normal hygiene, normal nutrition, well developed, normal grooming, normal body habitus. Assistive device:none  Musculoskeletal: gait and station Limp right, muscle tone and strength are normal, no tremors or atrophy is present.  .  Neurological: coordination overall normal.  Deep tendon reflex/nerve stretch intact.  Sensation normal.  Cranial nerves II-XII intact.   Skin:   normal overall no scars, lesions, ulcers or rashes. No psoriasis.  Psychiatric: Alert and oriented x 3.  Recent memory intact, remote memory unclear.  Normal mood and affect. Well groomed.  Good eye  contact.  Cardiovascular: overall no swelling, no varicosities, no edema bilaterally, normal temperatures of the legs and arms, no clubbing, cyanosis and good capillary refill.  Lymphatic: palpation is normal.   Extremities:the right hip has tenderness over the lateral side.  There is no redness.  The left hip has no pain. Inspection normal both hips with a well healed scar right hip Strength and tone normal Range of motion full bilaterally  PROCEDURE NOTE:  The patient request injection, verbal consent was obtained.  The right trochanteric area of the hip was prepped appropriately after time out was performed.   Sterile technique was observed and injection of 1 cc of Depo-Medrol 40 mg with several cc's of plain xylocaine. Anesthesia was provided by ethyl chloride and a 20-gauge needle was used to inject the hip area. The injection was tolerated well.  A band aid dressing was applied.  The patient was advised to apply ice later today and tomorrow to the injection sight as needed.   Additional services performed: her diabetes is better controlled.  Her blood sugar was 138 this morning.  She is watching better what she eats.  However, her smoking continues.  She has wheezing today and coughing.  She is short of breath.  She does not want to talk at all about stopping smoking.    The patient has been educated about the nature of the problem(s) and counseled on treatment options.  The patient appeared to understand what I have discussed and is in agreement with it.  PLAN Call if any problems.  Precautions discussed.  Continue current medications.   Return to clinic one month.

## 2015-10-22 ENCOUNTER — Encounter: Payer: Self-pay | Admitting: Orthopaedic Surgery

## 2015-10-22 ENCOUNTER — Ambulatory Visit (INDEPENDENT_AMBULATORY_CARE_PROVIDER_SITE_OTHER): Payer: Commercial Managed Care - HMO | Admitting: Orthopaedic Surgery

## 2015-10-22 VITALS — BP 164/67 | HR 85 | Temp 97.7°F | Ht 67.0 in | Wt 179.0 lb

## 2015-10-22 DIAGNOSIS — M25551 Pain in right hip: Secondary | ICD-10-CM

## 2015-10-22 NOTE — Progress Notes (Signed)
Patient Brandy Fisher Brandy Fisher, female DOB:06/18/1956, 60 y.o. WI:[id number]  Chief Complaint  Patient presents with  . Follow-up    Right hip pain    HPI  Brandy Fisher is a 60 y.o. female who has chronic pain of the hip on the right.  She has had injection of trochanteric bursa last visit.  Her pain relief was limited.  She has no paresthesias, no trauma, no redness.  She uses ice and heat with little help.  Her diabetes is controlled but she has varying blood sugars.  She does not know her last A1C.  I will begin PT for her.  HPI  Body mass index is 28.03 kg/(m^2).  Review of Systems  Constitutional:       Patient has Diabetes Mellitus. Patient does not have hypertension. Patient has COPD or shortness of breath. Patient does not have BMI > 35. Patient has current smoking history.  HENT: Positive for congestion.   Respiratory: Positive for cough, shortness of breath and wheezing.   Endocrine: Positive for cold intolerance and polyuria.  Musculoskeletal: Positive for back pain and arthralgias.  Neurological: Positive for numbness.    Past Medical History  Diagnosis Date  . Diabetes mellitus   . Arthritis   . Osteomyelitis of foot, left, acute ([place]) 11/09/2013  . Cough     GREEN SPUTUM     Past Surgical History  Procedure Laterality Date  . Cesarean section    . Breast surgery      Left breast biopsy, negative  . Cholecystectomy    . Hip arthroplasty  03/16/2011    Procedure: ARTHROPLASTY BIPOLAR HIP;  Surgeon: Sanjuana Kava;  Location: [facility];  Service: Orthopedics;  Laterality: Right;  . Lumbar laminectomy/decompression microdiscectomy Left 05/16/2015    Procedure: Laminectomy and Foraminotomy - L4-L5 - left, resection of synovial cyst;  Surgeon: Eustace Moore, MD;  Location: [facility];  Service: Neurosurgery;  Laterality: Left;  Laminectomy and Foraminotomy - L4-L5 - left, resection of synovial cyst    Family History  Problem Relation Age of Onset  . Diabetes  Mother     Social History Social History  Substance Use Topics  . Smoking status: Current Every Day Smoker -- 0.50 packs/day for 25 years    Types: Cigarettes  . Smokeless tobacco: None  . Alcohol Use: No    Allergies  Allergen Reactions  . Morphine And Related Nausea And Vomiting  . Tramadol Nausea Only    Current Outpatient Prescriptions  Medication Sig Dispense Refill  . HYDROcodone-acetaminophen (NORCO) 10-325 MG tablet Take 1 tablet by mouth every 6 (six) hours as needed for severe pain. 30 tablet 0  . ibuprofen (ADVIL,MOTRIN) 200 MG tablet Take 800 mg by mouth every 6 (six) hours as needed. pain    . insulin aspart (NOVOLOG) 100 UNIT/ML injection Inject 5 Units into the skin 3 (three) times daily before meals. 10 mL 11  . insulin glargine (LANTUS) 100 UNIT/ML injection Inject 0.3 mLs (30 Units total) into the skin daily. 10 mL 11  . LORazepam (ATIVAN) 0.5 MG tablet Take 0.5 mg by mouth at bedtime.    . metFORMIN (GLUCOPHAGE) 1000 MG tablet Take 1 tablet by mouth 2 (two) times daily. Reported on 08/27/2015    . naproxen (NAPROSYN) 500 MG tablet Take 1 tablet (500 mg total) by mouth 2 (two) times daily with a meal. 60 tablet 5   No current facility-administered medications for this visit.     Physical Exam  Blood  pressure 164/67, pulse 85, temperature 97.7 F (36.5 C), height 5\' 7"  (1.702 m), weight 179 lb (81.194 kg).  Constitutional: overall normal hygiene, normal nutrition, well developed, normal grooming, normal body habitus. Assistive device:cane  Musculoskeletal: gait and station Limp right, muscle tone and strength are normal, no tremors or atrophy is present.  .  Neurological: coordination overall normal.  Deep tendon reflex/nerve stretch intact.  Sensation normal.  Cranial nerves II-XII intact.   Skin:   normal overall no scars, lesions, ulcers or rashes. No psoriasis.  Psychiatric: Alert and oriented x 3.  Recent memory intact, remote memory unclear.  Normal  mood and affect. Well groomed.  Good eye contact.  Cardiovascular: overall no swelling, no varicosities, no edema bilaterally, normal temperatures of the legs and arms, no clubbing, cyanosis and good capillary refill.  Lymphatic: palpation is normal. Right Hip Exam   Tenderness  The patient is experiencing tenderness in the greater trochanter.  Range of Motion  The patient has normal right hip ROM.  Muscle Strength  The patient has normal right hip strength.  Other  Erythema: absent Scars: absent Sensation: normal Pulse: present   Left Hip Exam  Left hip exam is normal.      The patient has been educated about the nature of the problem(s) and counseled on treatment options.  The patient appeared to understand what I have discussed and is in agreement with it.  Encounter Diagnosis  Name Primary?  . Right hip pain Yes    PLAN Call if any problems.  Precautions discussed.  Continue current medications.   Return to clinic 2 weeks   Begin PT.

## 2015-10-22 NOTE — Patient Instructions (Signed)
Call [facility] therapy dept to schedule therapy visits [phone]

## 2015-10-24 ENCOUNTER — Ambulatory Visit: Payer: Commercial Managed Care - HMO | Admitting: Orthopaedic Surgery

## 2015-10-28 ENCOUNTER — Ambulatory Visit (HOSPITAL_COMMUNITY): Payer: Commercial Managed Care - HMO | Attending: Orthopaedic Surgery | Admitting: Physical Therapy

## 2015-10-28 ENCOUNTER — Telehealth (HOSPITAL_COMMUNITY): Payer: Self-pay | Admitting: Physical Therapy

## 2015-10-28 NOTE — Telephone Encounter (Signed)
PT attempted to call pt concerning missed eval this morning. No answer and voicemail not set up, so therapist was unable to get a hold of pt.  12:09 PM,10/28/2015 Elly Modena PT, [facility] [phone]

## 2015-11-05 ENCOUNTER — Ambulatory Visit: Payer: Commercial Managed Care - HMO | Admitting: Orthopaedic Surgery

## 2016-01-12 IMAGING — CR DG CHEST 2V
2 series · 2 of 2 positions shown · non-contrast
Comparison: Chest x-ray 11/11/2013.

CLINICAL DATA: Spinal stenosis.

EXAM:
CHEST  2 VIEW

[w chest pa]
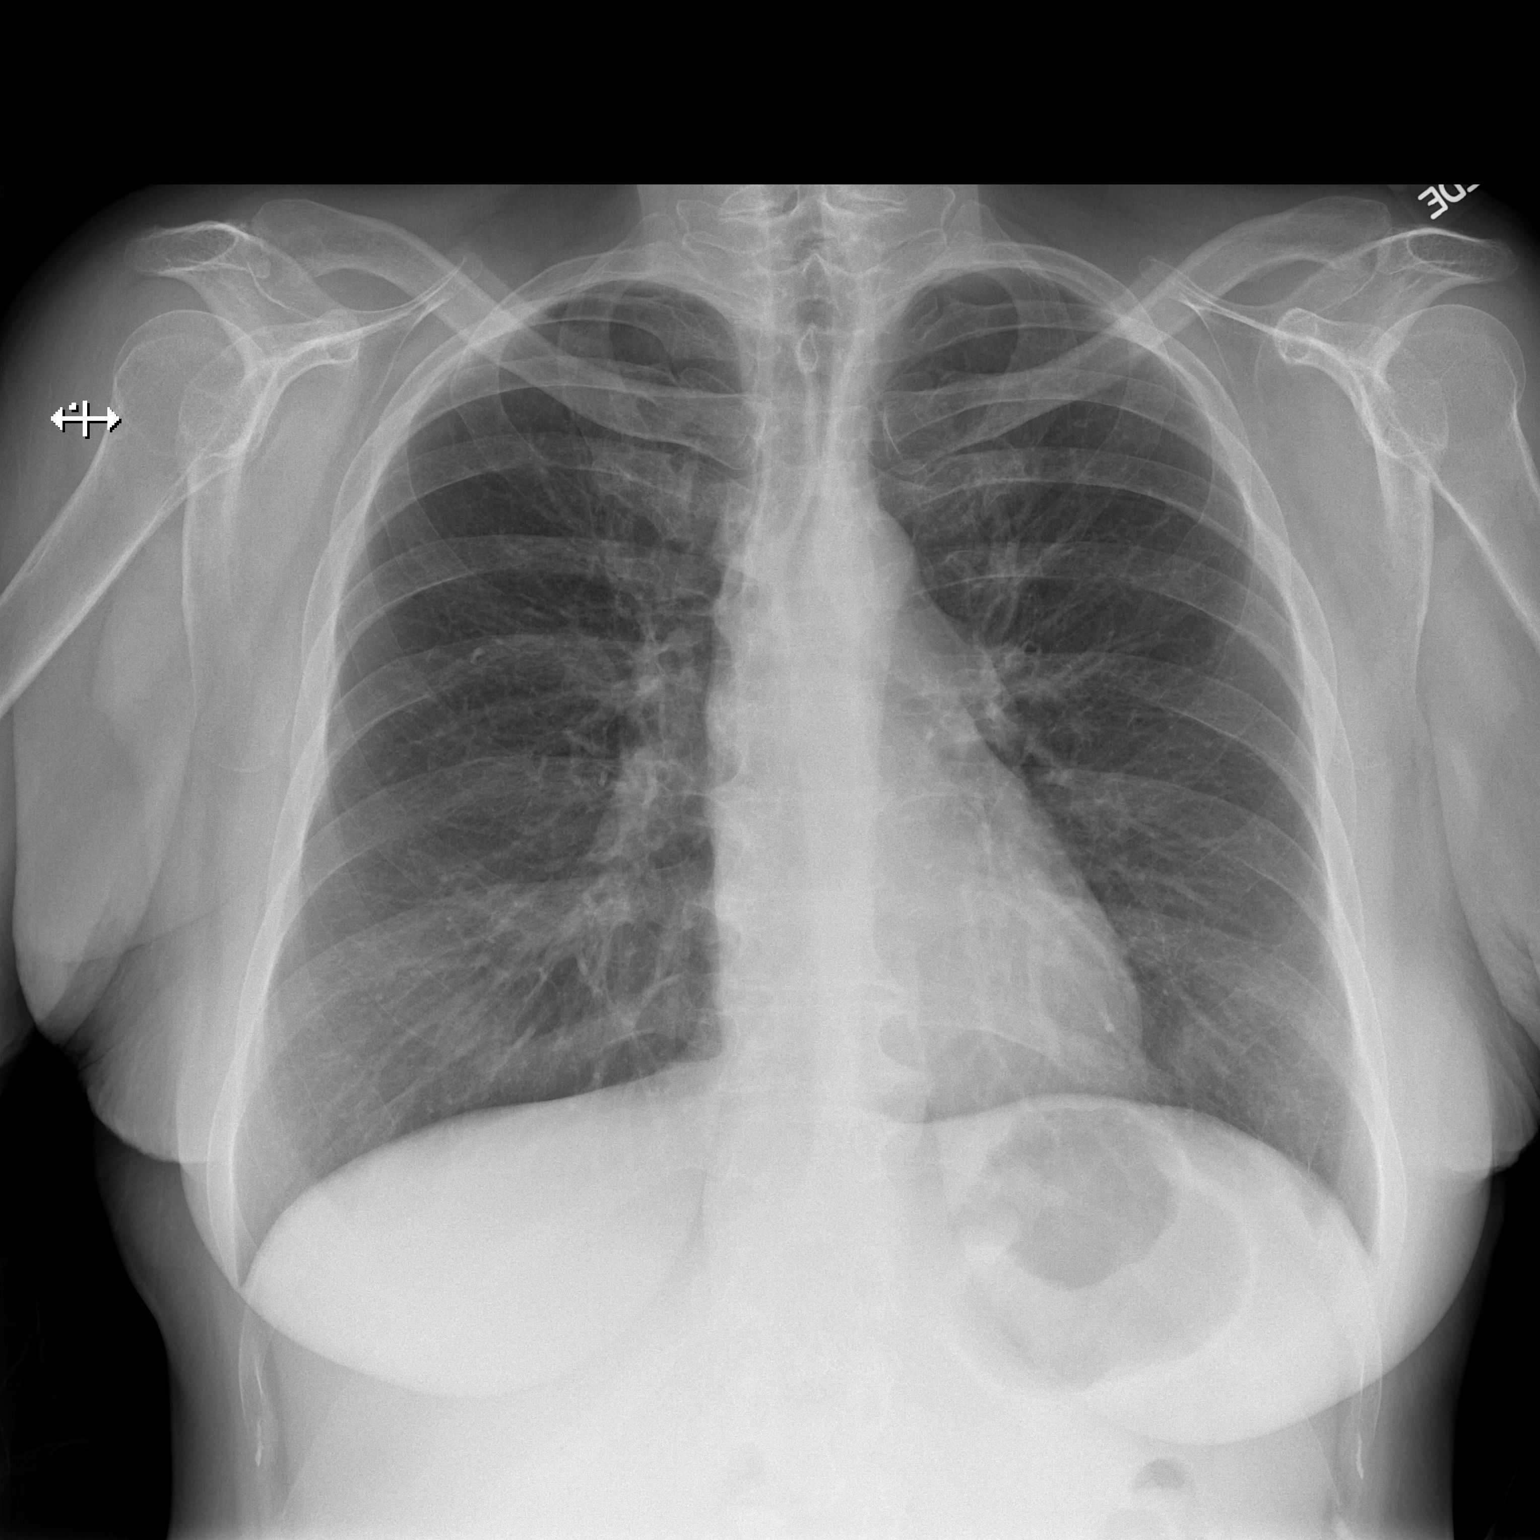

[w chest lat]
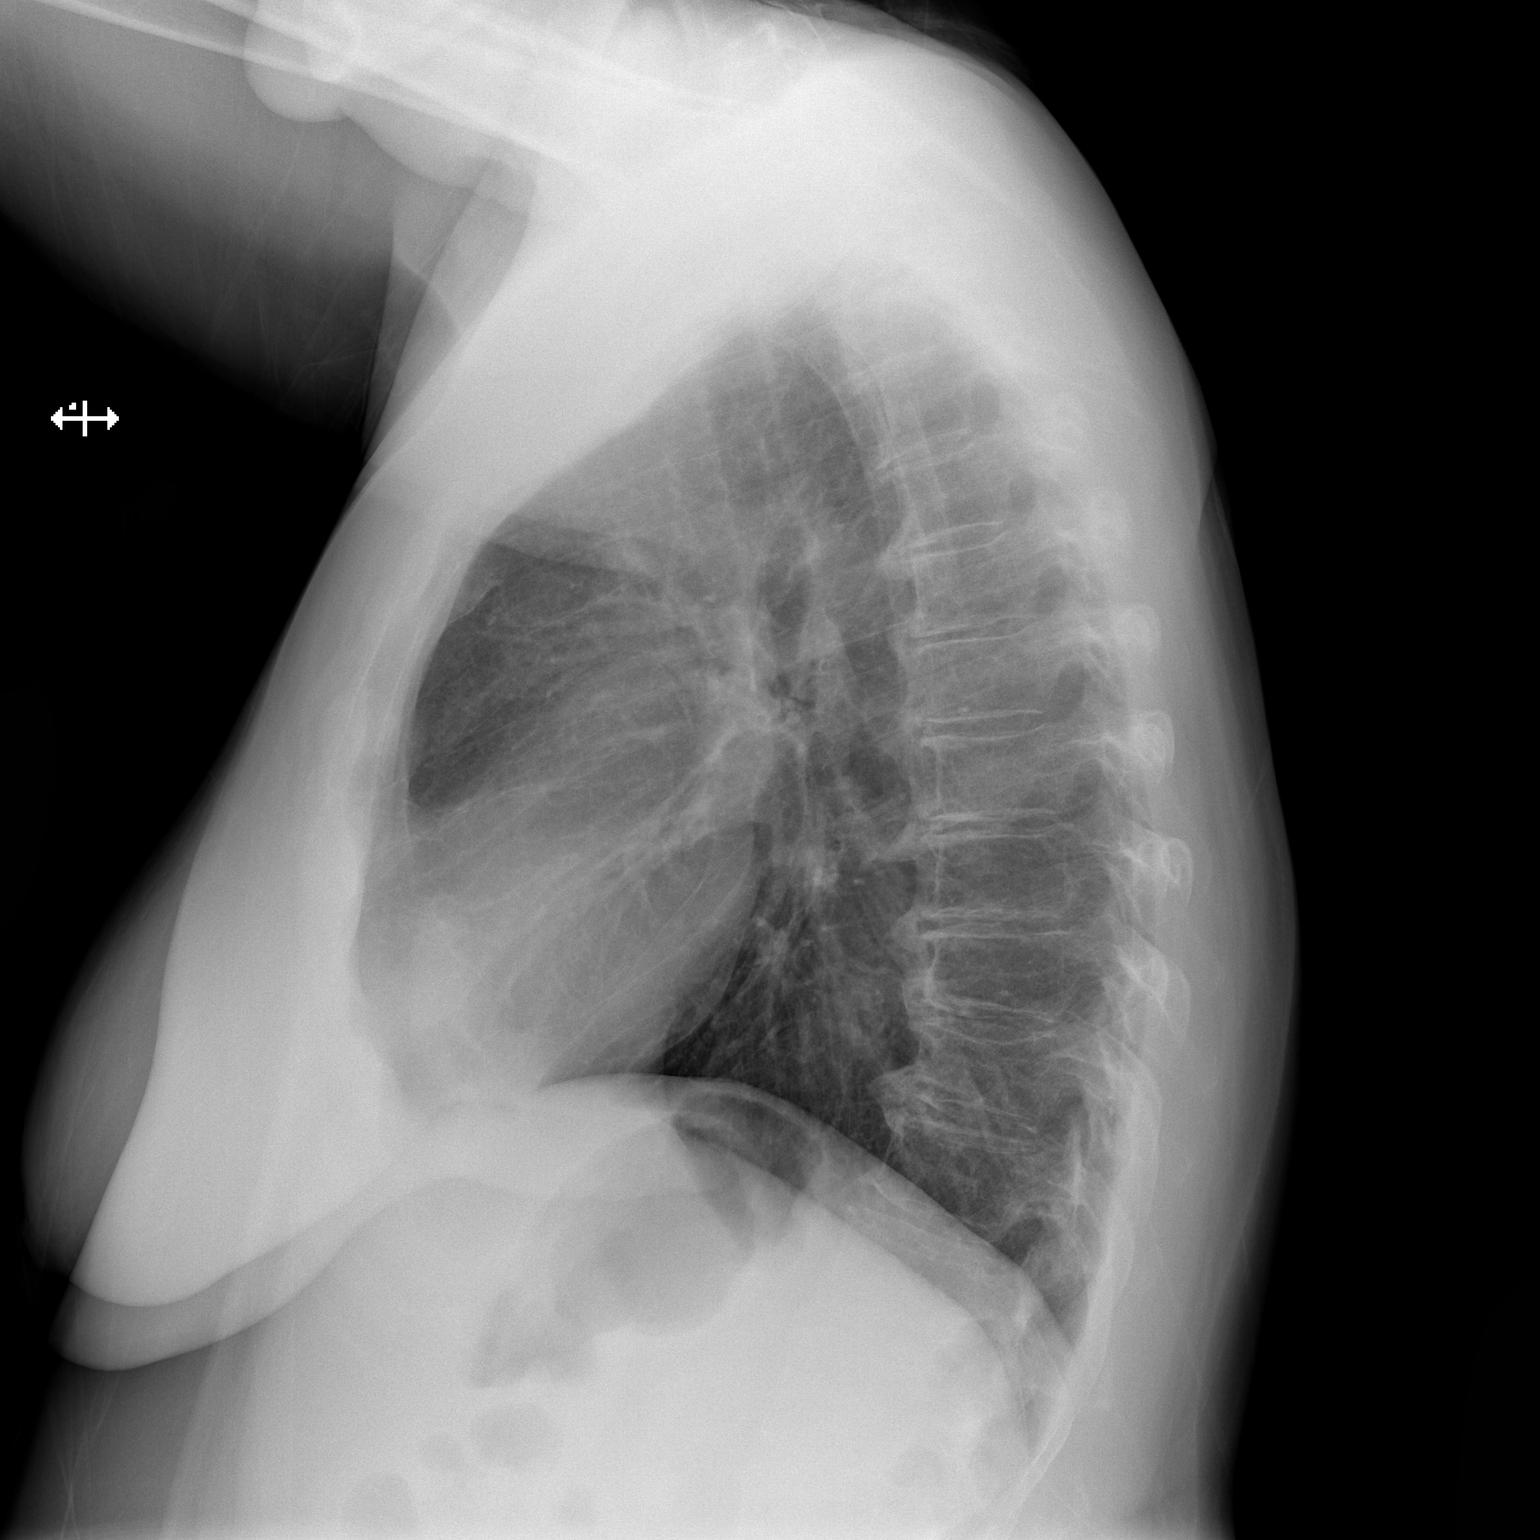

[2 of 2 positions shown; findings below may reference images not displayed]

FINDINGS: Mediastinum and hilar structures normal. The lungs are clear.
Diffuse degenerative change thoracic spine. Mild scoliosis.
IMPRESSION: 1. No acute cardiopulmonary disease.
2. Diffuse degenerative changes thoracic spine with mild scoliosis.

## 2018-03-09 ENCOUNTER — Other Ambulatory Visit (HOSPITAL_COMMUNITY): Payer: Self-pay | Admitting: Nurse Practitioner

## 2018-03-09 DIAGNOSIS — Z1231 Encounter for screening mammogram for malignant neoplasm of breast: Secondary | ICD-10-CM

## 2018-03-16 ENCOUNTER — Encounter: Payer: Self-pay | Admitting: Internal Medicine

## 2018-03-23 ENCOUNTER — Ambulatory Visit (HOSPITAL_COMMUNITY)
Admission: RE | Admit: 2018-03-23 | Discharge: 2018-03-23 | Disposition: A | Discharge status: Home / Self Care | Payer: Medicare Other | Source: Ambulatory Visit | Attending: Nurse Practitioner | Admitting: Nurse Practitioner

## 2018-03-23 DIAGNOSIS — Z1231 Encounter for screening mammogram for malignant neoplasm of breast: Secondary | ICD-10-CM | POA: Diagnosis present

## 2018-03-29 ENCOUNTER — Telehealth: Payer: Self-pay | Admitting: Gastroenterology

## 2018-03-29 ENCOUNTER — Encounter: Payer: Self-pay | Admitting: Internal Medicine

## 2018-03-29 ENCOUNTER — Ambulatory Visit: Payer: Medicare Other | Admitting: Gastroenterology

## 2018-03-29 NOTE — Telephone Encounter (Signed)
PATIENT WAS A NO SHOW AND LETTER SENT  °

## 2018-04-04 ENCOUNTER — Other Ambulatory Visit: Payer: Self-pay

## 2018-04-04 ENCOUNTER — Telehealth: Payer: Self-pay

## 2018-04-04 ENCOUNTER — Encounter: Payer: Self-pay | Admitting: Gastroenterology

## 2018-04-04 ENCOUNTER — Ambulatory Visit (INDEPENDENT_AMBULATORY_CARE_PROVIDER_SITE_OTHER): Payer: Medicare Other | Admitting: Gastroenterology

## 2018-04-04 DIAGNOSIS — Z1211 Encounter for screening for malignant neoplasm of colon: Secondary | ICD-10-CM | POA: Insufficient documentation

## 2018-04-04 DIAGNOSIS — F1190 Opioid use, unspecified, uncomplicated: Secondary | ICD-10-CM | POA: Insufficient documentation

## 2018-04-04 MED ORDER — CLENPIQ 10-3.5-12 MG-GM -GM/160ML PO SOLN: 1.0000 | Freq: Once | ORAL | 0 refills | Status: AC

## 2018-04-04 NOTE — Telephone Encounter (Signed)
Pt called office, informed her of pre-op appt.

## 2018-04-04 NOTE — Progress Notes (Signed)
CC'D TO PCP °

## 2018-04-04 NOTE — Patient Instructions (Signed)
Colonoscopy as scheduled. See separate instructions.  

## 2018-04-04 NOTE — Telephone Encounter (Signed)
Called and informed pt's son Brandy Fisher) of her pre-op appt 04/14/18 at 1:15pm. Pt gave ok to give appt to her son. Letter mailed.

## 2018-04-04 NOTE — Progress Notes (Signed)
Primary Care Physician:  Carlis Abbott, NP  Primary Gastroenterologist:  Garfield Cornea, MD   Chief Complaint  Patient presents with  . Colonoscopy    never had tcs    HPI:  Brandy Fisher is a 62 y.o. female here request of Dr. Karie Kirks for screening colonoscopy.  No prior colonoscopy.  BMS are regular.  No blood in the stool or melena.  Occasional abdominal pain.  Nothing persistent.  Occasional heartburn.  Intentional 20 pound weight loss over the past couple of years.  She has diabetes which is diet-controlled for now.  Previously on insulin/pills.  Current Outpatient Medications  Medication Sig Dispense Refill  . albuterol (PROVENTIL HFA;VENTOLIN HFA) 108 (90 Base) MCG/ACT inhaler Inhale 2 puffs into the lungs as needed for wheezing or shortness of breath.    Marland Kitchen HYDROcodone-acetaminophen (NORCO) 7.5-325 MG tablet Take 1 tablet by mouth as directed. Takes 1 tablet in am, 1 tablet at noon, 2 tablets at bedtime    . ibuprofen (ADVIL,MOTRIN) 200 MG tablet Take 800 mg by mouth every 6 (six) hours as needed. pain     No current facility-administered medications for this visit.     Allergies as of 04/04/2018 - Review Complete 04/04/2018  Allergen Reaction Noted  . Morphine and related Nausea And Vomiting 03/15/2011  . Tramadol Nausea Only 08/27/2015    Past Medical History:  Diagnosis Date  . Arthritis   . Cough    GREEN SPUTUM   . Diabetes mellitus   . Osteomyelitis of foot, left, acute ([place]) 11/09/2013    Past Surgical History:  Procedure Laterality Date  . BREAST SURGERY     Left breast biopsy, negative  . CESAREAN SECTION    . CHOLECYSTECTOMY    . HIP ARTHROPLASTY  03/16/2011   Procedure: ARTHROPLASTY BIPOLAR HIP;  Surgeon: Sanjuana Kava;  Location: [facility];  Service: Orthopedics;  Laterality: Right;  . LUMBAR LAMINECTOMY/DECOMPRESSION MICRODISCECTOMY Left 05/16/2015   Procedure: Laminectomy and Foraminotomy - L4-L5 - left, resection of synovial cyst;  Surgeon: Eustace Moore, MD;  Location: [facility];  Service: Neurosurgery;  Laterality: Left;  Laminectomy and Foraminotomy - L4-L5 - left, resection of synovial cyst    Family History  Problem Relation Age of Onset  . Diabetes Mother   . Colon cancer Neg Hx     Social History   Socioeconomic History  . Marital status: Widowed    Spouse name: Not on file  . Number of children: Not on file  . Years of education: Not on file  . Highest education level: Not on file  Occupational History  . Not on file  Social Needs  . Financial resource strain: Not on file  . Food insecurity:    Worry: Not on file    Inability: Not on file  . Transportation needs:    Medical: Not on file    Non-medical: Not on file  Tobacco Use  . Smoking status: Current Every Day Smoker    Packs/day: 0.50    Years: 25.00    Pack years: 12.50    Types: Cigarettes  . Smokeless tobacco: Never Used  Substance and Sexual Activity  . Alcohol use: No  . Drug use: No  . Sexual activity: Not on file  Lifestyle  . Physical activity:    Days per week: Not on file    Minutes per session: Not on file  . Stress: Not on file  Relationships  . Social connections:    Talks on  phone: Not on file    Gets together: Not on file    Attends religious service: Not on file    Active member of club or organization: Not on file    Attends meetings of clubs or organizations: Not on file    Relationship status: Not on file  . Intimate partner violence:    Fear of current or ex partner: Not on file    Emotionally abused: Not on file    Physically abused: Not on file    Forced sexual activity: Not on file  Other Topics Concern  . Not on file  Social History Narrative  . Not on file      ROS:  General: Negative for anorexia, weight loss, fever, chills, fatigue, weakness. Eyes: Negative for vision changes.  ENT: Negative for hoarseness, difficulty swallowing , nasal congestion. CV: Negative for chest pain, angina, palpitations,  dyspnea on exertion, peripheral edema.  Respiratory: Negative for dyspnea at rest, dyspnea on exertion, cough, sputum, wheezing.  GI: See history of present illness. GU:  Negative for dysuria, hematuria, urinary incontinence, urinary frequency, nocturnal urination.  MS: Negative for joint pain, low back pain.  Derm: Negative for rash or itching.  Neuro: Negative for weakness, abnormal sensation, seizure, frequent headaches, memory loss, confusion.  Psych: Negative for anxiety, depression, suicidal ideation, hallucinations.  Endo: Negative for unusual weight change.  Heme: Negative for bruising or bleeding. Allergy: Negative for rash or hives.    Physical Examination:  BP 96/65   Pulse 88   Temp 97.9 F (36.6 C) (Oral)   Ht 5' 7.5" (1.715 m)   Wt 158 lb 12.8 oz (72 kg)   BMI 24.50 kg/m    General: Well-nourished, well-developed in no acute distress.  Head: Normocephalic, atraumatic.   Eyes: Conjunctiva pink, no icterus. Mouth: Oropharyngeal mucosa moist and pink , no lesions erythema or exudate. Neck: Supple without thyromegaly, masses, or lymphadenopathy.  Lungs: Clear to auscultation bilaterally.  Heart: Regular rate and rhythm, no murmurs rubs or gallops.  Abdomen: Bowel sounds are normal, nontender, nondistended, no hepatosplenomegaly or masses, no abdominal bruits or    hernia , no rebound or guarding.   Rectal: not performed Extremities: No lower extremity edema. No clubbing or deformities.  Neuro: Alert and oriented x 4 , grossly normal neurologically.  Skin: Warm and dry, no rash or jaundice.   Psych: Alert and cooperative, normal mood and affect.

## 2018-04-04 NOTE — Assessment & Plan Note (Signed)
62 year old female presenting for first ever colonoscopy.  Given daily narcotic use, plan for deep sedation.  Reports daily BMs.  No constipation.  I have discussed the risks, alternatives, benefits with regards to but not limited to the risk of reaction to medication, bleeding, infection, perforation and the patient is agreeable to proceed. Written consent to be obtained.

## 2018-04-06 ENCOUNTER — Telehealth: Payer: Self-pay

## 2018-04-06 ENCOUNTER — Other Ambulatory Visit: Payer: Self-pay

## 2018-04-06 MED ORDER — PEG 3350-KCL-NA BICARB-NACL 420 G PO SOLR: 4000.0000 mL | ORAL | 0 refills | Status: DC

## 2018-04-06 NOTE — Telephone Encounter (Signed)
Received fax from [facility] is unavailable. Called [facility] they're unable to get Clenpiq at this time. Tried to all pt, but lady said she was sleeping. Asked her to have pt call office.

## 2018-04-06 NOTE — Telephone Encounter (Signed)
Pt called office, offered to send Clenpiq rx to different pharmacy or send Tri-Lyte to [facility]. Pt wanted Tri-Lyte sent to [facility]. Rx sent. New instructions faxed to [facility] and pt is aware.

## 2018-04-12 NOTE — Patient Instructions (Signed)
Brandy Fisher  04/12/2018     @PREFPERIOPPHARMACY @   Your procedure is scheduled on  04/21/2018   Report to [facility] at  830  A.M.  Call this number if you have problems the morning of surgery:  [phone]   Remember:  Do not eat or drink after midnight.  You may drink clear liquids until  ( follow the instructions given to you) .  Clear liquids allowed are:                    Water, Juice (non-citric and without pulp), Carbonated beverages, Clear Tea, Black Coffee only, Plain Jell-O only, Gatorade and Plain Popsicles only    Take these medicines the morning of surgery with A SIP OF WATER  Hydrocodone. Use your inhaler before you come.    Do not wear jewelry, make-up or nail polish.  Do not wear lotions, powders, or perfumes, or deodorant.  Do not shave 48 hours prior to surgery.  Men may shave face and neck.  Do not bring valuables to the hospital.  [facility] is not responsible for any belongings or valuables.  Contacts, dentures or bridgework may not be worn into surgery.  Leave your suitcase in the car.  After surgery it may be brought to your room.  For patients admitted to the hospital, discharge time will be determined by your treatment team.  Patients discharged the day of surgery will not be allowed to drive home.   Name and phone number of your driver:   family Special instructions:  Follow the diet and prep instructions given to you by Dr Roseanne Kaufman office.  Please read over the following fact sheets that you were given. Anesthesia Post-op Instructions and Care and Recovery After Surgery       Colonoscopy, Adult A colonoscopy is an exam to look at the large intestine. It is done to check for problems, such as:  Lumps (tumors).  Growths (polyps).  Swelling (inflammation).  Bleeding.  What happens before the procedure? Eating and drinking Follow instructions from your doctor about eating and drinking. These instructions may  include:  A few days before the procedure - follow a low-fiber diet. ? Avoid nuts. ? Avoid seeds. ? Avoid dried fruit. ? Avoid raw fruits. ? Avoid vegetables.  1-3 days before the procedure - follow a clear liquid diet. Avoid liquids that have red or purple dye. Drink only clear liquids, such as: ? Clear broth or bouillon. ? Black coffee or tea. ? Clear juice. ? Clear soft drinks or sports drinks. ? Gelatin dessert. ? Popsicles.  On the day of the procedure - do not eat or drink anything during the 2 hours before the procedure.  Bowel prep If you were prescribed an oral bowel prep:  Take it as told by your doctor. Starting the day before your procedure, you will need to drink a lot of liquid. The liquid will cause you to poop (have bowel movements) until your poop is almost clear or light green.  If your skin or butt gets irritated from diarrhea, you may: ? Wipe the area with wipes that have medicine in them, such as adult wet wipes with aloe and vitamin E. ? Put something on your skin that soothes the area, such as petroleum jelly.  If you throw up (vomit) while drinking the bowel prep, take a break for up to 60 minutes. Then begin the bowel prep again.  If you keep throwing up and you cannot take the bowel prep without throwing up, call your doctor.  General instructions  Ask your doctor about changing or stopping your normal medicines. This is important if you take diabetes medicines or blood thinners.  Plan to have someone take you home from the hospital or clinic. What happens during the procedure?  An IV tube may be put into one of your veins.  You will be given medicine to help you relax (sedative).  To reduce your risk of infection: ? Your doctors will wash their hands. ? Your anal area will be washed with soap.  You will be asked to lie on your side with your knees bent.  Your doctor will get a long, thin, flexible tube ready. The tube will have a camera and a  light on the end.  The tube will be put into your anus.  The tube will be gently put into your large intestine.  Air will be delivered into your large intestine to keep it open. You may feel some pressure or cramping.  The camera will be used to take photos.  A small tissue sample may be removed from your body to be looked at under a microscope (biopsy). If any possible problems are found, the tissue will be sent to a lab for testing.  If small growths are found, your doctor may remove them and have them checked for cancer.  The tube that was put into your anus will be slowly removed. The procedure may vary among doctors and hospitals. What happens after the procedure?  Your doctor will check on you often until the medicines you were given have worn off.  Do not drive for 24 hours after the procedure.  You may have a small amount of blood in your poop.  You may pass gas.  You may have mild cramps or bloating in your belly (abdomen).  It is up to you to get the results of your procedure. Ask your doctor, or the department performing the procedure, when your results will be ready. This information is not intended to replace advice given to you by your health care provider. Make sure you discuss any questions you have with your health care provider. Document Released: 07/25/2010 Document Revised: 04/22/2016 Document Reviewed: 09/03/2015 Elsevier Interactive Patient Education  2017 [employer]  Colonoscopy, Adult, Care After This sheet gives you information about how to care for yourself after your procedure. Your health care provider may also give you more specific instructions. If you have problems or questions, contact your health care provider. What can I expect after the procedure? After the procedure, it is common to have:  A small amount of blood in your stool for 24 hours after the procedure.  Some gas.  Mild abdominal cramping or bloating.  Follow these  instructions at home: General instructions   For the first 24 hours after the procedure: ? Do not drive or use machinery. ? Do not sign important documents. ? Do not drink alcohol. ? Do your regular daily activities at a slower pace than normal. ? Eat soft, easy-to-digest foods. ? Rest often.  Take over-the-counter or prescription medicines only as told by your health care provider.  It is up to you to get the results of your procedure. Ask your health care provider, or the department performing the procedure, when your results will be ready. Relieving cramping and bloating  Try walking around when you have cramps or feel bloated.  Apply  heat to your abdomen as told by your health care provider. Use a heat source that your health care provider recommends, such as a moist heat pack or a heating pad. ? Place a towel between your skin and the heat source. ? Leave the heat on for 20-30 minutes. ? Remove the heat if your skin turns bright red. This is especially important if you are unable to feel pain, heat, or cold. You may have a greater risk of getting burned. Eating and drinking  Drink enough fluid to keep your urine clear or pale yellow.  Resume your normal diet as instructed by your health care provider. Avoid heavy or fried foods that are hard to digest.  Avoid drinking alcohol for as long as instructed by your health care provider. Contact a health care provider if:  You have blood in your stool 2-3 days after the procedure. Get help right away if:  You have more than a small spotting of blood in your stool.  You pass large blood clots in your stool.  Your abdomen is swollen.  You have nausea or vomiting.  You have a fever.  You have increasing abdominal pain that is not relieved with medicine. This information is not intended to replace advice given to you by your health care provider. Make sure you discuss any questions you have with your health care  provider. Document Released: 02/04/2004 Document Revised: 03/16/2016 Document Reviewed: 09/03/2015 Elsevier Interactive Patient Education  2018 [place] Anesthesia is a term that refers to techniques, procedures, and medicines that help a person stay safe and comfortable during a medical procedure. Monitored anesthesia care, or sedation, is one type of anesthesia. Your anesthesia specialist may recommend sedation if you will be having a procedure that does not require you to be unconscious, such as:  Cataract surgery.  A dental procedure.  A biopsy.  A colonoscopy.  During the procedure, you may receive a medicine to help you relax (sedative). There are three levels of sedation:  Mild sedation. At this level, you may feel awake and relaxed. You will be able to follow directions.  Moderate sedation. At this level, you will be sleepy. You may not remember the procedure.  Deep sedation. At this level, you will be asleep. You will not remember the procedure.  The more medicine you are given, the deeper your level of sedation will be. Depending on how you respond to the procedure, the anesthesia specialist may change your level of sedation or the type of anesthesia to fit your needs. An anesthesia specialist will monitor you closely during the procedure. Let your health care provider know about:  Any allergies you have.  All medicines you are taking, including vitamins, herbs, eye drops, creams, and over-the-counter medicines.  Any use of steroids (by mouth or as a cream).  Any problems you or family members have had with sedatives and anesthetic medicines.  Any blood disorders you have.  Any surgeries you have had.  Any medical conditions you have, such as sleep apnea.  Whether you are pregnant or may be pregnant.  Any use of cigarettes, alcohol, or street drugs. What are the risks? Generally, this is a safe procedure. However, problems may  occur, including:  Getting too much medicine (oversedation).  Nausea.  Allergic reaction to medicines.  Trouble breathing. If this happens, a breathing tube may be used to help with breathing. It will be removed when you are awake and breathing on your own.  Heart trouble.  Lung trouble.  Before the procedure Staying hydrated Follow instructions from your health care provider about hydration, which may include:  Up to 2 hours before the procedure - you may continue to drink clear liquids, such as water, clear fruit juice, black coffee, and plain tea.  Eating and drinking restrictions Follow instructions from your health care provider about eating and drinking, which may include:  8 hours before the procedure - stop eating heavy meals or foods such as meat, fried foods, or fatty foods.  6 hours before the procedure - stop eating light meals or foods, such as toast or cereal.  6 hours before the procedure - stop drinking milk or drinks that contain milk.  2 hours before the procedure - stop drinking clear liquids.  Medicines Ask your health care provider about:  Changing or stopping your regular medicines. This is especially important if you are taking diabetes medicines or blood thinners.  Taking medicines such as aspirin and ibuprofen. These medicines can thin your blood. Do not take these medicines before your procedure if your health care provider instructs you not to.  Tests and exams  You will have a physical exam.  You may have blood tests done to show: ? How well your kidneys and liver are working. ? How well your blood can clot.  General instructions  Plan to have someone take you home from the hospital or clinic.  If you will be going home right after the procedure, plan to have someone with you for 24 hours.  What happens during the procedure?  Your blood pressure, heart rate, breathing, level of pain and overall condition will be monitored.  An IV  tube will be inserted into one of your veins.  Your anesthesia specialist will give you medicines as needed to keep you comfortable during the procedure. This may mean changing the level of sedation.  The procedure will be performed. After the procedure  Your blood pressure, heart rate, breathing rate, and blood oxygen level will be monitored until the medicines you were given have worn off.  Do not drive for 24 hours if you received a sedative.  You may: ? Feel sleepy, clumsy, or nauseous. ? Feel forgetful about what happened after the procedure. ? Have a sore throat if you had a breathing tube during the procedure. ? Vomit. This information is not intended to replace advice given to you by your health care provider. Make sure you discuss any questions you have with your health care provider. Document Released: 03/18/2005 Document Revised: 11/29/2015 Document Reviewed: 10/13/2015 Elsevier Interactive Patient Education  2018 [place], Care After These instructions provide you with information about caring for yourself after your procedure. Your health care provider may also give you more specific instructions. Your treatment has been planned according to current medical practices, but problems sometimes occur. Call your health care provider if you have any problems or questions after your procedure. What can I expect after the procedure? After your procedure, it is common to:  Feel sleepy for several hours.  Feel clumsy and have poor balance for several hours.  Feel forgetful about what happened after the procedure.  Have poor judgment for several hours.  Feel nauseous or vomit.  Have a sore throat if you had a breathing tube during the procedure.  Follow these instructions at home: For at least 24 hours after the procedure:   Do not: ? Participate in activities in which you could fall  or become injured. ? Drive. ? Use heavy  machinery. ? Drink alcohol. ? Take sleeping pills or medicines that cause drowsiness. ? Make important decisions or sign legal documents. ? Take care of children on your own.  Rest. Eating and drinking  Follow the diet that is recommended by your health care provider.  If you vomit, drink water, juice, or soup when you can drink without vomiting.  Make sure you have little or no nausea before eating solid foods. General instructions  Have a responsible adult stay with you until you are awake and alert.  Take over-the-counter and prescription medicines only as told by your health care provider.  If you smoke, do not smoke without supervision.  Keep all follow-up visits as told by your health care provider. This is important. Contact a health care provider if:  You keep feeling nauseous or you keep vomiting.  You feel light-headed.  You develop a rash.  You have a fever. Get help right away if:  You have trouble breathing. This information is not intended to replace advice given to you by your health care provider. Make sure you discuss any questions you have with your health care provider. Document Released: 10/13/2015 Document Revised: 02/12/2016 Document Reviewed: 10/13/2015 Elsevier Interactive Patient Education  [employer].

## 2018-04-14 ENCOUNTER — Other Ambulatory Visit: Payer: Self-pay | Admitting: *Deleted

## 2018-04-14 ENCOUNTER — Telehealth: Payer: Self-pay | Admitting: Internal Medicine

## 2018-04-14 ENCOUNTER — Encounter (HOSPITAL_COMMUNITY)
Admission: RE | Admit: 2018-04-14 | Discharge: 2018-04-14 | Disposition: A | Discharge status: Home / Self Care | Payer: Medicare Other | Source: Ambulatory Visit | Attending: Internal Medicine | Admitting: Internal Medicine

## 2018-04-14 ENCOUNTER — Encounter (HOSPITAL_COMMUNITY): Payer: Self-pay

## 2018-04-14 ENCOUNTER — Telehealth: Payer: Self-pay | Admitting: *Deleted

## 2018-04-14 DIAGNOSIS — Z1211 Encounter for screening for malignant neoplasm of colon: Secondary | ICD-10-CM

## 2018-04-14 MED ORDER — CLENPIQ 10-3.5-12 MG-GM -GM/160ML PO SOLN: 1.0000 | Freq: Once | ORAL | 0 refills | Status: AC

## 2018-04-14 NOTE — Telephone Encounter (Signed)
Received call from [place]. She had VM from patient wanting to cancel pre-op and procedure.   I called patient but pt son gf answered Trenton Gammon (on dpr). Patient was asleep and did not want to wake her up. She states patient does need to cancel procedure on 04/21/18. She does not any funds right now and was unable to pick up medication for prep (trylite) or the dulcolax/enema. She will have patient call to r/s when she is ready. I called Hoyle Sauer and procedure was cancelled. FYI to LSL.

## 2018-04-14 NOTE — Telephone Encounter (Signed)
LMOVM for pt. Pre-op scheduled for 05/19/18 at 11:00am. Letter mailed

## 2018-04-14 NOTE — Telephone Encounter (Signed)
Spoke with patient. She is now r/s'd tp 05/26/18 at 12:15pm. She requests that I send in clenpiq for prep. She does not want trilyte. I have sent clenpiq in. I have mailed new instructions to her. I will call back with pre-op appt.

## 2018-04-14 NOTE — Telephone Encounter (Signed)
noted 

## 2018-04-14 NOTE — Telephone Encounter (Signed)
Pt called to reschedule her pre op and her procedure. [phone]

## 2018-04-19 NOTE — Telephone Encounter (Signed)
Received fax from [facility] is unavailable to send in alternative. Called [facility] and spoke to pharmacist Larene Beach). Clenpiq is unavailable, however, Tri-Lyte that was previously sent in was sold to the pt. (see other phone note). Tried to call pt to clarify re: tcs prep. No answer, LMOVM for return call.

## 2018-04-21 ENCOUNTER — Encounter (HOSPITAL_COMMUNITY): Admission: RE | Payer: Self-pay | Source: Ambulatory Visit

## 2018-04-21 ENCOUNTER — Ambulatory Visit (HOSPITAL_COMMUNITY): Admission: RE | Admit: 2018-04-21 | Payer: Medicare Other | Source: Ambulatory Visit | Admitting: Internal Medicine

## 2018-04-21 SURGERY — COLONOSCOPY WITH PROPOFOL
Anesthesia: Monitor Anesthesia Care

## 2018-04-21 NOTE — Telephone Encounter (Signed)
Noted  

## 2018-04-21 NOTE — Telephone Encounter (Signed)
Spoke with patient. She wanted to cancel TCS scheduled for 05/26/18 at 12:15pm (2nd time cancelling). Patient states she prefers to wait until after all the holidays. She will call back when she is ready to have procedure done. Called carolyn in endo and procedure cancelled.

## 2018-05-19 ENCOUNTER — Other Ambulatory Visit (HOSPITAL_COMMUNITY): Payer: Medicare Other

## 2018-05-26 ENCOUNTER — Ambulatory Visit (HOSPITAL_COMMUNITY): Admit: 2018-05-26 | Payer: Medicare Other | Admitting: Internal Medicine

## 2018-05-26 ENCOUNTER — Encounter (HOSPITAL_COMMUNITY): Payer: Self-pay

## 2018-05-26 SURGERY — COLONOSCOPY WITH PROPOFOL
Anesthesia: Monitor Anesthesia Care

## 2018-07-11 ENCOUNTER — Other Ambulatory Visit (HOSPITAL_COMMUNITY): Payer: Self-pay | Admitting: Family Medicine

## 2018-07-11 ENCOUNTER — Other Ambulatory Visit: Payer: Self-pay | Admitting: Family Medicine

## 2018-07-11 DIAGNOSIS — R221 Localized swelling, mass and lump, neck: Secondary | ICD-10-CM

## 2018-07-21 ENCOUNTER — Ambulatory Visit (HOSPITAL_COMMUNITY)
Admission: RE | Admit: 2018-07-21 | Discharge: 2018-07-21 | Disposition: A | Discharge status: Home / Self Care | Payer: Medicare Other | Source: Ambulatory Visit | Attending: Family Medicine | Admitting: Family Medicine

## 2018-07-21 DIAGNOSIS — R221 Localized swelling, mass and lump, neck: Secondary | ICD-10-CM | POA: Insufficient documentation

## 2018-07-28 ENCOUNTER — Other Ambulatory Visit: Payer: Self-pay

## 2018-07-28 ENCOUNTER — Encounter (HOSPITAL_COMMUNITY): Payer: Self-pay | Admitting: *Deleted

## 2018-07-28 ENCOUNTER — Other Ambulatory Visit: Payer: Self-pay | Admitting: Family Medicine

## 2018-07-28 NOTE — Progress Notes (Addendum)
Ms Brandy Fisher denies chest pain or shortness of breath or chest pain.  Patient reports that she has an irregular heart beat, denies by a cardiologist, or having a  cadric cath, Echo or stress test. Ms Brandy Fisher reports that she is no longer on medication for diabetes.  Patient reports that she checks CBG 2 times a day and that CBG runs in the 120's.  I instructed patient to check CBG after awaking and every 2 hours until arrival  to the hospital.  I Instructed patient if CBG is less than 70 to drink 1/2 cup of a clear juice. Recheck CBG in 15 minutes then call pre- op desk at [phone] for further instructions. Patient repeated instructions to me. Ms Brandy Fisher can't remember her PCP 's last name, she gave me his phone number.  Ms Brandy Fisher reports that she saw PCP a week or so ago.  I called the office, Dr Leanord Asal, office is closed on Thursday.

## 2018-07-29 ENCOUNTER — Ambulatory Visit (HOSPITAL_COMMUNITY): Payer: Medicare Other | Admitting: Certified Registered Nurse Anesthetist

## 2018-07-29 ENCOUNTER — Encounter (HOSPITAL_COMMUNITY): Payer: Self-pay | Admitting: Surgery

## 2018-07-29 ENCOUNTER — Ambulatory Visit (HOSPITAL_COMMUNITY)
Admission: RE | Admit: 2018-07-29 | Discharge: 2018-07-29 | Disposition: A | Discharge status: Home / Self Care | Payer: Medicare Other | Attending: Otolaryngology | Admitting: Otolaryngology

## 2018-07-29 ENCOUNTER — Encounter (HOSPITAL_COMMUNITY)
Admission: RE | Disposition: A | Discharge status: Home / Self Care | Payer: Self-pay | Source: Home / Self Care | Attending: Otolaryngology

## 2018-07-29 DIAGNOSIS — R590 Localized enlarged lymph nodes: Secondary | ICD-10-CM | POA: Insufficient documentation

## 2018-07-29 DIAGNOSIS — R221 Localized swelling, mass and lump, neck: Secondary | ICD-10-CM | POA: Diagnosis present

## 2018-07-29 DIAGNOSIS — E119 Type 2 diabetes mellitus without complications: Secondary | ICD-10-CM | POA: Diagnosis not present

## 2018-07-29 DIAGNOSIS — F17200 Nicotine dependence, unspecified, uncomplicated: Secondary | ICD-10-CM | POA: Insufficient documentation

## 2018-07-29 DIAGNOSIS — C321 Malignant neoplasm of supraglottis: Secondary | ICD-10-CM | POA: Insufficient documentation

## 2018-07-29 DIAGNOSIS — Z794 Long term (current) use of insulin: Secondary | ICD-10-CM | POA: Diagnosis not present

## 2018-07-29 HISTORY — DX: Personal history of urinary calculi: Z87.442

## 2018-07-29 HISTORY — PX: LARYNGOSCOPY AND ESOPHAGOSCOPY: SHX5660

## 2018-07-29 LAB — BASIC METABOLIC PANEL
Anion gap: 9 (ref 5–15)
BUN: 19 mg/dL (ref 8–23)
CO2: 24 mmol/L (ref 22–32)
Calcium: 9.1 mg/dL (ref 8.9–10.3)
Chloride: 104 mmol/L (ref 98–111)
Creatinine, Ser: 0.85 mg/dL (ref 0.44–1.00)
GFR calc Af Amer: >60 mL/min (ref >60)
GFR calc non Af Amer: >60 mL/min (ref >60)
Glucose, Bld: 147 mg/dL — ABNORMAL HIGH (ref 70–99)
Potassium: 4.1 mmol/L (ref 3.5–5.1)
Sodium: 137 mmol/L (ref 135–145)

## 2018-07-29 LAB — GLUCOSE, CAPILLARY
Glucose-Capillary: 142 mg/dL — ABNORMAL HIGH (ref 70–99)
Glucose-Capillary: 141 mg/dL — ABNORMAL HIGH (ref 70–99)

## 2018-07-29 LAB — HEMOGLOBIN A1C
Hgb A1c MFr Bld: 6.6 % — ABNORMAL HIGH (ref 4.8–5.6)
Mean Plasma Glucose: 142.72 mg/dL

## 2018-07-29 LAB — HEMOGLOBIN: Hemoglobin: 13.4 g/dL (ref 12.0–15.0)

## 2018-07-29 SURGERY — LARYNGOSCOPY, WITH ESOPHAGOSCOPY
Anesthesia: General | Site: Mouth

## 2018-07-29 MED ORDER — LIDOCAINE HCL (CARDIAC) PF 100 MG/5ML IV SOSY
PREFILLED_SYRINGE | INTRAVENOUS | Status: DC | PRN
  Administered 2018-07-29: 60 mg via INTRAVENOUS

## 2018-07-29 MED ORDER — 0.9 % SODIUM CHLORIDE (POUR BTL) OPTIME
TOPICAL | Status: DC | PRN
  Administered 2018-07-29: 1000 mL

## 2018-07-29 MED ORDER — OXYCODONE HCL 5 MG PO TABS: 5.0000 mg | ORAL_TABLET | Freq: Once | ORAL | Status: AC | PRN

## 2018-07-29 MED ORDER — OXYCODONE HCL 5 MG/5ML PO SOLN
ORAL | Status: AC
  Filled 2018-07-29: qty 5

## 2018-07-29 MED ORDER — DEXAMETHASONE SODIUM PHOSPHATE 10 MG/ML IJ SOLN
INTRAMUSCULAR | Status: DC | PRN
  Administered 2018-07-29: 5 mg via INTRAVENOUS

## 2018-07-29 MED ORDER — PROMETHAZINE HCL 25 MG/ML IJ SOLN: 6.2500 mg | INTRAMUSCULAR | Status: DC | PRN

## 2018-07-29 MED ORDER — FENTANYL CITRATE (PF) 250 MCG/5ML IJ SOLN
INTRAMUSCULAR | Status: DC | PRN
  Administered 2018-07-29: 100 ug via INTRAVENOUS
  Administered 2018-07-29: 50 ug via INTRAVENOUS

## 2018-07-29 MED ORDER — MIDAZOLAM HCL 2 MG/2ML IJ SOLN
INTRAMUSCULAR | Status: AC
  Filled 2018-07-29: qty 2

## 2018-07-29 MED ORDER — PROPOFOL 10 MG/ML IV BOLUS
INTRAVENOUS | Status: AC
  Filled 2018-07-29: qty 20

## 2018-07-29 MED ORDER — FENTANYL CITRATE (PF) 250 MCG/5ML IJ SOLN
INTRAMUSCULAR | Status: AC
  Filled 2018-07-29: qty 5

## 2018-07-29 MED ORDER — ONDANSETRON HCL 4 MG/2ML IJ SOLN
INTRAMUSCULAR | Status: DC | PRN
  Administered 2018-07-29: 4 mg via INTRAVENOUS

## 2018-07-29 MED ORDER — PROPOFOL 10 MG/ML IV BOLUS
INTRAVENOUS | Status: DC | PRN
  Administered 2018-07-29: 20 mg via INTRAVENOUS
  Administered 2018-07-29: 120 mg via INTRAVENOUS

## 2018-07-29 MED ORDER — LACTATED RINGERS IV SOLN
INTRAVENOUS | Status: DC
  Administered 2018-07-29: 08:00:00 via INTRAVENOUS

## 2018-07-29 MED ORDER — SUCCINYLCHOLINE CHLORIDE 200 MG/10ML IV SOSY
PREFILLED_SYRINGE | INTRAVENOUS | Status: AC
  Filled 2018-07-29: qty 10

## 2018-07-29 MED ORDER — DEXAMETHASONE SODIUM PHOSPHATE 10 MG/ML IJ SOLN
INTRAMUSCULAR | Status: AC
  Filled 2018-07-29: qty 1

## 2018-07-29 MED ORDER — MIDAZOLAM HCL 2 MG/2ML IJ SOLN
INTRAMUSCULAR | Status: DC | PRN
  Administered 2018-07-29: 1 mg via INTRAVENOUS

## 2018-07-29 MED ORDER — SUCCINYLCHOLINE CHLORIDE 20 MG/ML IJ SOLN
INTRAMUSCULAR | Status: DC | PRN
  Administered 2018-07-29: 120 mg via INTRAVENOUS

## 2018-07-29 MED ORDER — HYDROMORPHONE HCL 1 MG/ML IJ SOLN: 0.2500 mg | INTRAMUSCULAR | Status: DC | PRN

## 2018-07-29 MED ORDER — LIDOCAINE-EPINEPHRINE 1 %-1:100000 IJ SOLN
INTRAMUSCULAR | Status: AC
  Filled 2018-07-29: qty 1

## 2018-07-29 MED ORDER — ROCURONIUM BROMIDE 50 MG/5ML IV SOSY
PREFILLED_SYRINGE | INTRAVENOUS | Status: AC
  Filled 2018-07-29: qty 5

## 2018-07-29 MED ORDER — ONDANSETRON HCL 4 MG/2ML IJ SOLN
INTRAMUSCULAR | Status: AC
  Filled 2018-07-29: qty 2

## 2018-07-29 MED ORDER — OXYCODONE HCL 5 MG/5ML PO SOLN
5.0000 mg | Freq: Once | ORAL | Status: AC | PRN
  Administered 2018-07-29: 5 mg via ORAL

## 2018-07-29 MED ORDER — LIDOCAINE 2% (20 MG/ML) 5 ML SYRINGE
INTRAMUSCULAR | Status: AC
  Filled 2018-07-29: qty 5

## 2018-07-29 MED ORDER — EPINEPHRINE HCL (NASAL) 0.1 % NA SOLN
NASAL | Status: DC | PRN
  Administered 2018-07-29: 30 mL via TOPICAL

## 2018-07-29 MED ORDER — EPINEPHRINE HCL (NASAL) 0.1 % NA SOLN
NASAL | Status: AC
  Filled 2018-07-29: qty 30

## 2018-07-29 SURGICAL SUPPLY — 16 items
CANISTER SUCT 3000ML PPV (MISCELLANEOUS) ×2 IMPLANT
COVER BACK TABLE 60X90IN (DRAPES) ×2 IMPLANT
COVER MAYO STAND STRL (DRAPES) ×2 IMPLANT
COVER WAND RF STERILE (DRAPES) ×2 IMPLANT
DRAPE HALF SHEET 40X57 (DRAPES) ×2 IMPLANT
GAUZE 4X4 16PLY RFD (DISPOSABLE) IMPLANT
GLOVE ECLIPSE 7.5 STRL STRAW (GLOVE) ×2 IMPLANT
GUARD TEETH (MISCELLANEOUS) ×1 IMPLANT
KIT BASIN OR (CUSTOM PROCEDURE TRAY) ×2 IMPLANT
KIT TURNOVER KIT B (KITS) ×2 IMPLANT
NS IRRIG 1000ML POUR BTL (IV SOLUTION) ×2 IMPLANT
PAD ARMBOARD 7.5X6 YLW CONV (MISCELLANEOUS) ×4 IMPLANT
PATTIES SURGICAL .5 X3 (DISPOSABLE) ×1 IMPLANT
SOLUTION ANTI FOG 6CC (MISCELLANEOUS) ×1 IMPLANT
TOWEL OR 17X26 10 PK STRL BLUE (TOWEL DISPOSABLE) ×2 IMPLANT
TUBE CONNECTING 12X1/4 (SUCTIONS) ×2 IMPLANT

## 2018-07-29 NOTE — Op Note (Signed)
OPERATIVE REPORT  DATE OF SURGERY: 07/29/2018  PATIENT:  Brandy Fisher,  63 y.o. female  PRE-OPERATIVE DIAGNOSIS:  Cervical Lymphadenopathy, laryngeal mass  POST-OPERATIVE DIAGNOSIS:  Cervical Lymphadenopathy, laryngeal mass  PROCEDURE:  Procedure(s): DIRECT LARYNGOSCOPY AND ESOPHAGOSCOPY WITH BIOPSY  SURGEON:  Beckie Salts, MD  ASSISTANTS: none  ANESTHESIA:   General   EBL: 5 ml  DRAINS: none  LOCAL MEDICATIONS USED:  None  SPECIMEN: Left supraglottic mass  COUNTS:  Correct  PROCEDURE DETAILS: The patient was taken to the operating room and placed on the operating table in the supine position. Following induction of general endotracheal anesthesia, the table was turned 90 and the patient was draped in a standard fashion.  Patient was edentulous.  Esophagoscopy was performed using a rigid cervical esophagoscope.  This was passed through the esophageal introitus and as far as it would pass and there was no evidence of obstruction.  The scope was then withdrawn and secretions were suctioned.  The esophageal wall was inspected there were no mucosal lesions identified.  The Jako laryngoscope was then used to evaluate the hypopharynx and larynx.  Cords and cells looked healthy.  Subglottis was normal.  There is a large exophytic mass arising off the left posterior area epiglottic fold.  It measured approximately 2-1/2 cm and was round and smooth and firm.  It did not extend into the piriform sinus.  Multiple large biopsies were taken from this.  The center of this was solid as well.  Topical adrenaline was used on pledgets for hemostasis.  No other lesions were identified.  The patient was awakened extubated and transferred to recovery in stable condition.    PATIENT DISPOSITION:  To PACU, stable

## 2018-07-29 NOTE — Discharge Instructions (Signed)
Dr. Constance Holster will call you with results in a few days.

## 2018-07-29 NOTE — H&P (Signed)
HPI:   Brandy Fisher is a 63 y.o. female who presents as a consult Patient.   Referring Provider: Phylis Bougie,*  Chief complaint: Neck mass.  HPI: About 6 months ago she noticed a lump on the left side of her neck. It had come and gone multiple times and over the past 2 or 3 months has remained in place. It does not cause her any pain or discomfort. She has not noticed a change in her voice or breathing but she has been having some trouble swallowing and sometimes gets choked on her food. She has lost about 150 pounds over the past few years, which has all been on purpose. She has pain in the left ear but only when the ear is instrumented.  PMH/Meds/All/SocHx/FamHx/ROS:   Past Medical History:  Diagnosis Date  . Diabetes mellitus ([place])   Past Surgical History:  Procedure Laterality Date  . HIP SURGERY   No family history of bleeding disorders, wound healing problems or difficulty with anesthesia.   Social History   Socioeconomic History  . Marital status: Unknown  Spouse name: Not on file  . Number of children: Not on file  . Years of education: Not on file  . Highest education level: Not on file  Occupational History  . Not on file  Social Needs  . Financial resource strain: Not on file  . Food insecurity:  Worry: Not on file  Inability: Not on file  . Transportation needs:  Medical: Not on file  Non-medical: Not on file  Tobacco Use  . Smoking status: Current Every Day Smoker  . Smokeless tobacco: Never Used  Substance and Sexual Activity  . Alcohol use: Not on file  . Drug use: Not on file  . Sexual activity: Not on file  Lifestyle  . Physical activity:  Days per week: Not on file  Minutes per session: Not on file  . Stress: Not on file  Relationships  . Social connections:  Talks on phone: Not on file  Gets together: Not on file  Attends religious service: Not on file  Active member of club or organization: Not on file  Attends meetings of  clubs or organizations: Not on file  Relationship status: Not on file  Other Topics Concern  . Not on file  Social History Narrative  . Not on file   Current Outpatient Medications:  . cephalexin (KEFLEX) 500 MG capsule, TK 1 C PO QID FOR INFECTION, Disp: , Rfl:  . HYDROcodone-acetaminophen (NORCO) 7.5-325 mg per tablet, TK 1 T PO QAM AND 1 T PO MID AFTERNOON AND 2 TS PO QHS FOR SEVERE PAIN, Disp: , Rfl:   A complete ROS was performed with pertinent positives/negatives noted in the HPI. The remainder of the ROS are negative.   Physical Exam:   Ht 1.702 m (5\' 7" )  Wt 74.4 kg (164 lb)  BMI 25.69 kg/m   General: Healthy and alert, in no distress, breathing easily. Normal affect. In a pleasant mood. Head: Normocephalic, atraumatic. No masses, or scars. Eyes: Pupils are equal, and reactive to light. Vision is grossly intact. No spontaneous or gaze nystagmus. Ears: Ear canals are clear. Tympanic membranes are intact, with normal landmarks and the middle ears are clear and healthy. Hearing: Grossly normal. Nose: Nasal cavities are clear with healthy mucosa, no polyps or exudate. Airways are patent. Face: No masses or scars, facial nerve function is symmetric. Oral Cavity: No mucosal abnormalities are noted. Tongue with normal mobility. She is edentulous.  Oropharynx: There are no mucosal masses identified. Tongue base appears normal and healthy. Larynx/Hypopharynx: Excessive gag reflex prevents adequate view. Chest: Deferred Neck: There are hard enlarged lymph nodes in the left neck, levels 4 and 5. The largest one is just above the clavicle and is approximately 7 cm in diameter, no right side lymph nodes palpable, no thyroid nodules or enlargement. Neuro: Cranial nerves II-XII with normal function. Balance: Normal gate. Other findings: none.  Independent Review of Additional Tests or Records:  none  Procedures:  Procedure note: Flexible fiberoptic laryngoscopy  Details of the  procedure were explained to the patient and all questions were answered.   Procedure:   After anesthetizing the nasal cavity with topical lidocaine and oxymetazoline, the flexible endoscope was introduced and passed through the nasal cavity into the nasopharynx. The scope was then advanced to the level of the oropharynx, then the hypopharynx and larynx. Scope number 12 was used.  Findings:   The posterior soft palate, uvula, tongue base and vallecula were visualized and appeared healthy without mucosal masses or lesions. The supraglottic larynx is abnormal with a large bulky smooth mucosal covered mass arising from what appears to be the left AE fold. It partially obscures the airway but with positioning change, I am able to visualize the cords and they do seem clear and they do seem to move symmetrically. I am unable to visualize the left piriform sinus so I cannot ascertain the extent there. The anterior epiglottis and vallecula appear to be clear..   Additional findings: None  The scope was withdrawn from the nose. She tolerated the procedure well.   Impression & Plans:  Supraglottic mass with associated lymphadenopathy. 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 Recommend direct laryngoscopy with biopsies for tumor mapping and staging purposes. Following that we will determine the necessary treatment recommendations. Strongly urged her and her family member to consider smoking cessation. I explained to her there is a very small chance that she may require emergent tracheostomy during the procedure and possibly even before that if her airway starts to be compromised. She should come immediately to the emergency room if she starts having trouble breathing.

## 2018-07-29 NOTE — Anesthesia Procedure Notes (Signed)
Procedure Name: Intubation Date/Time: 07/29/2018 9:45 AM Performed by: Raenette Rover, CRNA Pre-anesthesia Checklist: Patient identified, Emergency Drugs available, Suction available and Patient being monitored Patient Re-evaluated:Patient Re-evaluated prior to induction Oxygen Delivery Method: Circle system utilized Preoxygenation: Pre-oxygenation with 100% oxygen Induction Type: IV induction Ventilation: Mask ventilation without difficulty Laryngoscope Size: Glidescope and 3 Grade View: Grade I Tube type: Oral Tube size: 6.5 mm Number of attempts: 2 Airway Equipment and Method: Video-laryngoscopy Placement Confirmation: ETT inserted through vocal cords under direct vision,  positive ETCO2,  CO2 detector and breath sounds checked- equal and bilateral Secured at: 21 cm Tube secured with: Tape Dental Injury: Teeth and Oropharynx as per pre-operative assessment  Difficulty Due To: Difficulty was anticipated Comments: Patient with very large mass near left side of vocal cords. This made visualization very difficult.

## 2018-07-29 NOTE — Anesthesia Postprocedure Evaluation (Signed)
Anesthesia Post Note  Patient: BERMA HARTS  Procedure(s) Performed: DIRECT LARYNGOSCOPY AND ESOPHAGOSCOPY WITH BIOPSY (N/A Mouth)     Patient location during evaluation: PACU Anesthesia Type: General Level of consciousness: awake and alert Pain management: pain level controlled Vital Signs Assessment: post-procedure vital signs reviewed and stable Respiratory status: spontaneous breathing, nonlabored ventilation and respiratory function stable Cardiovascular status: blood pressure returned to baseline and stable Postop Assessment: no apparent nausea or vomiting Anesthetic complications: no    Last Vitals:  Vitals:   07/29/18 1051 07/29/18 1119  BP: (!) 145/58 (!) 141/61  Pulse: 71 70  Resp: 13 14  Temp: 36.5 C 36.5 C  SpO2: 96% (!) 89%    Last Pain:  Vitals:   07/29/18 1054  PainSc: 3                  Lynda Rainwater

## 2018-07-29 NOTE — Transfer of Care (Signed)
Immediate Anesthesia Transfer of Care Note  Patient: Brandy Fisher  Procedure(s) Performed: DIRECT LARYNGOSCOPY AND ESOPHAGOSCOPY WITH BIOPSY (N/A Mouth)  Patient Location: PACU  Anesthesia Type:General  Level of Consciousness: awake, alert , oriented and patient cooperative  Airway & Oxygen Therapy: Patient Spontanous Breathing and Patient connected to face mask oxygen  Post-op Assessment: Report given to RN and Post -op Vital signs reviewed and stable  Post vital signs: Reviewed and stable  Last Vitals:  Vitals Value Taken Time  BP 172/65 07/29/2018 10:12 AM  Temp    Pulse 97 07/29/2018 10:14 AM  Resp 14 07/29/2018 10:14 AM  SpO2 100 % 07/29/2018 10:14 AM  Vitals shown include unvalidated device data.  Last Pain:  Vitals:   07/29/18 0811  PainSc: 0-No pain      Patients Stated Pain Goal: 3 (25/75/05 1833)  Complications: No apparent anesthesia complications

## 2018-07-29 NOTE — Anesthesia Preprocedure Evaluation (Signed)
Anesthesia Evaluation  Patient identified by MRN, date of birth, ID band Patient awake    Reviewed: Allergy & Precautions, NPO status , Patient's Chart, lab work & pertinent test results  Airway Mallampati: II  TM Distance: >3 FB Neck ROM: Full    Dental no notable dental hx.    Pulmonary Current Smoker,    Pulmonary exam normal breath sounds clear to auscultation       Cardiovascular negative cardio ROS Normal cardiovascular exam Rhythm:Regular Rate:Normal     Neuro/Psych negative neurological ROS  negative psych ROS   GI/Hepatic negative GI ROS, Neg liver ROS,   Endo/Other  diabetes, Insulin Dependent  Renal/GU negative Renal ROS  negative genitourinary   Musculoskeletal  (+) Arthritis , Osteoarthritis,    Abdominal   Peds negative pediatric ROS (+)  Hematology negative hematology ROS (+)   Anesthesia Other Findings   Reproductive/Obstetrics negative OB ROS                             Anesthesia Physical  Anesthesia Plan  ASA: III  Anesthesia Plan: General   Post-op Pain Management:    Induction: Intravenous  PONV Risk Score and Plan: 2 and Ondansetron and Midazolam  Airway Management Planned: Oral ETT  Additional Equipment:   Intra-op Plan:   Post-operative Plan: Extubation in OR  Informed Consent: I have reviewed the patients History and Physical, chart, labs and discussed the procedure including the risks, benefits and alternatives for the proposed anesthesia with the patient or authorized representative who has indicated his/her understanding and acceptance.     Dental advisory given  Plan Discussed with: CRNA and Surgeon  Anesthesia Plan Comments:         Anesthesia Quick Evaluation

## 2018-07-30 ENCOUNTER — Encounter (HOSPITAL_COMMUNITY): Payer: Self-pay | Admitting: Otolaryngology

## 2018-08-04 ENCOUNTER — Other Ambulatory Visit (HOSPITAL_COMMUNITY): Payer: Self-pay | Admitting: Otolaryngology

## 2018-08-04 DIAGNOSIS — C329 Malignant neoplasm of larynx, unspecified: Secondary | ICD-10-CM

## 2018-08-04 DIAGNOSIS — J387 Other diseases of larynx: Secondary | ICD-10-CM

## 2018-08-22 ENCOUNTER — Encounter (HOSPITAL_COMMUNITY)
Admission: RE | Admit: 2018-08-22 | Discharge: 2018-08-22 | Disposition: A | Discharge status: Home / Self Care | Payer: Medicare Other | Source: Ambulatory Visit | Attending: Otolaryngology | Admitting: Otolaryngology

## 2018-08-22 DIAGNOSIS — C329 Malignant neoplasm of larynx, unspecified: Secondary | ICD-10-CM | POA: Diagnosis present

## 2018-08-22 DIAGNOSIS — J387 Other diseases of larynx: Secondary | ICD-10-CM

## 2018-08-22 MED ORDER — FLUDEOXYGLUCOSE F - 18 (FDG) INJECTION
10.3500 | Freq: Once | INTRAVENOUS | Status: AC | PRN
  Administered 2018-08-22: 10.35 via INTRAVENOUS

## 2018-08-24 NOTE — Progress Notes (Signed)
error 

## 2018-08-29 ENCOUNTER — Telehealth: Payer: Self-pay | Admitting: *Deleted

## 2018-08-29 NOTE — Telephone Encounter (Signed)
Oncology Nurse Navigator Documentation  Placed introductory call to new referral patient Brandy Fisher.  Introduced myself as the H&N oncology nurse navigator that works with Dr. Isidore Moos to whom she has been referred by Dr. Constance Holster.  She confirmed understanding of referral and appt date/time of 3/4 2:00.  Asked her if she would prefer consults/treatments in closer proximity to her home in [place].  She acknowledged she has to rely on her son for transportation and his schedule and cost of travel to [place] will be a burden.  I noted she can be referred to [facility] in [place] if she wishes.  She agreed, expressed appreciation.  I provided her my contact information, encouraged her to call me with questions/concerns during transition of her care.  Intervention Called ENT Dr. Janeice Robinson office, LVMM noting above, asked for call-back to facilitate referrals to [facility].  Gayleen Orem, RN, BSN Head & Neck Oncology Nurse [place] at [place] [phone]

## 2018-08-30 ENCOUNTER — Ambulatory Visit
Admission: RE | Admit: 2018-08-30 | Discharge: 2018-08-30 | Disposition: A | Discharge status: Home / Self Care | Payer: Medicare Other | Source: Ambulatory Visit | Attending: Radiation Oncology | Admitting: Radiation Oncology

## 2018-08-30 ENCOUNTER — Ambulatory Visit: Payer: Medicare Other

## 2018-09-02 NOTE — Progress Notes (Signed)
error 

## 2018-09-05 ENCOUNTER — Telehealth: Payer: Self-pay | Admitting: *Deleted

## 2018-09-05 NOTE — Telephone Encounter (Signed)
Oncology Nurse Navigator Documentation  Spoke with [name], [name] at [facility].  She confirmed Brandy Fisher's referral from ENT Constance Holster received, she has appt this Friday 1:30 with Dr. Norton Pastel, Medical Oncology.  Gayleen Orem, RN, BSN Head & Neck Oncology Nurse [place] at [place] [phone]

## 2018-09-07 ENCOUNTER — Ambulatory Visit
Admission: RE | Admit: 2018-09-07 | Discharge: 2018-09-07 | Disposition: A | Discharge status: Home / Self Care | Payer: Medicare Other | Source: Ambulatory Visit | Attending: Radiation Oncology | Admitting: Radiation Oncology

## 2018-10-02 ENCOUNTER — Emergency Department (HOSPITAL_COMMUNITY)
Admission: EM | Admit: 2018-10-02 | Discharge: 2018-10-03 | Disposition: A | Discharge status: Home / Self Care | Payer: Medicare Other | Attending: Emergency Medicine | Admitting: Emergency Medicine

## 2018-10-02 ENCOUNTER — Other Ambulatory Visit: Payer: Self-pay

## 2018-10-02 ENCOUNTER — Encounter (HOSPITAL_COMMUNITY): Payer: Self-pay | Admitting: Emergency Medicine

## 2018-10-02 DIAGNOSIS — Z85818 Personal history of malignant neoplasm of other sites of lip, oral cavity, and pharynx: Secondary | ICD-10-CM | POA: Diagnosis not present

## 2018-10-02 DIAGNOSIS — K5903 Drug induced constipation: Secondary | ICD-10-CM | POA: Insufficient documentation

## 2018-10-02 DIAGNOSIS — E119 Type 2 diabetes mellitus without complications: Secondary | ICD-10-CM | POA: Insufficient documentation

## 2018-10-02 DIAGNOSIS — F17210 Nicotine dependence, cigarettes, uncomplicated: Secondary | ICD-10-CM | POA: Insufficient documentation

## 2018-10-02 DIAGNOSIS — K5900 Constipation, unspecified: Secondary | ICD-10-CM | POA: Diagnosis present

## 2018-10-02 HISTORY — DX: Malignant (primary) neoplasm, unspecified: C80.1

## 2018-10-02 NOTE — ED Triage Notes (Addendum)
Pt C/O constipation that began last night. Pt states last normal BM was 2 1/2 weeks ago. Pt has taken 1 7.5/325 hydrocodone prior to arrival.

## 2018-10-02 NOTE — ED Provider Notes (Signed)
[facility] EMERGENCY DEPARTMENT Provider Note   CSN: [id number] Arrival date & time: 10/02/18  2319  Time seen 11:48 PM  History   Chief Complaint Chief Complaint  Patient presents with  . Constipation    HPI Brandy Fisher is a 63 y.o. female.     HPI patient states she has been on pain pills for 2 to 4 years, they are written by Dr. Karie Kirks.  She states she had back surgery and a partial hip surgery that is painful.  She states about 5 to 6 months ago Dr. Karie Kirks diagnosed her with throat cancer.  She has had a feeding tube placed and a Port-A-Cath.  She is to start getting 6 chemotherapy and radiation therapy soon in [place].  She states they are not doing surgery on her.  She states she was smoking a pack a day.  She states she has been constipated off and on for the past 3 weeks.  She states her stools are hard, sometimes she has balls, sometimes her stools soft.  She denies any abdominal pain, loss of appetite, nausea, vomiting, or fever.  Nurses report she had a BM shortly after arrival in the ED.  When asked why she did not talk to her doctor about her constipation she states "He's in [place]".  [place] is only 15 minutes down the road, plus she states she sees Dr. Karie Kirks which is in this town.  Patient states "I ain't got no money for any medication".  PCP Carlis Abbott, NP PCP Dr Karie Kirks  Past Medical History:  Diagnosis Date  . Arthritis   . Cancer ([place])    Throat cancer  . Cough    GREEN SPUTUM   . Diabetes mellitus    Type II  . History of kidney stones    passed  . Osteomyelitis of foot, left, acute ([place]) 11/09/2013    Patient Active Problem List   Diagnosis Date Noted  . Encounter for screening colonoscopy 04/04/2018  . Chronic narcotic use 04/04/2018  . S/P lumbar laminectomy 05/16/2015  . Osteomyelitis of foot, left, acute ([place]) 11/09/2013  . Necrotic toes ([place]) 11/08/2013  . Uncontrolled diabetes mellitus type 2 without complications ([place]) 58/85/0277   . Cellulitis of second toe of left foot 11/08/2013  . Hyperglycemia 11/08/2013  . Tobacco abuse 11/08/2013  . Cellulitis of toe of left foot 11/08/2013  . Hip fracture, right ([place]) 03/18/2011  . Diabetes mellitus 03/18/2011    Past Surgical History:  Procedure Laterality Date  . BREAST SURGERY Left    Left breast biopsy, negative  . CESAREAN SECTION    . CHOLECYSTECTOMY    . HIP ARTHROPLASTY  03/16/2011   Procedure: Right ARTHROPLASTY BIPOLAR HIP;  Surgeon: Sanjuana Kava;  Location: [facility];  Service: Orthopedics;  Laterality: Right;  . LARYNGOSCOPY AND ESOPHAGOSCOPY N/A 07/29/2018   Procedure: DIRECT LARYNGOSCOPY AND ESOPHAGOSCOPY WITH BIOPSY;  Surgeon: Izora Gala, MD;  Location: [place];  Service: ENT;  Laterality: N/A;  . LUMBAR LAMINECTOMY/DECOMPRESSION MICRODISCECTOMY Left 05/16/2015   Procedure: Laminectomy and Foraminotomy - L4-L5 - left, resection of synovial cyst;  Surgeon: Eustace Moore, MD;  Location: [place] [facility];  Service: Neurosurgery;  Laterality: Left;  Laminectomy and Foraminotomy - L4-L5 - left, resection of synovial cyst     OB History   No obstetric history on file.      Home Medications    Prior to Admission medications   Medication Sig Start Date End Date Taking? Authorizing Provider  acetaminophen (  TYLENOL) 500 MG tablet Take 1,500 mg by mouth every 6 (six) hours as needed for moderate pain or headache.    [provider]  albuterol (PROVENTIL HFA;VENTOLIN HFA) 108 (90 Base) MCG/ACT inhaler Inhale 2 puffs into the lungs every 6 (six) hours as needed for wheezing or shortness of breath.     [provider]  cephALEXin (KEFLEX) 500 MG capsule Take 500 mg by mouth 4 (four) times daily. 07/08/18   [provider]  HYDROcodone-acetaminophen (NORCO) 7.5-325 MG tablet Take 1 tablet by mouth 4 (four) times daily as needed (pain.).  04/03/18   [provider]  polyethylene glycol-electrolytes (TRILYTE) 420 g solution Take 4,000 mLs by  mouth as directed. 04/06/18   Rourk, Cristopher Estimable, MD    Family History Family History  Problem Relation Age of Onset  . Diabetes Mother   . Colon cancer Neg Hx     Social History Social History   Tobacco Use  . Smoking status: Current Every Day Smoker    Packs/day: 0.50    Years: 25.00    Pack years: 12.50    Types: Cigarettes  . Smokeless tobacco: Never Used  . Tobacco comment: "going to try to quit"  Substance Use Topics  . Alcohol use: No  . Drug use: No  on disability "I don't read good"  Allergies   Morphine and related and Tramadol   Review of Systems Review of Systems  All other systems reviewed and are negative.    Physical Exam Updated Vital Signs BP 136/73 (BP Location: Left Arm)   Pulse (!) 103   Temp 99.3 F (37.4 C) (Oral)   Resp 19   SpO2 95%   Vital signs normal    Physical Exam Vitals signs and nursing note reviewed.  Constitutional:      Appearance: Normal appearance. She is obese.  HENT:     Head: Normocephalic and atraumatic.     Nose: Nose normal.     Mouth/Throat:     Mouth: Mucous membranes are moist.     Comments: Edentulous Eyes:     Extraocular Movements: Extraocular movements intact.     Conjunctiva/sclera: Conjunctivae normal.     Pupils: Pupils are equal, round, and reactive to light.  Neck:     Comments: Patient appears to have some swelling in her left proximal neck. Cardiovascular:     Rate and Rhythm: Normal rate and regular rhythm.     Pulses: Normal pulses.     Heart sounds: No murmur.     Comments: Patient has a Port-A-Cath in her right chest Pulmonary:     Effort: Pulmonary effort is normal. No respiratory distress.     Breath sounds: Normal breath sounds.  Abdominal:     General: Abdomen is flat. Bowel sounds are normal.     Palpations: Abdomen is soft.     Tenderness: There is no abdominal tenderness.     Comments: Patient has a feeding tube in place  Musculoskeletal: Normal range of motion.  Skin:     General: Skin is warm and dry.  Neurological:     General: No focal deficit present.     Mental Status: She is alert and oriented to person, place, and time.     Cranial Nerves: No cranial nerve deficit.  Psychiatric:        Mood and Affect: Mood normal.        Behavior: Behavior normal.        Thought Content: Thought  content normal.      ED Treatments / Results  Labs (all labs ordered are listed, but only abnormal results are displayed) Labs Reviewed - No data to display  EKG None  Radiology Dg Abdomen 1 View  Result Date: 10/03/2018 CLINICAL DATA:  Constipation.  Recent feeding tube placement EXAM: ABDOMEN - 1 VIEW COMPARISON:  None. FINDINGS: There is a feeding tube which projects over the left hemiabdomen. No abnormal small bowel dilatation. Moderate stool burden identified within the right colon and rectum. There is osteoarthritis involving the left hip. Previous right hip arthroplasty. IMPRESSION: 1. Status post left gastrostomy tube placement. 2. Moderate stool burden within the right colon and rectum which may be secondary to constipation. Electronically Signed   By: Kerby Moors M.D.   On: 10/03/2018 00:33    Procedures Procedures (including critical care time)  Medications Ordered in ED Medications - No data to display   Initial Impression / Assessment and Plan / ED Course  I have reviewed the triage vital signs and the nursing notes.  Pertinent labs & imaging results that were available during my care of the patient were reviewed by me and considered in my medical decision making (see chart for details).      Patient presents with constipation for the past 3 weeks with history of being on narcotic pain medicine for several years.  She had recent surgery for a feeding tube in anticipation of starting chemo and radiation therapy for throat cancer.  Therefore a 1 view abdominal x-ray was done to see if she needs an enema or she just needs oral medications for her  constipation.  Review the [place] shows patient gets #120 hydrocodone 7.5/325 tablets monthly for at least the past 2 years, last filled February 29.  These were all prescribed by Dr. Karie Kirks.  I have reviewed her x-ray which has a lot of stool in the proximal ascending colon and in the distal descending colon and in the sigmoid colon and rectal area.  I do not see anything that looks worrisome for obstruction.  Patient continues Dulcolax suppositories, she will also be taught how to use MiraLAX.  Radiologist report agrees before I had seen.  Patient was discharged home.  Final Clinical Impressions(s) / ED Diagnoses   Final diagnoses:  Drug-induced constipation    ED Discharge Orders    None    OTC dulcolax and miralax  Plan discharge  Rolland Porter, MD, Barbette Or, MD 10/03/18 [phone]

## 2018-10-03 ENCOUNTER — Emergency Department (HOSPITAL_COMMUNITY): Payer: Medicare Other

## 2018-10-03 DIAGNOSIS — K5903 Drug induced constipation: Secondary | ICD-10-CM | POA: Diagnosis not present

## 2018-10-03 NOTE — Discharge Instructions (Addendum)
Put dulcolax suppositories in your rectum every day for the next 3-4 days or nights. Get miralax and put one dose or 17 g in 8 ounces of water,  take 1 dose every 30 minutes for 2-3 hours or until you  get good results and then once or twice daily to prevent constipation. Recheck if you get abdominal pain or vomiting.

## 2018-10-17 LAB — LAB REPORT - SCANNED: EGFR (Non-African Amer.): 59

## 2019-02-02 ENCOUNTER — Other Ambulatory Visit (HOSPITAL_COMMUNITY): Payer: Self-pay | Admitting: Radiation Oncology

## 2019-02-02 DIAGNOSIS — C138 Malignant neoplasm of overlapping sites of hypopharynx: Secondary | ICD-10-CM

## 2019-02-20 ENCOUNTER — Encounter (HOSPITAL_COMMUNITY)
Admission: RE | Admit: 2019-02-20 | Discharge: 2019-02-20 | Disposition: A | Discharge status: Home / Self Care | Payer: Medicare Other | Source: Ambulatory Visit | Attending: Radiation Oncology | Admitting: Radiation Oncology

## 2019-02-20 ENCOUNTER — Other Ambulatory Visit: Payer: Self-pay

## 2019-02-20 DIAGNOSIS — C138 Malignant neoplasm of overlapping sites of hypopharynx: Secondary | ICD-10-CM

## 2019-02-20 MED ORDER — FLUDEOXYGLUCOSE F - 18 (FDG) INJECTION
7.4600 | Freq: Once | INTRAVENOUS | Status: AC | PRN
  Administered 2019-02-20: 7.46 via INTRAVENOUS

## 2019-06-12 ENCOUNTER — Other Ambulatory Visit (HOSPITAL_COMMUNITY): Payer: Self-pay | Admitting: Radiation Oncology

## 2019-06-12 DIAGNOSIS — J387 Other diseases of larynx: Secondary | ICD-10-CM

## 2019-07-10 ENCOUNTER — Other Ambulatory Visit: Payer: Self-pay

## 2019-07-10 ENCOUNTER — Ambulatory Visit (HOSPITAL_COMMUNITY)
Admission: RE | Admit: 2019-07-10 | Discharge: 2019-07-10 | Disposition: A | Discharge status: Home / Self Care | Payer: Medicare Other | Source: Ambulatory Visit | Attending: Radiation Oncology | Admitting: Radiation Oncology

## 2019-07-10 DIAGNOSIS — J387 Other diseases of larynx: Secondary | ICD-10-CM

## 2019-07-10 MED ORDER — FLUDEOXYGLUCOSE F - 18 (FDG) INJECTION
8.2900 | Freq: Once | INTRAVENOUS | Status: AC | PRN
  Administered 2019-07-10: 8.29 via INTRAVENOUS

## 2019-07-31 ENCOUNTER — Encounter (HOSPITAL_COMMUNITY): Payer: Self-pay | Admitting: Emergency Medicine

## 2019-07-31 ENCOUNTER — Other Ambulatory Visit: Payer: Self-pay

## 2019-07-31 ENCOUNTER — Emergency Department (HOSPITAL_COMMUNITY)
Admission: EM | Admit: 2019-07-31 | Discharge: 2019-07-31 | Disposition: A | Discharge status: Home / Self Care | Payer: Medicare Other | Attending: Emergency Medicine | Admitting: Emergency Medicine

## 2019-07-31 DIAGNOSIS — R21 Rash and other nonspecific skin eruption: Secondary | ICD-10-CM | POA: Insufficient documentation

## 2019-07-31 DIAGNOSIS — Z79899 Other long term (current) drug therapy: Secondary | ICD-10-CM | POA: Insufficient documentation

## 2019-07-31 DIAGNOSIS — E119 Type 2 diabetes mellitus without complications: Secondary | ICD-10-CM | POA: Insufficient documentation

## 2019-07-31 DIAGNOSIS — Z85818 Personal history of malignant neoplasm of other sites of lip, oral cavity, and pharynx: Secondary | ICD-10-CM | POA: Insufficient documentation

## 2019-07-31 DIAGNOSIS — F17210 Nicotine dependence, cigarettes, uncomplicated: Secondary | ICD-10-CM | POA: Diagnosis not present

## 2019-07-31 MED ORDER — PREDNISONE 10 MG (21) PO TBPK: ORAL_TABLET | Freq: Every day | ORAL | 0 refills | Status: DC

## 2019-07-31 MED ORDER — PREDNISONE 50 MG PO TABS
60.0000 mg | ORAL_TABLET | Freq: Once | ORAL | Status: AC
  Administered 2019-07-31: 60 mg via ORAL
  Filled 2019-07-31: qty 1

## 2019-07-31 MED ORDER — VALACYCLOVIR HCL 1 G PO TABS: 1000.0000 mg | ORAL_TABLET | Freq: Three times a day (TID) | ORAL | 0 refills | Status: AC

## 2019-07-31 MED ORDER — VALACYCLOVIR HCL 500 MG PO TABS
1000.0000 mg | ORAL_TABLET | Freq: Once | ORAL | Status: AC
  Administered 2019-07-31: 21:00:00 1000 mg via ORAL
  Filled 2019-07-31: qty 2

## 2019-07-31 NOTE — Discharge Instructions (Signed)
I suspect your rash is due to shingles.  Start taking valacyclovir 1 g 3 times daily for 10 days.  Start taking prednisone as prescribed beginning tomorrow as you received the first dose in the emergency department today.  Do not take any ibuprofen, Advil, Aleve or Motrin while taking this medication.  You can however take 500 to 1000 mg of Tylenol every 6 hours as needed for pain additionally.  Do not exceed 4000 mg of Tylenol daily.  Avoid touching or scratching at the area.  Please follow-up with your primary care provider as scheduled tomorrow for reevaluation.  Return to the emergency department if any concerning signs or symptoms develop such as high fevers, neck stiffness, difficulty breathing or swallowing, eye pain or vision changes, ear pain or hearing loss.

## 2019-07-31 NOTE — ED Provider Notes (Signed)
[facility] EMERGENCY DEPARTMENT Provider Note   CSN: [id number] Arrival date & time: 07/31/19  1954     History Chief Complaint  Patient presents with  . Rash    Brandy Fisher is a 64 y.o. female with history of neoplasm of the hypopharynx currently in remission, type 2 diabetes mellitus, nephrolithiasis, osteomyelitis, tobacco abuse presenting for evaluation of acute onset, persistent rash for 6 days.  Reports the rash is minimally pruritic but somewhat painful causing headaches.  It is localized to the left side of the neck extending into the face and ear.  She denies ear pain, abnormal drainage from the ear, eye pain or vision changes.  Denies fevers, facial swelling, difficulty breathing or swallowing, wheezing, shortness of breath or chest pain.  She has not had any new soaps, conditioners, detergents, or lotions.  No insect bites.  She has applied a cream that she received previously for rash related to radiation with no improvement.  She has not tried anything else for her symptoms.  She has an appointment to see her PCP tomorrow.  The history is provided by the patient.       Past Medical History:  Diagnosis Date  . Arthritis   . Cancer ([place])    Throat cancer  . Cough    GREEN SPUTUM   . Diabetes mellitus    Type II  . History of kidney stones    passed  . Osteomyelitis of foot, left, acute ([place]) 11/09/2013    Patient Active Problem List   Diagnosis Date Noted  . Encounter for screening colonoscopy 04/04/2018  . Chronic narcotic use 04/04/2018  . S/P lumbar laminectomy 05/16/2015  . Osteomyelitis of foot, left, acute ([place]) 11/09/2013  . Necrotic toes ([place]) 11/08/2013  . Uncontrolled diabetes mellitus type 2 without complications ([place]) [id number]  . Cellulitis of second toe of left foot 11/08/2013  . Hyperglycemia 11/08/2013  . Tobacco abuse 11/08/2013  . Cellulitis of toe of left foot 11/08/2013  . Hip fracture, right ([place]) 03/18/2011  . Diabetes mellitus  03/18/2011    Past Surgical History:  Procedure Laterality Date  . BREAST SURGERY Left    Left breast biopsy, negative  . CESAREAN SECTION    . CHOLECYSTECTOMY    . HIP ARTHROPLASTY  03/16/2011   Procedure: Right ARTHROPLASTY BIPOLAR HIP;  Surgeon: Sanjuana Kava;  Location: [facility];  Service: Orthopedics;  Laterality: Right;  . LARYNGOSCOPY AND ESOPHAGOSCOPY N/A 07/29/2018   Procedure: DIRECT LARYNGOSCOPY AND ESOPHAGOSCOPY WITH BIOPSY;  Surgeon: Izora Gala, MD;  Location: [place];  Service: ENT;  Laterality: N/A;  . LUMBAR LAMINECTOMY/DECOMPRESSION MICRODISCECTOMY Left 05/16/2015   Procedure: Laminectomy and Foraminotomy - L4-L5 - left, resection of synovial cyst;  Surgeon: Eustace Moore, MD;  Location: [place] [facility];  Service: Neurosurgery;  Laterality: Left;  Laminectomy and Foraminotomy - L4-L5 - left, resection of synovial cyst     OB History   No obstetric history on file.     Family History  Problem Relation Age of Onset  . Diabetes Mother   . Colon cancer Neg Hx     Social History   Tobacco Use  . Smoking status: Current Every Day Smoker    Packs/day: 0.50    Years: 25.00    Pack years: 12.50    Types: Cigarettes  . Smokeless tobacco: Never Used  . Tobacco comment: "going to try to quit"  Substance Use Topics  . Alcohol use: No  . Drug use: No  Home Medications Prior to Admission medications   Medication Sig Start Date End Date Taking? Authorizing Provider  acetaminophen (TYLENOL) 500 MG tablet Take 1,500 mg by mouth every 6 (six) hours as needed for moderate pain or headache.    [provider]  albuterol (PROVENTIL HFA;VENTOLIN HFA) 108 (90 Base) MCG/ACT inhaler Inhale 2 puffs into the lungs every 6 (six) hours as needed for wheezing or shortness of breath.     [provider]  cephALEXin (KEFLEX) 500 MG capsule Take 500 mg by mouth 4 (four) times daily. 07/08/18   [provider]  HYDROcodone-acetaminophen (NORCO) 7.5-325 MG tablet  Take 1 tablet by mouth 4 (four) times daily as needed (pain.).  04/03/18   [provider]  polyethylene glycol-electrolytes (TRILYTE) 420 g solution Take 4,000 mLs by mouth as directed. 04/06/18   Rourk, Cristopher Estimable, MD  predniSONE (STERAPRED UNI-PAK 21 TAB) 10 MG (21) TBPK tablet Take by mouth daily. Take 6 tabs by mouth daily  for 2 days, then 5 tabs for 2 days, then 4 tabs for 2 days, then 3 tabs for 2 days, 2 tabs for 2 days, then 1 tab by mouth daily for 2 days 07/31/19   Rodell Perna A, PA-C  valACYclovir (VALTREX) 1000 MG tablet Take 1 tablet (1,000 mg total) by mouth 3 (three) times daily for 10 days. 07/31/19 08/10/19  Rodell Perna A, PA-C    Allergies    Morphine and related and Tramadol  Review of Systems   Review of Systems  Constitutional: Negative for chills and fever.  HENT: Negative for ear discharge, ear pain and trouble swallowing.   Eyes: Negative for photophobia and visual disturbance.  Respiratory: Negative for shortness of breath.   Cardiovascular: Negative for chest pain.  Gastrointestinal: Negative for abdominal pain, nausea and vomiting.  Skin: Positive for rash.  Neurological: Positive for headaches.  All other systems reviewed and are negative.   Physical Exam Updated Vital Signs BP 131/66 (BP Location: Right Arm)   Pulse 92   Temp 98.7 F (37.1 C)   Resp 18   Ht 5\' 7"  (1.702 m)   Wt 54.4 kg   SpO2 100%   BMI 18.78 kg/m   Physical Exam Vitals and nursing note reviewed.  Constitutional:      General: She is not in acute distress.    Appearance: She is well-developed.  HENT:     Head: Normocephalic and atraumatic.     Right Ear: Tympanic membrane normal.     Left Ear: Tympanic membrane normal.     Ears:     Comments: No vesicles noted to the left TM.  No canal edema or drainage noted.    Mouth/Throat:     Comments: No swelling of the lips or tongue.  Tolerating secretions without difficulty.  No abnormal phonation. Eyes:     General:         Right eye: No discharge.        Left eye: No discharge.     Extraocular Movements: Extraocular movements intact.     Conjunctiva/sclera: Conjunctivae normal.     Pupils: Pupils are equal, round, and reactive to light.     Comments: No conjunctival injection  Neck:     Vascular: No JVD.     Trachea: No tracheal deviation.     Comments: Normal active range of motion of the cervical spine without restriction. Cardiovascular:     Rate and Rhythm: Normal rate.  Pulmonary:  Effort: Pulmonary effort is normal.     Comments: Speaking in full sentences without difficulty. Abdominal:     General: There is no distension.  Musculoskeletal:     Cervical back: Neck supple. No rigidity.  Skin:    General: Skin is warm and dry.     Findings: Erythema and rash present.     Comments: See below images.  Patient has an erythematous vesicular rash to the left side of the neck extending into the left ear and the left cheek.  It does not cross the midline.  There are some vesicular lesions that have convalesced into plaques.  Mild tenderness to palpation to light touch.  Neurological:     Mental Status: She is alert.  Psychiatric:        Behavior: Behavior normal.           ED Results / Procedures / Treatments   Labs (all labs ordered are listed, but only abnormal results are displayed) Labs Reviewed - No data to display  EKG None  Radiology No results found.  Procedures Procedures (including critical care time)  Medications Ordered in ED Medications  valACYclovir (VALTREX) tablet 1,000 mg (1,000 mg Oral Given 07/31/19 2125)  predniSONE (DELTASONE) tablet 60 mg (60 mg Oral Given 07/31/19 2121)    ED Course  I have reviewed the triage vital signs and the nursing notes.  Pertinent labs & imaging results that were available during my care of the patient were reviewed by me and considered in my medical decision making (see chart for details).    MDM Rules/Calculators/A&P                       Rash most consistent with shingles.  No signs of secondary skin infection.  Appears to affect multiple dermatomes.  No signs of Ramsay Hunt syndrome or HSV ophthalmicus.  No neck stiffness.  Patient is afebrile, vital signs are stable.  She is nontoxic in appearance.  She has an appointment to see her PCP tomorrow.  She is tolerating secretions without difficulty, no evidence of anaphylaxis or angioedema.  Nikolsky sign absent on exam today.  No evidence of SJS, TENS, [place] spotted fever, or tick-borne illness. Will start on valacyclovir and course of prednisone. Recommend follow up with PCP tomorrow as planned. Discussed strict ED return precautions. Patient verbalized understanding of and agreement with plan and is safe for discharge home at this time. Discussed with Dr. Rogene Houston who agrees with assessment and plan at this time. Final Clinical Impression(s) / ED Diagnoses Final diagnoses:  Rash    Rx / DC Orders ED Discharge Orders         Ordered    predniSONE (STERAPRED UNI-PAK 21 TAB) 10 MG (21) TBPK tablet  Daily     07/31/19 2118    valACYclovir (VALTREX) 1000 MG tablet  3 times daily     07/31/19 2118           Debroah Baller 07/31/19 2207    Fredia Sorrow, MD 08/01/19 [phone]

## 2019-07-31 NOTE — ED Triage Notes (Signed)
Rash x 1 week to L neck   Called MD spoke with RN last week by phone  Has taken no benadryl, no meds  Reports was told to come to ED   Has appt with MD tomorrow morning

## 2019-07-31 NOTE — ED Notes (Signed)
Call to [facility] for med

## 2019-07-31 NOTE — ED Notes (Signed)
Pt reports rash x 1 week   Also HA   Has taken no OTC meds for either   Has appt with PCP in the am   Raised reddened rash to L neck   Pt has a hx of throat Ca and had radiation to her neck  Continues to smoke

## 2020-07-15 ENCOUNTER — Other Ambulatory Visit: Payer: Self-pay | Admitting: Otolaryngology

## 2020-07-15 DIAGNOSIS — F17200 Nicotine dependence, unspecified, uncomplicated: Secondary | ICD-10-CM

## 2020-07-15 DIAGNOSIS — C131 Malignant neoplasm of aryepiglottic fold, hypopharyngeal aspect: Secondary | ICD-10-CM

## 2020-07-30 ENCOUNTER — Ambulatory Visit
Admission: RE | Admit: 2020-07-30 | Discharge: 2020-07-30 | Disposition: A | Discharge status: Home / Self Care | Payer: 59 | Source: Ambulatory Visit | Attending: Otolaryngology | Admitting: Otolaryngology

## 2020-07-30 DIAGNOSIS — F17200 Nicotine dependence, unspecified, uncomplicated: Secondary | ICD-10-CM

## 2020-07-30 DIAGNOSIS — C131 Malignant neoplasm of aryepiglottic fold, hypopharyngeal aspect: Secondary | ICD-10-CM

## 2020-07-30 MED ORDER — IOPAMIDOL (ISOVUE-300) INJECTION 61%
75.0000 mL | Freq: Once | INTRAVENOUS | Status: AC | PRN
  Administered 2020-07-30: 75 mL via INTRAVENOUS

## 2021-04-04 IMAGING — CT CT NECK W/ CM
5 of 6 series · 14 of 33 positions shown, 16 images · IV contrast (iopamidol)
Comparison: 07/21/2018 CT.  PET scans 08/22/2018 and 07/10/2019.

CLINICAL DATA: Throat cancer. Chemotherapy and radiation.
Follow-up.

Creatinine was obtained on site at [HOSPITAL] at [REDACTED].
Results: Creatinine 0.7 mg/dL.
EXAM:
CT NECK WITH CONTRAST
TECHNIQUE: Multidetector CT imaging of the neck was performed using the
standard protocol following the bolus administration of intravenous
contrast.
CONTRAST:  75mL GR88R9-FVV IOPAMIDOL (GR88R9-FVV) INJECTION 61%

[Series 2: neck 2.00 br40 s3 st/ no angle · axial · 0.43mm/px · z∈[-739,-655]mm · 2 of 127 slices shown, 3 images]
[im 43/127  soft-tissue]
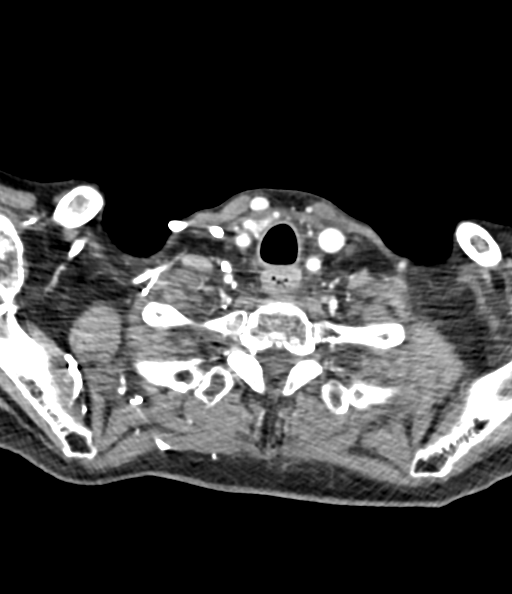
[im 43/127  bone]
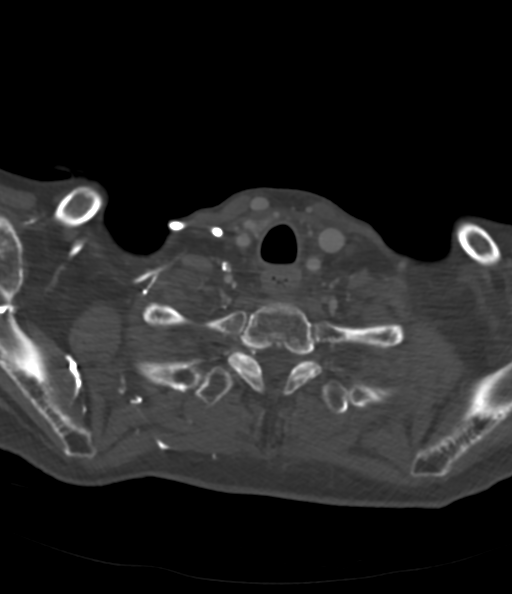
[im 85/127  bone]
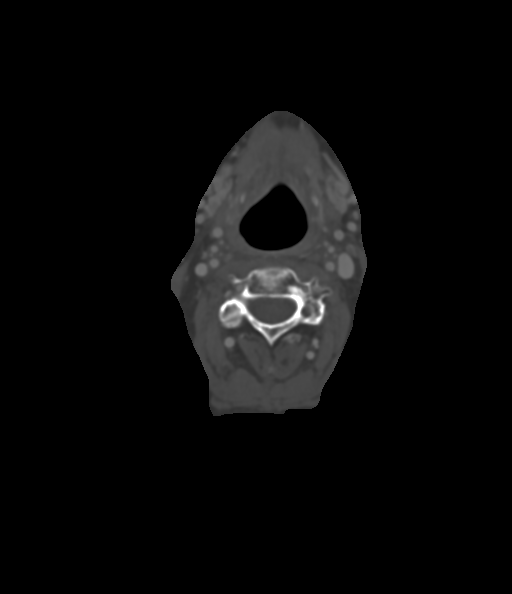

[Series 4: neck 2.00 br60 s3 bone/ no angle · axial · 0.43mm/px · z∈[-739,-655]mm · 2 of 127 slices shown]
[im 43/127  bone]
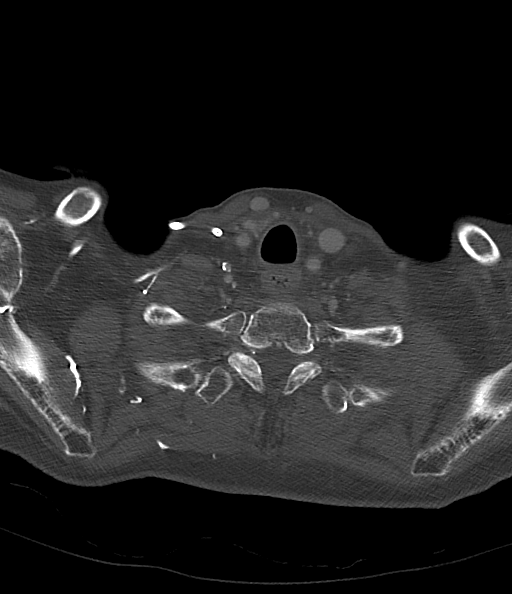
[im 85/127  bone]
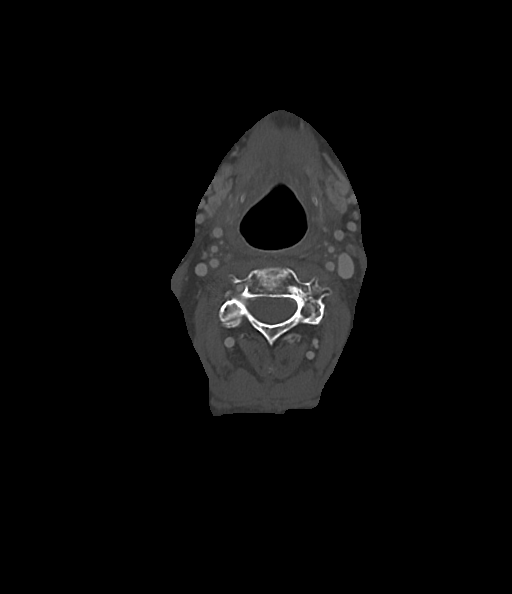

[Series 6: neck 2.00 br36 s3 angled axial (person_name) · axial · 0.43mm/px · z∈[-739,-655]mm · 2 of 127 slices shown]
[im 43/127  bone]
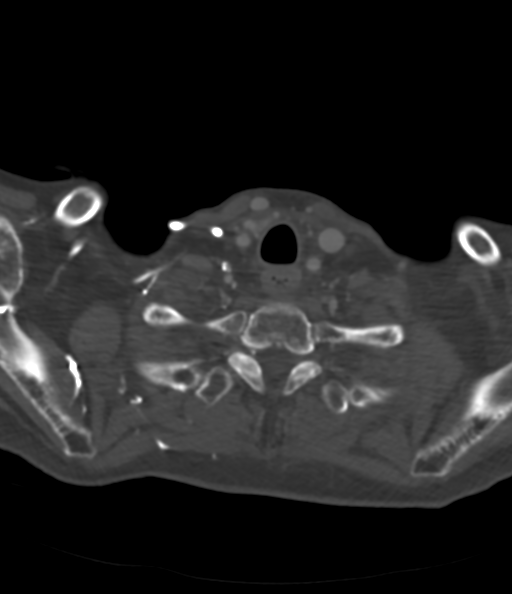
[im 85/127  bone]
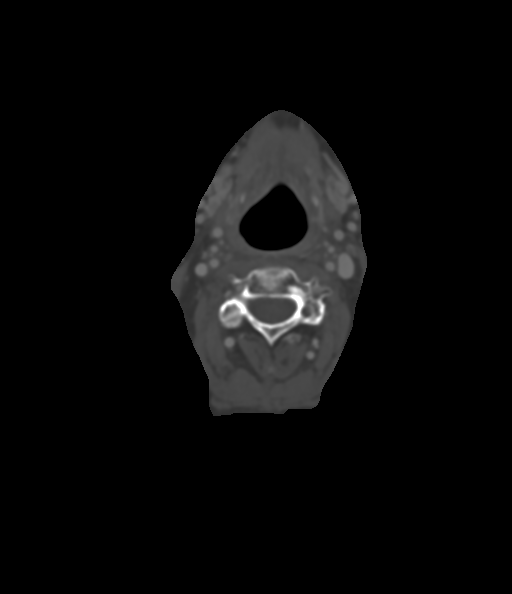

[Series 10: neck 2.00 br40 s3 (person_name) · coronal · 0.43mm/px · 3 of 128 slices shown (1 of 2)]
[im 26/128  bone]
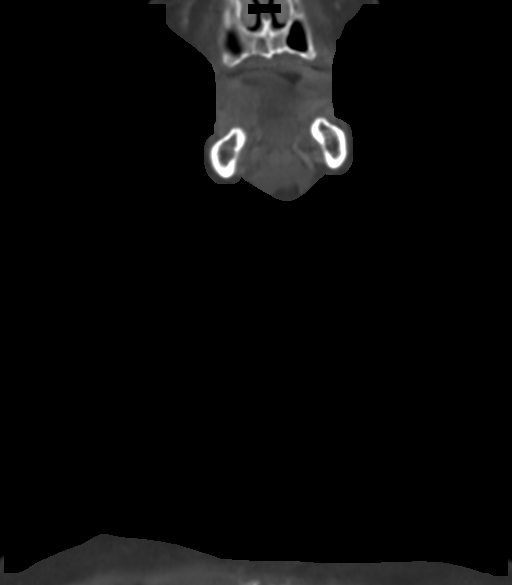
[im 51/128  bone]
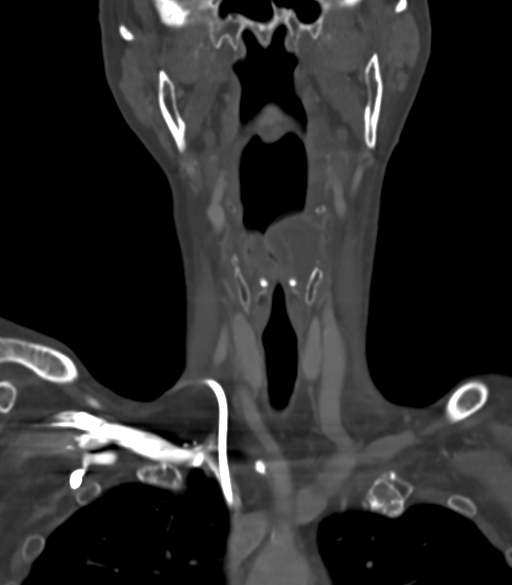
[im 77/128  bone]
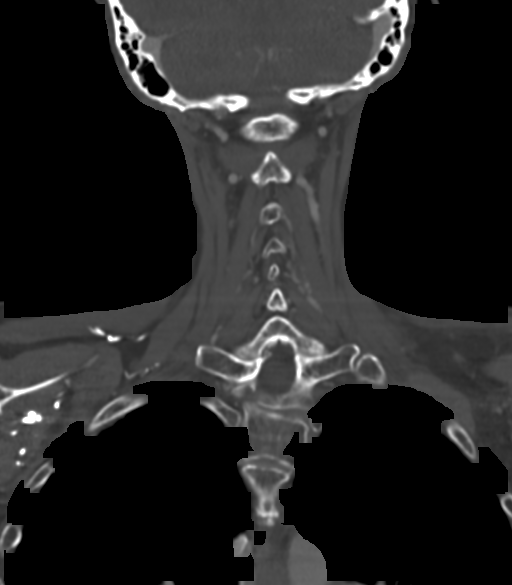

[Series 12: neck 2.00 br40 s3 (person_name) · sagittal · 0.50mm/px · 5 of 111 slices shown, 6 images (2 of 2)]
[im 37/111  bone]
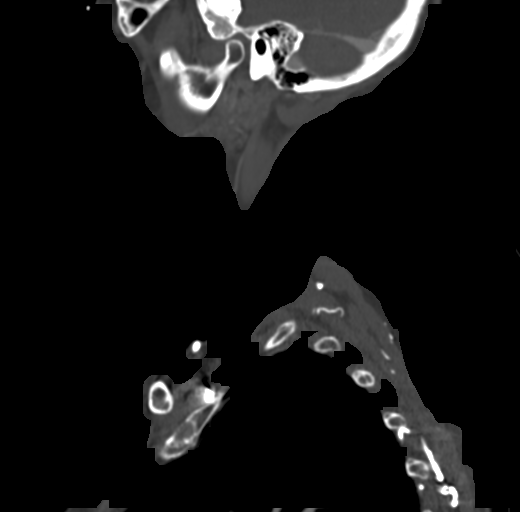
[im 46/111  bone]
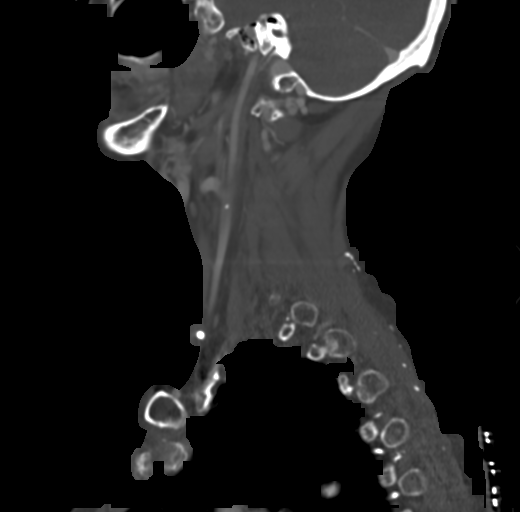
[im 56/111  soft-tissue]
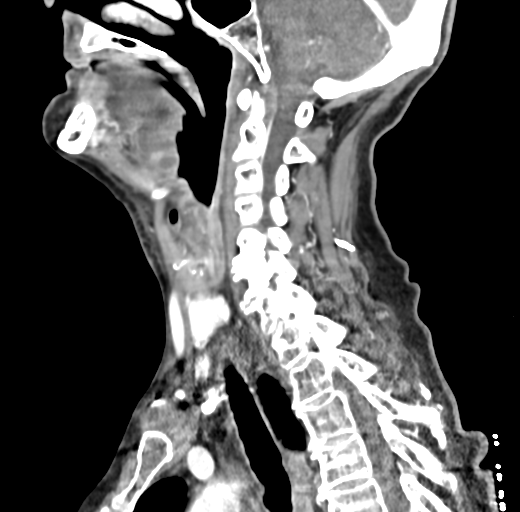
[im 56/111  bone]
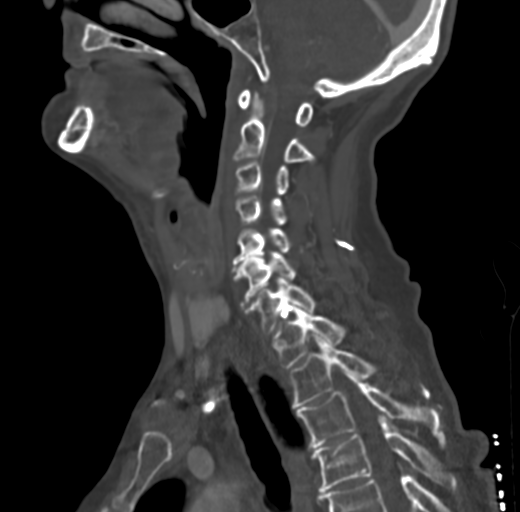
[im 65/111  bone]
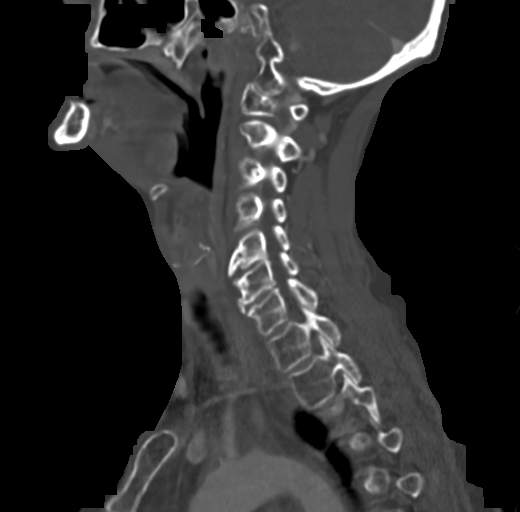
[im 74/111  bone]
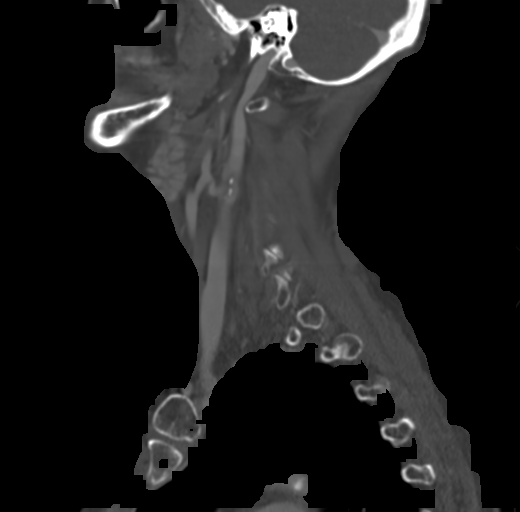

[14 of 33 positions shown; findings below may reference images not displayed]

FINDINGS: Pharynx and larynx: Mild soft tissue prominence, mucosal edema and
submucosal edema in the supraglottic region, more to the left of
midline, consistent with post radiation edema. No sign of definite
residual viable tumor in that area. PET scan was negative.

Salivary glands: Parotid gland on the left shows some atrophic
change. Parotid gland on the right more normal. Both submandibular
glands show atrophic change.

Thyroid: Normal

Lymph nodes: Resolution of previously seen adenopathy. No enlarged
nodes presently.

Vascular: Intimal thickening, potentially related to regional
radiation. Atherosclerotic disease at the carotid bifurcation and
ICA bulb regions. No stenosis greater than 50% identified.

Limited intracranial: Calcified meningioma at the anterior middle
cranial fossa on the right as seen previously.

Visualized orbits: Not included.

Mastoids and visualized paranasal sinuses: Clear

Skeleton: Ordinary cervical spondylosis.

Upper chest: Emphysema and pulmonary scarring. No evidence of
metastatic disease to the upper chest. Aortic atherosclerotic
calcification.

Other: None
IMPRESSION: 1. Post radiation changes of the supraglottic region, more to the
left of midline, with mucosal and submucosal edema. No sign of
definite residual viable tumor in that area. Resolution of
previously seen adenopathy. No enlarged nodes presently.
2. Emphysema and pulmonary scarring. No evidence of metastatic
disease to the upper chest.
3. Calcified meningioma at the anterior middle cranial fossa on the
right as seen previously.
4. Emphysema and aortic atherosclerosis.
5. HUNSAKER 1

Aortic Atherosclerosis (U3KVI-SB3.3) and Emphysema (U3KVI-DRU.B).

## 2022-01-26 ENCOUNTER — Other Ambulatory Visit (HOSPITAL_COMMUNITY): Payer: Self-pay | Admitting: Obstetrics and Gynecology

## 2022-01-26 DIAGNOSIS — Z1231 Encounter for screening mammogram for malignant neoplasm of breast: Secondary | ICD-10-CM

## 2022-01-26 DIAGNOSIS — Z780 Asymptomatic menopausal state: Secondary | ICD-10-CM

## 2022-02-11 ENCOUNTER — Ambulatory Visit (HOSPITAL_COMMUNITY)
Admission: RE | Admit: 2022-02-11 | Discharge: 2022-02-11 | Disposition: A | Discharge status: Home / Self Care | Payer: Medicare Other | Source: Ambulatory Visit | Attending: Obstetrics and Gynecology | Admitting: Obstetrics and Gynecology

## 2022-02-11 DIAGNOSIS — Z780 Asymptomatic menopausal state: Secondary | ICD-10-CM | POA: Diagnosis present

## 2022-02-11 DIAGNOSIS — Z1231 Encounter for screening mammogram for malignant neoplasm of breast: Secondary | ICD-10-CM

## 2023-04-09 ENCOUNTER — Encounter: Payer: Self-pay | Admitting: Family Medicine

## 2023-04-09 ENCOUNTER — Ambulatory Visit (INDEPENDENT_AMBULATORY_CARE_PROVIDER_SITE_OTHER): Payer: 59 | Admitting: Family Medicine

## 2023-04-09 VITALS — BP 110/66 | HR 96 | Ht 67.5 in | Wt 157.1 lb

## 2023-04-09 DIAGNOSIS — L608 Other nail disorders: Secondary | ICD-10-CM

## 2023-04-09 DIAGNOSIS — B351 Tinea unguium: Secondary | ICD-10-CM | POA: Diagnosis not present

## 2023-04-09 DIAGNOSIS — Z1159 Encounter for screening for other viral diseases: Secondary | ICD-10-CM

## 2023-04-09 DIAGNOSIS — F1190 Opioid use, unspecified, uncomplicated: Secondary | ICD-10-CM

## 2023-04-09 DIAGNOSIS — Z720 Tobacco use: Secondary | ICD-10-CM | POA: Diagnosis not present

## 2023-04-09 DIAGNOSIS — E038 Other specified hypothyroidism: Secondary | ICD-10-CM

## 2023-04-09 DIAGNOSIS — Z122 Encounter for screening for malignant neoplasm of respiratory organs: Secondary | ICD-10-CM

## 2023-04-09 DIAGNOSIS — Z23 Encounter for immunization: Secondary | ICD-10-CM

## 2023-04-09 DIAGNOSIS — R1032 Left lower quadrant pain: Secondary | ICD-10-CM

## 2023-04-09 DIAGNOSIS — E7849 Other hyperlipidemia: Secondary | ICD-10-CM

## 2023-04-09 DIAGNOSIS — Z1211 Encounter for screening for malignant neoplasm of colon: Secondary | ICD-10-CM

## 2023-04-09 DIAGNOSIS — E559 Vitamin D deficiency, unspecified: Secondary | ICD-10-CM

## 2023-04-09 DIAGNOSIS — E1165 Type 2 diabetes mellitus with hyperglycemia: Secondary | ICD-10-CM

## 2023-04-09 MED ORDER — HYDROCODONE-ACETAMINOPHEN 7.5-325 MG PO TABS: 1.0000 | ORAL_TABLET | Freq: Four times a day (QID) | ORAL | 0 refills | Status: DC | PRN

## 2023-04-09 MED ORDER — TERBINAFINE HCL 1 % EX CREA: 1.0000 | TOPICAL_CREAM | Freq: Two times a day (BID) | CUTANEOUS | 0 refills | Status: DC

## 2023-04-09 NOTE — Assessment & Plan Note (Signed)
Started at 67 years old Smokes about 1/2 pack/day  Asked about quitting: confirms that she currently smokes cigarettes Advise to quit smoking: Educated about QUITTING to reduce the risk of cancer, cardio and cerebrovascular disease. Assess willingness: Unwilling to quit at this time, but is working on cutting back. Assist with counseling and pharmacotherapy: Counseled for 5 minutes and literature provided. Arrange for follow up: follow up in 3 months and continue to offer help.

## 2023-04-09 NOTE — Patient Instructions (Addendum)
I appreciate the opportunity to provide care to you today!    Follow up:  3 months  Fasting Labs: please stop by the lab today to get your blood drawn (CBC, CMP, TSH, Lipid profile, HgA1c, Vit D)  Screening: HIV and Hep C  Due for eye exam  Please schedule a Medicare Annual Wellness  Fungal Infection of the Toenails: -A prescription for Terbinafine 1% cream has been sent to your pharmacy. Apply topically twice daily for the treatment of toenail fungus.  Chronic Opioid Use -As discussed, I am willing to refill Norco 7.5-325 mg and will provide 20 tablets monthly until you can establish care with a pain management specialist. - A referral has been placed for pain management to help manage your chronic pain.  Left Lower Quadrant Abdominal Cramps: -I have placed an order for an ultrasound of your abdomen to evaluate for any underlying pathology. -I recommend applying heat to the affected area for 10 to 15 minutes as needed for pain relief. -Please continue to perform circular massages around the bellybutton to help relieve cramps and bloating. -I suggest increasing your fluid intake to at least 64 ounces daily and increasing your intake of oatmeal and fruits to improve bowel movements. -Including probiotics, such as yogurt, can also help reduce cramps related to digestive issues.   I recommend smoking cessation as a critical step for improving your overall health. Smoking significantly increases your risk for a range of serious health issues, including cancer, chronic obstructive pulmonary disease (COPD), high blood pressure, cataracts, and various digestive problems. Additionally, smoking can lead to oral health issues such as gum disease, mouth sores, tooth loss, and diminished taste and smell. It also irritates the throat and contributes to persistent coughing. Quitting smoking can greatly reduce these risks and improve your quality of life.      Please stop by your local pharmacy and  get your Tdap and Shingles vaccine  Referrals today- Pulmonary for lung cancer screening/ GI for colon cancer screening   Please continue to a heart-healthy diet and increase your physical activities. Try to exercise for at least five days a week.    It was a pleasure to see you and I look forward to continuing to work together on your health and well-being. Please do not hesitate to call the office if you need care or have questions about your care.  In case of emergency, please visit the Emergency Department for urgent care, or contact our clinic at [phone] to schedule an appointment. We're here to help you!   Have a wonderful day and week. With Gratitude, Gilmore Laroche MSN, FNP-BC

## 2023-04-11 DIAGNOSIS — R1032 Left lower quadrant pain: Secondary | ICD-10-CM | POA: Insufficient documentation

## 2023-04-11 DIAGNOSIS — B351 Tinea unguium: Secondary | ICD-10-CM | POA: Insufficient documentation

## 2023-04-11 LAB — CMP14+EGFR
ALT: 9 [IU]/L (ref 0–32)
AST: 14 [IU]/L (ref 0–40)
Albumin: 4.3 g/dL (ref 3.9–4.9)
Alkaline Phosphatase: 71 [IU]/L (ref 44–121)
BUN/Creatinine Ratio: 19 (ref 12–28)
BUN: 21 mg/dL (ref 8–27)
Bilirubin Total: 0.2 mg/dL (ref 0.0–1.2)
CO2: 26 mmol/L (ref 20–29)
Calcium: 10.1 mg/dL (ref 8.7–10.3)
Chloride: 97 mmol/L (ref 96–106)
Creatinine, Ser: 1.09 mg/dL — ABNORMAL HIGH (ref 0.57–1.00)
Globulin, Total: 2.7 g/dL (ref 1.5–4.5)
Glucose: 116 mg/dL — ABNORMAL HIGH (ref 70–99)
Potassium: 4.5 mmol/L (ref 3.5–5.2)
Sodium: 140 mmol/L (ref 134–144)
Total Protein: 7 g/dL (ref 6.0–8.5)
eGFR: 55 mL/min/{1.73_m2} — ABNORMAL LOW (ref >59)

## 2023-04-11 LAB — CBC WITH DIFFERENTIAL/PLATELET
Basophils Absolute: 0 10*3/uL (ref 0.0–0.2)
Basos: 1 %
EOS (ABSOLUTE): 0.1 10*3/uL (ref 0.0–0.4)
Eos: 2 %
Hematocrit: 42.1 % (ref 34.0–46.6)
Hemoglobin: 13.5 g/dL (ref 11.1–15.9)
Immature Grans (Abs): 0 10*3/uL (ref 0.0–0.1)
Immature Granulocytes: 0 %
Lymphocytes Absolute: 1.9 10*3/uL (ref 0.7–3.1)
Lymphs: 31 %
MCH: 30 pg (ref 26.6–33.0)
MCHC: 32.1 g/dL (ref 31.5–35.7)
MCV: 94 fL (ref 79–97)
Monocytes Absolute: 0.5 10*3/uL (ref 0.1–0.9)
Monocytes: 7 %
Neutrophils Absolute: 3.8 10*3/uL (ref 1.4–7.0)
Neutrophils: 59 %
Platelets: 192 10*3/uL (ref 150–450)
RBC: 4.5 x10E6/uL (ref 3.77–5.28)
RDW: 12.7 % (ref 11.7–15.4)
WBC: 6.3 10*3/uL (ref 3.4–10.8)

## 2023-04-11 LAB — LIPID PANEL
Chol/HDL Ratio: 4.1 {ratio} (ref 0.0–4.4)
Cholesterol, Total: 172 mg/dL (ref 100–199)
HDL: 42 mg/dL (ref >39)
LDL Chol Calc (NIH): 97 mg/dL (ref 0–99)
Triglycerides: 193 mg/dL — ABNORMAL HIGH (ref 0–149)
VLDL Cholesterol Cal: 33 mg/dL (ref 5–40)

## 2023-04-11 LAB — HEPATITIS C ANTIBODY: Hep C Virus Ab: NONREACTIVE

## 2023-04-11 LAB — TSH+FREE T4
Free T4: 1.23 ng/dL (ref 0.82–1.77)
TSH: 6.37 u[IU]/mL — ABNORMAL HIGH (ref 0.450–4.500)

## 2023-04-11 LAB — HEMOGLOBIN A1C
Est. average glucose Bld gHb Est-mCnc: 146 mg/dL
Hgb A1c MFr Bld: 6.7 % — ABNORMAL HIGH (ref 4.8–5.6)

## 2023-04-11 LAB — MICROALBUMIN / CREATININE URINE RATIO
Creatinine, Urine: 146.5 mg/dL
Microalb/Creat Ratio: 3 mg/g{creat} (ref 0–29)
Microalbumin, Urine: 4.1 ug/mL

## 2023-04-11 LAB — VITAMIN D 25 HYDROXY (VIT D DEFICIENCY, FRACTURES): Vit D, 25-Hydroxy: 24.4 ng/mL — ABNORMAL LOW (ref 30.0–100.0)

## 2023-04-11 NOTE — Assessment & Plan Note (Signed)
I discussed and patient is in agreement to refill Norco 7.5-325 mg and will provide 20 tablets monthly until she can establish care with a pain management specialist. - A referral has been placed for pain management to help manage your chronic pain

## 2023-04-11 NOTE — Progress Notes (Signed)
New Patient Office Visit  Subjective:  Patient ID: Brandy Fisher, female    DOB: 1955/10/14  Age: 67 y.o. MRN: [id number]  CC:  Chief Complaint  Patient presents with   Establish Care    New patient    Hip Pain    Pt reports hip pain and back pain from surgeries in the past, states she needs refill on her pain medication, was getting this filled by last pcp, Dr. Sudie Bailey.     HPI Brandy Fisher is a 67 y.o. female with past medical history of chronic narcotic use, hypothyroidism and tobacco use presents for establishing care. For the details of today's visit, please refer to the assessment and plan.    Left lower quadrant pain: The patient experienced two episodes of left lower quadrant cramping in the clinic, which she describes as chronic. She denies any changes in bowel or bladder habits, as well as nausea, vomiting, radiation of pain to the back, fever, or chills. The cramping occurs sporadically and is relieved with massage. The etiology remains unclear to the patient despite various visits to multiple doctors previously.  Past Medical History:  Diagnosis Date   Arthritis    Cancer (HCC)    Throat cancer   Cough    GREEN SPUTUM    Diabetes mellitus    Type II   History of kidney stones    passed   Osteomyelitis of foot, left, acute (HCC) 11/09/2013    Past Surgical History:  Procedure Laterality Date   BREAST SURGERY Left    Left breast biopsy, negative   CESAREAN SECTION     CHOLECYSTECTOMY     HIP ARTHROPLASTY  03/16/2011   Procedure: Right ARTHROPLASTY BIPOLAR HIP;  Surgeon: Darreld Mclean;  Location: [facility];  Service: Orthopedics;  Laterality: Right;   LARYNGOSCOPY AND ESOPHAGOSCOPY N/A 07/29/2018   Procedure: DIRECT LARYNGOSCOPY AND ESOPHAGOSCOPY WITH BIOPSY;  Surgeon: Serena Colonel, MD;  [facility];  Service: ENT;  Laterality: N/A;   LUMBAR LAMINECTOMY/DECOMPRESSION MICRODISCECTOMY Left 05/16/2015   Procedure: Laminectomy and Foraminotomy - L4-L5 - left,  resection of synovial cyst;  Surgeon: Tia Alert, MD;  Location: [facility];  Service: Neurosurgery;  Laterality: Left;  Laminectomy and Foraminotomy - L4-L5 - left, resection of synovial cyst    Family History  Problem Relation Age of Onset   Diabetes Mother    Colon cancer Neg Hx     Social History   Socioeconomic History   Marital status: Widowed    Spouse name: Not on file   Number of children: Not on file   Years of education: Not on file   Highest education level: Not on file  Occupational History   Not on file  Tobacco Use   Smoking status: Every Day    Current packs/day: 0.50    Average packs/day: 0.5 packs/day for 79.0 years (39.5 ttl pk-yrs)    Types: Cigarettes    Start date: 04/1969   Smokeless tobacco: Never   Tobacco comments:    1/2 ppd  Vaping Use   Vaping status: Never Used  Substance and Sexual Activity   Alcohol use: No   Drug use: No   Sexual activity: Not Currently  Other Topics Concern   Not on file  Social History Narrative   Not on file   Social Determinants of Health   Financial Resource Strain: Low Risk  (06/03/2022)   Received from [facility]   Overall Financial Resource Strain (CARDIA)  Difficulty of Paying Living Expenses: Not hard at all  Food Insecurity: No Food Insecurity (06/03/2022)   Received from [facility]   Hunger Vital Sign    Worried About Running Out of Food in the Last Year: Never true    Ran Out of Food in the Last Year: Never true  Transportation Needs: No Transportation Needs (06/03/2022)   Received from [facility] - Transportation    Lack of Transportation (Medical): No    Lack of Transportation (Non-Medical): No  Physical Activity: Inactive (06/03/2022)   Received from [facility]   Exercise Vital Sign    Days of Exercise per Week: 0 days    Minutes of Exercise per Session: 0 min  Stress: No Stress Concern Present (06/03/2022)   Received from [facility] - Occupational Stress Questionnaire    Feeling of Stress : Only a little  Social Connections: Socially Isolated (06/03/2022)   Received from [facility]   Social Connection and Isolation Panel [NHANES]    Frequency of Communication with Friends and Family: Once a week    Frequency of Social Gatherings with Friends and Family: Never    Attends Religious Services: Never    Database administrator or Organizations: No    Attends Banker Meetings: Never    Marital Status: Widowed  Intimate Partner Violence: Not At Risk (06/03/2022)   Received from [facility]   Humiliation, Afraid, Rape, and Kick questionnaire    Fear of Current or Ex-Partner: No    Emotionally Abused: No    Physically Abused: No    Sexually Abused: No    ROS Review of Systems  Constitutional:  Negative for chills and fever.  Eyes:  Negative for visual disturbance.  Respiratory:  Negative for chest tightness and shortness of breath.   Neurological:  Negative for dizziness and headaches.    Objective:   Today's Vitals: BP 110/66   Pulse 96   Ht 5' 7.5" (1.715 m)   Wt 157 lb 1.3 oz (71.3 kg)   SpO2 93%   BMI 24.24 kg/m   Physical Exam HENT:     Head: Normocephalic.     Mouth/Throat:     Mouth: Mucous membranes are moist.  Cardiovascular:     Rate and Rhythm: Normal rate.     Heart sounds: Normal heart sounds.  Pulmonary:     Effort: Pulmonary effort is normal.     Breath sounds: Normal breath sounds.  Neurological:     Mental Status: She is alert.      Assessment & Plan:   Chronic narcotic use Assessment & Plan: I discussed and patient is in agreement to refill Norco 7.5-325 mg and will provide 20 tablets monthly until she can establish care with a pain management specialist. - A referral has been placed for pain management to help manage your chronic pain  Orders: -     Ambulatory referral to Pain Clinic -     HYDROcodone-Acetaminophen;  Take 1 tablet by mouth every 6 (six) hours as needed (pain.).  Dispense: 20 tablet; Refill: 0  Tobacco use Assessment & Plan: Started at 67 years old Smokes about 1/2 pack/day  Asked about quitting: confirms that she currently smokes cigarettes Advise to quit smoking: Educated about QUITTING to reduce the risk of cancer, cardio and cerebrovascular disease. Assess willingness: Unwilling to quit at this time, but is working on cutting  back. Assist with counseling and pharmacotherapy: Counseled for 5 minutes and literature provided. Arrange for follow up: follow up in 3 months and continue to offer help.    Left lower quadrant abdominal pain Assessment & Plan: I have placed an order for an ultrasound of your abdomen to evaluate for any underlying pathology. -I recommend applying heat to the affected area for 10 to 15 minutes as needed for pain relief. -Please continue to perform circular massages around the bellybutton to help relieve cramps and bloating. -I suggest increasing your fluid intake to at least 64 ounces daily and increasing your intake of oatmeal and fruits to improve bowel movements. -Including probiotics, such as yogurt, can also help reduce cramps related to digestive issues.  Orders: -     US Abdomen Complete  Onychomycosis of toenail Assessment & Plan: Fungal Infection of the Toenails: -A prescription for Terbinafine 1% cream has been sent to your pharmacy. Apply topically twice daily for the treatment of toenail fungus.  Orders: -     Terbinafine HCl; Apply 1 Application topically 2 (two) times daily.  Dispense: 30 g; Refill: 0  Colon cancer screening -     Ambulatory referral to Gastroenterology  Type 2 diabetes mellitus with hyperglycemia, without long-term current use of insulin (HCC) -     Microalbumin / creatinine urine ratio -     Hemoglobin A1c -     HM Diabetes Foot Exam  Vitamin D deficiency -     VITAMIN D 25 Hydroxy (Vit-D Deficiency,  Fractures)  Other specified hypothyroidism -     TSH + free T4  Other hyperlipidemia -     Lipid panel -     CMP14+EGFR -     CBC with Differential/Platelet  Need for hepatitis C screening test -     Hepatitis C antibody  Encounter for screening for lung cancer -     Ambulatory Referral for Lung Cancer Scre  Immunization due -     Pneumococcal conjugate vaccine 20-valent  Encounter for immunization -     Flu Vaccine Trivalent High Dose (Fluad)  Note: This chart has been completed using [employer] software, and while attempts have been made to ensure accuracy, certain words and phrases may not be transcribed as intended.     Follow-up: Return in about 3 months (around 07/10/2023).   Gilmore Laroche, FNP

## 2023-04-11 NOTE — Assessment & Plan Note (Signed)
Fungal Infection of the Toenails: -A prescription for Terbinafine 1% cream has been sent to your pharmacy. Apply topically twice daily for the treatment of toenail fungus.

## 2023-04-11 NOTE — Assessment & Plan Note (Signed)
I have placed an order for an ultrasound of your abdomen to evaluate for any underlying pathology. -I recommend applying heat to the affected area for 10 to 15 minutes as needed for pain relief. -Please continue to perform circular massages around the bellybutton to help relieve cramps and bloating. -I suggest increasing your fluid intake to at least 64 ounces daily and increasing your intake of oatmeal and fruits to improve bowel movements. -Including probiotics, such as yogurt, can also help reduce cramps related to digestive issues.

## 2023-04-12 ENCOUNTER — Encounter: Payer: Self-pay | Admitting: *Deleted

## 2023-04-16 ENCOUNTER — Other Ambulatory Visit: Payer: Self-pay | Admitting: Family Medicine

## 2023-04-16 DIAGNOSIS — E038 Other specified hypothyroidism: Secondary | ICD-10-CM

## 2023-04-16 DIAGNOSIS — E1165 Type 2 diabetes mellitus with hyperglycemia: Secondary | ICD-10-CM

## 2023-04-16 DIAGNOSIS — E559 Vitamin D deficiency, unspecified: Secondary | ICD-10-CM

## 2023-04-16 MED ORDER — VITAMIN D (ERGOCALCIFEROL) 1.25 MG (50000 UNIT) PO CAPS: 50000.0000 [IU] | ORAL_CAPSULE | ORAL | 1 refills | Status: DC

## 2023-04-16 MED ORDER — METFORMIN HCL 1000 MG PO TABS: 1000.0000 mg | ORAL_TABLET | Freq: Every day | ORAL | 1 refills | Status: DC

## 2023-04-16 NOTE — Progress Notes (Signed)
  Please inform the patient that a prescription for a weekly vitamin D supplement has been sent to her pharmacy due to slightly low vitamin D levels. I have also started her on metformin 1000 mg to be taken daily for her type 2 diabetes, as her hemoglobin A1c is 6.7. I recommend decreasing her intake of high-sugar foods and beverages and increasing her physical activity. Additionally, her thyroid levels indicate subclinical hypothyroidism. I advise her to continue taking Synthroid 50 mcg daily and to return for lab work on May 17, 2023, to reassess her thyroid levels. All other lab results are stable.

## 2023-04-21 ENCOUNTER — Ambulatory Visit (HOSPITAL_COMMUNITY): Payer: 59

## 2023-04-29 ENCOUNTER — Ambulatory Visit (HOSPITAL_COMMUNITY): Payer: 59

## 2023-05-03 ENCOUNTER — Emergency Department (HOSPITAL_COMMUNITY): Payer: 59

## 2023-05-03 ENCOUNTER — Encounter (HOSPITAL_COMMUNITY): Admission: EM | Disposition: E | Payer: Self-pay | Source: Home / Self Care | Attending: Internal Medicine

## 2023-05-03 ENCOUNTER — Emergency Department (HOSPITAL_COMMUNITY): Admit: 2023-05-03 | Payer: 59 | Admitting: Cardiology

## 2023-05-03 ENCOUNTER — Inpatient Hospital Stay (HOSPITAL_COMMUNITY)
Admission: EM | Admit: 2023-05-03 | Discharge: 2023-06-06 | DRG: 091 | Disposition: E | Payer: 59 | Attending: Internal Medicine | Admitting: Internal Medicine

## 2023-05-03 DIAGNOSIS — J690 Pneumonitis due to inhalation of food and vomit: Secondary | ICD-10-CM | POA: Diagnosis present

## 2023-05-03 DIAGNOSIS — Z98891 History of uterine scar from previous surgery: Secondary | ICD-10-CM

## 2023-05-03 DIAGNOSIS — E038 Other specified hypothyroidism: Secondary | ICD-10-CM | POA: Diagnosis present

## 2023-05-03 DIAGNOSIS — D649 Anemia, unspecified: Secondary | ICD-10-CM | POA: Diagnosis present

## 2023-05-03 DIAGNOSIS — Z96641 Presence of right artificial hip joint: Secondary | ICD-10-CM | POA: Diagnosis present

## 2023-05-03 DIAGNOSIS — J9600 Acute respiratory failure, unspecified whether with hypoxia or hypercapnia: Secondary | ICD-10-CM | POA: Diagnosis not present

## 2023-05-03 DIAGNOSIS — Z79890 Hormone replacement therapy (postmenopausal): Secondary | ICD-10-CM

## 2023-05-03 DIAGNOSIS — Z7984 Long term (current) use of oral hypoglycemic drugs: Secondary | ICD-10-CM

## 2023-05-03 DIAGNOSIS — Z87442 Personal history of urinary calculi: Secondary | ICD-10-CM

## 2023-05-03 DIAGNOSIS — F419 Anxiety disorder, unspecified: Secondary | ICD-10-CM | POA: Diagnosis present

## 2023-05-03 DIAGNOSIS — I2129 ST elevation (STEMI) myocardial infarction involving other sites: Secondary | ICD-10-CM | POA: Diagnosis present

## 2023-05-03 DIAGNOSIS — G928 Other toxic encephalopathy: Principal | ICD-10-CM | POA: Diagnosis present

## 2023-05-03 DIAGNOSIS — J9601 Acute respiratory failure with hypoxia: Secondary | ICD-10-CM | POA: Diagnosis present

## 2023-05-03 DIAGNOSIS — I5021 Acute systolic (congestive) heart failure: Secondary | ICD-10-CM | POA: Diagnosis not present

## 2023-05-03 DIAGNOSIS — Z515 Encounter for palliative care: Secondary | ICD-10-CM

## 2023-05-03 DIAGNOSIS — Z9889 Other specified postprocedural states: Secondary | ICD-10-CM

## 2023-05-03 DIAGNOSIS — D72829 Elevated white blood cell count, unspecified: Secondary | ICD-10-CM | POA: Diagnosis not present

## 2023-05-03 DIAGNOSIS — I255 Ischemic cardiomyopathy: Secondary | ICD-10-CM | POA: Diagnosis present

## 2023-05-03 DIAGNOSIS — Z833 Family history of diabetes mellitus: Secondary | ICD-10-CM

## 2023-05-03 DIAGNOSIS — E440 Moderate protein-calorie malnutrition: Secondary | ICD-10-CM | POA: Insufficient documentation

## 2023-05-03 DIAGNOSIS — G894 Chronic pain syndrome: Secondary | ICD-10-CM | POA: Diagnosis present

## 2023-05-03 DIAGNOSIS — Z9221 Personal history of antineoplastic chemotherapy: Secondary | ICD-10-CM

## 2023-05-03 DIAGNOSIS — F17210 Nicotine dependence, cigarettes, uncomplicated: Secondary | ICD-10-CM | POA: Diagnosis present

## 2023-05-03 DIAGNOSIS — F1320 Sedative, hypnotic or anxiolytic dependence, uncomplicated: Secondary | ICD-10-CM | POA: Diagnosis present

## 2023-05-03 DIAGNOSIS — G931 Anoxic brain damage, not elsewhere classified: Secondary | ICD-10-CM | POA: Diagnosis present

## 2023-05-03 DIAGNOSIS — R0689 Other abnormalities of breathing: Secondary | ICD-10-CM | POA: Diagnosis not present

## 2023-05-03 DIAGNOSIS — E1165 Type 2 diabetes mellitus with hyperglycemia: Secondary | ICD-10-CM | POA: Diagnosis present

## 2023-05-03 DIAGNOSIS — Z66 Do not resuscitate: Secondary | ICD-10-CM | POA: Diagnosis not present

## 2023-05-03 DIAGNOSIS — I219 Acute myocardial infarction, unspecified: Secondary | ICD-10-CM

## 2023-05-03 DIAGNOSIS — Z6824 Body mass index (BMI) 24.0-24.9, adult: Secondary | ICD-10-CM

## 2023-05-03 DIAGNOSIS — J13 Pneumonia due to Streptococcus pneumoniae: Secondary | ICD-10-CM | POA: Diagnosis present

## 2023-05-03 DIAGNOSIS — Z85819 Personal history of malignant neoplasm of unspecified site of lip, oral cavity, and pharynx: Secondary | ICD-10-CM

## 2023-05-03 DIAGNOSIS — I2119 ST elevation (STEMI) myocardial infarction involving other coronary artery of inferior wall: Secondary | ICD-10-CM | POA: Diagnosis present

## 2023-05-03 DIAGNOSIS — Z79899 Other long term (current) drug therapy: Secondary | ICD-10-CM

## 2023-05-03 DIAGNOSIS — Z923 Personal history of irradiation: Secondary | ICD-10-CM

## 2023-05-03 DIAGNOSIS — Z9049 Acquired absence of other specified parts of digestive tract: Secondary | ICD-10-CM

## 2023-05-03 DIAGNOSIS — Z79891 Long term (current) use of opiate analgesic: Secondary | ICD-10-CM

## 2023-05-03 DIAGNOSIS — G9340 Encephalopathy, unspecified: Secondary | ICD-10-CM | POA: Diagnosis present

## 2023-05-03 DIAGNOSIS — I2111 ST elevation (STEMI) myocardial infarction involving right coronary artery: Secondary | ICD-10-CM | POA: Diagnosis not present

## 2023-05-03 DIAGNOSIS — Z885 Allergy status to narcotic agent status: Secondary | ICD-10-CM

## 2023-05-03 DIAGNOSIS — I2489 Other forms of acute ischemic heart disease: Secondary | ICD-10-CM | POA: Diagnosis not present

## 2023-05-03 DIAGNOSIS — R4182 Altered mental status, unspecified: Principal | ICD-10-CM

## 2023-05-03 DIAGNOSIS — L8990 Pressure ulcer of unspecified site, unspecified stage: Secondary | ICD-10-CM | POA: Insufficient documentation

## 2023-05-03 DIAGNOSIS — Z794 Long term (current) use of insulin: Secondary | ICD-10-CM

## 2023-05-03 LAB — CBC WITH DIFFERENTIAL/PLATELET
Abs Immature Granulocytes: 0.38 10*3/uL — ABNORMAL HIGH (ref 0.00–0.07)
Basophils Absolute: 0 10*3/uL (ref 0.0–0.1)
Basophils Relative: 0 %
Eosinophils Absolute: 0 10*3/uL (ref 0.0–0.5)
Eosinophils Relative: 0 %
HCT: 33.6 % — ABNORMAL LOW (ref 36.0–46.0)
Hemoglobin: 11.2 g/dL — ABNORMAL LOW (ref 12.0–15.0)
Immature Granulocytes: 5 %
Lymphocytes Relative: 20 %
Lymphs Abs: 1.7 10*3/uL (ref 0.7–4.0)
MCH: 30.6 pg (ref 26.0–34.0)
MCHC: 33.3 g/dL (ref 30.0–36.0)
MCV: 91.8 fL (ref 80.0–100.0)
Monocytes Absolute: 0.8 10*3/uL (ref 0.1–1.0)
Monocytes Relative: 10 %
Neutro Abs: 5.6 10*3/uL (ref 1.7–7.7)
Neutrophils Relative %: 65 %
Platelets: 252 10*3/uL (ref 150–400)
RBC: 3.66 MIL/uL — ABNORMAL LOW (ref 3.87–5.11)
RDW: 13.8 % (ref 11.5–15.5)
WBC: 8.5 10*3/uL (ref 4.0–10.5)
nRBC: 1.2 % — ABNORMAL HIGH (ref 0.0–0.2)

## 2023-05-03 LAB — LIPID PANEL
Cholesterol: 131 mg/dL (ref 0–200)
HDL: 16 mg/dL — ABNORMAL LOW (ref >40)
LDL Cholesterol: 89 mg/dL (ref 0–99)
Total CHOL/HDL Ratio: 8.2 {ratio}
Triglycerides: 131 mg/dL (ref <150)
VLDL: 26 mg/dL (ref 0–40)

## 2023-05-03 LAB — I-STAT ARTERIAL BLOOD GAS, ED
Acid-base deficit: 1 mmol/L (ref 0.0–2.0)
Bicarbonate: 24.3 mmol/L (ref 20.0–28.0)
Calcium, Ion: 1.15 mmol/L (ref 1.15–1.40)
HCT: 32 % — ABNORMAL LOW (ref 36.0–46.0)
Hemoglobin: 10.9 g/dL — ABNORMAL LOW (ref 12.0–15.0)
O2 Saturation: 100 %
Patient temperature: 98.6
Potassium: 4 mmol/L (ref 3.5–5.1)
Sodium: 133 mmol/L — ABNORMAL LOW (ref 135–145)
TCO2: 26 mmol/L (ref 22–32)
pCO2 arterial: 44.2 mm[Hg] (ref 32–48)
pH, Arterial: 7.349 — ABNORMAL LOW (ref 7.35–7.45)
pO2, Arterial: 414 mm[Hg] — ABNORMAL HIGH (ref 83–108)

## 2023-05-03 LAB — COMPREHENSIVE METABOLIC PANEL
ALT: 44 U/L (ref 0–44)
AST: 32 U/L (ref 15–41)
Albumin: 2.7 g/dL — ABNORMAL LOW (ref 3.5–5.0)
Alkaline Phosphatase: 85 U/L (ref 38–126)
Anion gap: 8 (ref 5–15)
BUN: 39 mg/dL — ABNORMAL HIGH (ref 8–23)
CO2: 27 mmol/L (ref 22–32)
Calcium: 8.1 mg/dL — ABNORMAL LOW (ref 8.9–10.3)
Chloride: 99 mmol/L (ref 98–111)
Creatinine, Ser: 0.91 mg/dL (ref 0.44–1.00)
GFR, Estimated: >60 mL/min (ref >60)
Glucose, Bld: 205 mg/dL — ABNORMAL HIGH (ref 70–99)
Potassium: 4.4 mmol/L (ref 3.5–5.1)
Sodium: 134 mmol/L — ABNORMAL LOW (ref 135–145)
Total Bilirubin: 0.7 mg/dL (ref 0.3–1.2)
Total Protein: 6.5 g/dL (ref 6.5–8.1)

## 2023-05-03 LAB — RAPID URINE DRUG SCREEN, HOSP PERFORMED
Amphetamines: NOT DETECTED
Barbiturates: NOT DETECTED
Benzodiazepines: NOT DETECTED
Cocaine: NOT DETECTED
Opiates: NOT DETECTED
Tetrahydrocannabinol: NOT DETECTED

## 2023-05-03 LAB — URINALYSIS, ROUTINE W REFLEX MICROSCOPIC
Bilirubin Urine: NEGATIVE
Glucose, UA: 50 mg/dL — AB
Hgb urine dipstick: NEGATIVE
Ketones, ur: 20 mg/dL — AB
Leukocytes,Ua: NEGATIVE
Nitrite: NEGATIVE
Protein, ur: NEGATIVE mg/dL
Specific Gravity, Urine: 1.021 (ref 1.005–1.030)
pH: 5 (ref 5.0–8.0)

## 2023-05-03 LAB — I-STAT CG4 LACTIC ACID, ED: Lactic Acid, Venous: 1.5 mmol/L (ref 0.5–1.9)

## 2023-05-03 LAB — TROPONIN I (HIGH SENSITIVITY): Troponin I (High Sensitivity): 6562 ng/L (ref <18)

## 2023-05-03 LAB — HEMOGLOBIN A1C
Hgb A1c MFr Bld: 7.2 % — ABNORMAL HIGH (ref 4.8–5.6)
Mean Plasma Glucose: 159.94 mg/dL

## 2023-05-03 LAB — APTT: aPTT: 22 s — ABNORMAL LOW (ref 24–36)

## 2023-05-03 LAB — PROTIME-INR
INR: 1.2 (ref 0.8–1.2)
Prothrombin Time: 15.7 s — ABNORMAL HIGH (ref 11.4–15.2)

## 2023-05-03 SURGERY — CORONARY/GRAFT ACUTE MI REVASCULARIZATION
Anesthesia: LOCAL

## 2023-05-03 MED ORDER — DOCUSATE SODIUM 50 MG/5ML PO LIQD: 100.0000 mg | Freq: Two times a day (BID) | ORAL | Status: DC

## 2023-05-03 MED ORDER — ONDANSETRON HCL 4 MG/2ML IJ SOLN
4.0000 mg | Freq: Four times a day (QID) | INTRAMUSCULAR | Status: DC | PRN
  Administered 2023-05-06: 4 mg via INTRAVENOUS
  Filled 2023-05-03: qty 2

## 2023-05-03 MED ORDER — MIDAZOLAM HCL 2 MG/2ML IJ SOLN
1.0000 mg | INTRAMUSCULAR | Status: DC | PRN
  Administered 2023-05-06: 1 mg via INTRAVENOUS
  Administered 2023-05-06: 2 mg via INTRAVENOUS
  Administered 2023-05-06: 1 mg via INTRAVENOUS
  Filled 2023-05-03 (×3): qty 2

## 2023-05-03 MED ORDER — INSULIN ASPART 100 UNIT/ML IJ SOLN
0.0000 [IU] | INTRAMUSCULAR | Status: DC
  Administered 2023-05-04: 1 [IU] via SUBCUTANEOUS
  Administered 2023-05-04: 3 [IU] via SUBCUTANEOUS
  Administered 2023-05-04: 5 [IU] via SUBCUTANEOUS
  Administered 2023-05-04 (×2): 3 [IU] via SUBCUTANEOUS
  Administered 2023-05-05: 8 [IU] via SUBCUTANEOUS
  Administered 2023-05-05: 3 [IU] via SUBCUTANEOUS
  Administered 2023-05-05 (×2): 5 [IU] via SUBCUTANEOUS
  Administered 2023-05-05: 3 [IU] via SUBCUTANEOUS
  Administered 2023-05-05: 5 [IU] via SUBCUTANEOUS
  Administered 2023-05-06: 8 [IU] via SUBCUTANEOUS
  Administered 2023-05-06: 3 [IU] via SUBCUTANEOUS
  Administered 2023-05-06: 8 [IU] via SUBCUTANEOUS

## 2023-05-03 MED ORDER — FENTANYL BOLUS VIA INFUSION
25.0000 ug | INTRAVENOUS | Status: DC | PRN
  Administered 2023-05-04: 50 ug via INTRAVENOUS
  Administered 2023-05-04: 100 ug via INTRAVENOUS

## 2023-05-03 MED ORDER — PROPOFOL 1000 MG/100ML IV EMUL
0.0000 ug/kg/min | INTRAVENOUS | Status: DC
  Administered 2023-05-03: 10 ug/kg/min via INTRAVENOUS
  Filled 2023-05-03: qty 100

## 2023-05-03 MED ORDER — PROPOFOL 1000 MG/100ML IV EMUL: 0.0000 ug/kg/min | INTRAVENOUS | Status: DC

## 2023-05-03 MED ORDER — FENTANYL 2500MCG IN NS 250ML (10MCG/ML) PREMIX INFUSION
25.0000 ug/h | INTRAVENOUS | Status: DC
  Administered 2023-05-03: 50 ug/h via INTRAVENOUS
  Filled 2023-05-03: qty 250

## 2023-05-03 MED ORDER — ASPIRIN 81 MG PO CHEW
324.0000 mg | CHEWABLE_TABLET | ORAL | Status: AC
  Administered 2023-05-04: 324 mg
  Filled 2023-05-03: qty 4

## 2023-05-03 MED ORDER — SODIUM CHLORIDE 0.9 % IV SOLN: INTRAVENOUS | Status: DC

## 2023-05-03 MED ORDER — POLYETHYLENE GLYCOL 3350 17 G PO PACK: 17.0000 g | PACK | Freq: Every day | ORAL | Status: DC

## 2023-05-03 MED ORDER — POLYETHYLENE GLYCOL 3350 17 G PO PACK
17.0000 g | PACK | Freq: Every day | ORAL | Status: DC
  Administered 2023-05-04 – 2023-05-06 (×3): 17 g
  Filled 2023-05-03 (×3): qty 1

## 2023-05-03 MED ORDER — ASPIRIN 300 MG RE SUPP
300.0000 mg | RECTAL | Status: AC
  Filled 2023-05-03: qty 1

## 2023-05-03 MED ORDER — DOCUSATE SODIUM 50 MG/5ML PO LIQD: 100.0000 mg | Freq: Two times a day (BID) | ORAL | Status: DC | PRN

## 2023-05-03 MED ORDER — FENTANYL 2500MCG IN NS 250ML (10MCG/ML) PREMIX INFUSION: 25.0000 ug/h | INTRAVENOUS | Status: DC

## 2023-05-03 MED ORDER — FENTANYL CITRATE PF 50 MCG/ML IJ SOSY: 25.0000 ug | PREFILLED_SYRINGE | Freq: Once | INTRAMUSCULAR | Status: DC

## 2023-05-03 MED ORDER — DOCUSATE SODIUM 50 MG/5ML PO LIQD
100.0000 mg | Freq: Two times a day (BID) | ORAL | Status: DC
  Administered 2023-05-04 – 2023-05-06 (×6): 100 mg
  Filled 2023-05-03 (×6): qty 10

## 2023-05-03 MED ORDER — POLYETHYLENE GLYCOL 3350 17 G PO PACK: 17.0000 g | PACK | Freq: Every day | ORAL | Status: DC | PRN

## 2023-05-03 MED ORDER — FAMOTIDINE 20 MG PO TABS
20.0000 mg | ORAL_TABLET | Freq: Two times a day (BID) | ORAL | Status: DC
  Administered 2023-05-04 – 2023-05-06 (×6): 20 mg
  Filled 2023-05-03 (×6): qty 1

## 2023-05-03 NOTE — ED Triage Notes (Signed)
Pt BIB [facility] from home. Family found pt unresponsive around 2100 in her bed tonight after last seeing her normal around 12pm when she laid down to take a nap. When EMS arrived pt had shallow respirations and pinpoint pupils so gave 2mg  narcan. Narcan ineffective. 12 lead showed elevation in inferior leads, code STEMI called. Pt presents unresponsive at this time on NRB. Pt received approx 600 ml NS en route. MD to bedside immediately.

## 2023-05-03 NOTE — Progress Notes (Signed)
67 y/o female last seen normal by family reportedly at noon, then found obtunded at 9 PM. EMS were called, EKG showed inferior STEMI. Patient remains obtunded with normal blood pressure. AMS cannot be explained by cardiogenic shock, as she is not in shock based on blood pressures. Essentially, she has altered mental status of unclear etiology with EKG showing STEMI. Discussed with ER physician re: airway protection and stat head CT head. STEMI care including cardiac catheterization would not explain and improve her AMS. I would hold off cardiac catheterization at this time. Continue supportive care and management of AMS. Cardiology will follow.  Elder Negus, MD Interventional Cardiology

## 2023-05-03 NOTE — ED Notes (Signed)
Unable to complete multiple triage assessments due to pts GCS upon arrival to ED and pt now being intubated and sedated.

## 2023-05-03 NOTE — Consult Note (Incomplete)
Cardiology Consultation   Patient ID: Brandy Fisher MRN: [id number]; DOB: May 21, 1956  Admit date: May 11, 2023 Date of Consult: 05/11/23  PCP:  Gilmore Laroche, FNP   [facility] Providers Cardiologist:  None   { Click here to update MD or APP on Care Team, Refresh:1}     Patient Profile:   Brandy Fisher is a 67 y.o. 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 Karmen Stabs.  History of Present Illness:   History primarily taken from emergency medical services due to patient being obtunded.  Brandy Fisher was found down unresponsive at home around 9 pm today by family.  She was last seen normal at around 12 pm per report.  Emergency medical services was called to the home and it was reported she had shallow respirations and pinpoint pupils.  Patient did not respond to Narcan.  An ECG was performed that demonstrated inferoposterior STEMI.  Patient was transported to our facility for further management.  Patient remains obtunded and minimally responsive to stimuli.     Past Medical History:  Diagnosis Date   Arthritis    Cancer (HCC)    Throat cancer   Cough    GREEN SPUTUM    Diabetes mellitus    Type II   History of kidney stones    passed   Osteomyelitis of foot, left, acute (HCC) 11/09/2013    Past Surgical History:  Procedure Laterality Date   BREAST SURGERY Left    Left breast biopsy, negative   CESAREAN SECTION     CHOLECYSTECTOMY     HIP ARTHROPLASTY  03/16/2011   Procedure: Right ARTHROPLASTY BIPOLAR HIP;  Surgeon: Darreld Mclean;  Location: [facility];  Service: Orthopedics;  Laterality: Right;   LARYNGOSCOPY AND ESOPHAGOSCOPY N/A 07/29/2018   Procedure: DIRECT LARYNGOSCOPY AND ESOPHAGOSCOPY WITH BIOPSY;  Surgeon: Serena Colonel, MD;  Location: [facility] OR;  Service: ENT;  Laterality: N/A;   LUMBAR LAMINECTOMY/DECOMPRESSION MICRODISCECTOMY Left 05/16/2015   Procedure: Laminectomy and Foraminotomy - L4-L5 - left, resection of synovial cyst;  Surgeon: Tia Alert, MD;  Location: [facility];  Service: Neurosurgery;  Laterality: Left;  Laminectomy and Foraminotomy - L4-L5 - left, resection of synovial cyst     {Home Medications (Optional):21181}  Inpatient Medications: Scheduled Meds:  fentaNYL (SUBLIMAZE) injection  25 mcg Intravenous Once   Continuous Infusions:  sodium chloride     fentaNYL infusion INTRAVENOUS 50 mcg/hr (May 11, 2023 2225)   propofol (DIPRIVAN) infusion 10 mcg/kg/min (05-11-23 2225)   PRN Meds: fentaNYL  Allergies:    Allergies  Allergen Reactions   Morphine And Codeine Nausea And Vomiting   Tramadol Nausea Only    Social History:   Social History   Socioeconomic History   Marital status: Widowed    Spouse name: Not on file   Number of children: Not on file   Years of education: Not on file   Highest education level: Not on file  Occupational History   Not on file  Tobacco Use   Smoking status: Every Day    Current packs/day: 0.50    Average packs/day: 0.5 packs/day for 79.1 years (39.5 ttl pk-yrs)    Types: Cigarettes    Start date: 04/1969   Smokeless tobacco: Never   Tobacco comments:    1/2 ppd  Vaping Use   Vaping status: Never Used  Substance and Sexual Activity   Alcohol use: No   Drug use: No   Sexual activity: Not Currently  Other Topics Concern   Not on file  Social History Narrative   Not on file   Social Determinants of Health   Financial Resource Strain: Low Risk  (06/03/2022)   Received from [facility]   Overall Financial Resource Strain (CARDIA)    Difficulty of Paying Living Expenses: Not hard at all  Food Insecurity: No Food Insecurity (06/03/2022)   Received from [facility]   Hunger Vital Sign    Worried About Running Out of Food  in the Last Year: Never true    Ran Out of Food in the Last Year: Never true  Transportation Needs: No Transportation Needs (06/03/2022)   Received from [facility] - Transportation    Lack of Transportation (Medical): No    Lack of Transportation (Non-Medical): No  Physical Activity: Inactive (06/03/2022)   Received from [facility]   Exercise Vital Sign    Days of Exercise per Week: 0 days    Minutes of Exercise per Session: 0 min  Stress: No Stress Concern Present (06/03/2022)   Received from [facility] of Occupational Health - Occupational Stress Questionnaire    Feeling of Stress : Only a little  Social Connections: Socially Isolated (06/03/2022)   Received from [facility]   Social Connection and Isolation Panel [NHANES]    Frequency of Communication with Friends and Family: Once a week    Frequency of Social Gatherings with Friends and Family: Never    Attends Religious Services: Never    Database administrator or Organizations: No    Attends Banker Meetings: Never    Marital Status: Widowed  Intimate Partner Violence: Not At Risk (06/03/2022)   Received from [facility]   Humiliation, Afraid, Rape, and Kick questionnaire    Fear of Current or Ex-Partner: No    Emotionally Abused: No    Physically Abused: No    Sexually Abused: No    Family History:   Family History  Problem Relation Age of Onset   Diabetes Mother    Colon cancer Neg Hx      ROS:  Please see the history of present illness.  All other ROS reviewed and negative.     Physical Exam/Data:   Vitals:   05/01/2023 2209 04/06/2023 2217 04/18/2023 2302  BP: 118/70    Pulse: 89    Resp: 10    Temp: 97.9 F (36.6 C)    TempSrc: Axillary    Weight:   71 kg  Height:  5\' 7"  (1.702 m) 5\' 7"  (1.702 m)   No intake or output data in the 24 hours ending 04/24/2023 2318    04/25/2023   11:02 PM 04/09/2023    9:01 AM 07/31/2019    8:33 PM  Last 3  Weights  Weight (lbs) 156 lb 8.4 oz 157 lb 1.3 oz 119 lb 14.9 oz  Weight (kg) 71 kg 71.251 kg 54.4 kg     Body mass index is 24.52 kg/m.  General:  Well nourished, well developed, in no acute distress*** HEENT: normal Neck: no JVD Vascular: No carotid bruits; Distal pulses 2+ bilaterally Cardiac:  normal S1, S2; RRR; no murmur *** Lungs:  clear to auscultation bilaterally, no wheezing, rhonchi or rales  Abd: soft,  nontender, no hepatomegaly  Ext: no edema Musculoskeletal:  No deformities, BUE and BLE strength normal and equal Skin: warm and dry  Neuro:  CNs 2-12 intact, no focal abnormalities noted Psych:  Normal affect   EKG:  The EKG was personally reviewed and demonstrates:  05-08-23 (22:03):  Sinus rhythm; anterior and inferoposterior ST-elevations   Telemetry:  Telemetry was personally reviewed and demonstrates:  Sinus rhythm  Relevant CV Studies: None  Laboratory Data:  High Sensitivity Troponin:   Recent Labs  Lab May 08, 2023 2205  TROPONINIHS 6,562*     Chemistry Recent Labs  Lab 05/08/23 2205  NA 134*  K 4.4  CL 99  CO2 27  GLUCOSE 205*  BUN 39*  CREATININE 0.91  CALCIUM 8.1*  GFRNONAA >60  ANIONGAP 8    Recent Labs  Lab May 08, 2023 2205  PROT 6.5  ALBUMIN 2.7*  AST 32  ALT 44  ALKPHOS 85  BILITOT 0.7   Lipids  Recent Labs  Lab May 08, 2023 2205  CHOL 131  TRIG 131  HDL 16*  LDLCALC 89  CHOLHDL 8.2    Hematology Recent Labs  Lab May 08, 2023 2205  WBC 8.5  RBC 3.66*  HGB 11.2*  HCT 33.6*  MCV 91.8  MCH 30.6  MCHC 33.3  RDW 13.8  PLT 252   Thyroid No results for input(s): "TSH", "FREET4" in the last 168 hours.  BNPNo results for input(s): "BNP", "PROBNP" in the last 168 hours.  DDimer No results for input(s): "DDIMER" in the last 168 hours.   Radiology/Studies:  No results found.   Assessment and Plan:   STEMI-ACS: Patient found down at home unresponsive with ECG concerning for anterior and inferoposterior ST-elevations,  concerning for STEMI-ACS.  She is currently obtunded and examination is out proportion of what would be expected for acute coronary syndrome.  Interventional cardiology attending Dr. Rosemary Holms evaluated the patient and decided not to pursue cardiac catheterization at this time.  Patient is currently undergoing stat head CT to rule out an acute intracranial process for her altered mental status.   --Initiate heparin drip once stat head CT has ruled out possible intra-cranial bleed.  Treatment for 48 hours.   --If not contra-indication, give high-dose aspirin and initiate clopidogrel after 300 mg load. --Arrange for echocardiogram in the morning. --Start atrovastatin 40 mg daily and metoprolol tartrate 25 mg twice daily --Check lipid panel and A1c for further secondary prevention assessment.      Risk Assessment/Risk Scores:  {Complete the following score calculators/questions to meet required metrics.  Press F2         :[id number]}   TIMI Risk Score for ST  Elevation MI:   The patient's TIMI risk score is 6, which indicates a 16.1% risk of all cause mortality at 30 days.{ Click here to calculate score  REFRESH Note before signing :1}           For questions or updates, please contact [facility] Please consult [email or web address] for contact info under    Signed, Judie Grieve, MD  May 08, 2023 11:18 PM

## 2023-05-03 NOTE — ED Notes (Signed)
Clothing removed, pt in gown.

## 2023-05-03 NOTE — Progress Notes (Signed)
Transported pt from ED to ct on vent

## 2023-05-03 NOTE — ED Provider Notes (Signed)
[place] EMERGENCY DEPARTMENT AT [facility] Provider Note   CSN: [id number] Arrival date & time: 05/03/23  2203     History  Chief Complaint  Patient presents with   Altered Mental Status   Code STEMI    Brandy Fisher is a 67 y.o. female with PMH head and neck cancer getting chemo.   She was found poorly responsive by family at 54 PM tonight after last seen normal at 12 PM today when she laid down to take a nap. Normally fully functional.  When EMS arrived patient had shallow respirations and pinpoint pupils and was given 2 mg of Narcan, with no change in clinical status.  Twelve-lead EKG showed an inferior STEMI so code STEMI was called on arrival.  Patient had received 600 cc of normal saline with EMS prior to arrival.      Home Medications Prior to Admission medications   Medication Sig Start Date End Date Taking? Authorizing Provider  albuterol (PROVENTIL HFA;VENTOLIN HFA) 108 (90 Base) MCG/ACT inhaler Inhale 2 puffs into the lungs every 6 (six) hours as needed for wheezing or shortness of breath.     [provider]  clonazePAM (KLONOPIN) 1 MG tablet Take 1 mg by mouth at bedtime. 09/02/19   [provider]  cyclobenzaprine (FLEXERIL) 10 MG tablet Take 10 mg by mouth 3 (three) times daily as needed for muscle spasms. 10/16/19   [provider]  HYDROcodone-acetaminophen (NORCO) 7.5-325 MG tablet Take 1 tablet by mouth every 6 (six) hours as needed (pain.). 04/09/23   Gilmore Laroche, FNP  levothyroxine (SYNTHROID) 50 MCG tablet Take 50 mcg by mouth daily before breakfast. 04/22/20   [provider]  metFORMIN (GLUCOPHAGE) 1000 MG tablet Take 1 tablet (1,000 mg total) by mouth daily. 04/16/23   Gilmore Laroche, FNP  terbinafine (LAMISIL) 1 % cream Apply 1 Application topically 2 (two) times daily. 04/09/23   Gilmore Laroche, FNP  Vitamin D, Ergocalciferol, (DRISDOL) 1.25 MG (50000 UNIT) CAPS capsule Take 1 capsule (50,000 Units  total) by mouth every 7 (seven) days. 04/16/23   Gilmore Laroche, FNP      Allergies    Morphine and codeine and Tramadol    Review of Systems   Review of Systems as per HPI  Physical Exam Updated Vital Signs BP 118/70 (BP Location: Left Arm)   Pulse 89   Temp 97.9 F (36.6 C) (Axillary)   Resp 10   Ht 5\' 7"  (1.702 m)   Wt 71 kg   BMI 24.52 kg/m  Physical Exam Constitutional:      Comments: obtunded  HENT:     Head: Normocephalic and atraumatic.     Nose: Nose normal.     Mouth/Throat:     Mouth: Mucous membranes are dry.  Eyes:     Comments: Pinpoint pupils. No gaze deviation  Cardiovascular:     Rate and Rhythm: Normal rate and regular rhythm.     Pulses: Normal pulses.     Heart sounds: Normal heart sounds.  Pulmonary:     Effort: Pulmonary effort is normal.     Breath sounds: Normal breath sounds.  Abdominal:     General: There is no distension.     Palpations: Abdomen is soft.     Tenderness: There is no abdominal tenderness.  Musculoskeletal:     Cervical back: Neck supple.     Right lower leg: No edema.     Left lower leg: No edema.  Skin:  General: Skin is warm.     Capillary Refill: Capillary refill takes less than 2 seconds.  Neurological:     Comments: Moves all extremities to pain. GCS is 7.      ED Results / Procedures / Treatments   Labs (all labs ordered are listed, but only abnormal results are displayed) Labs Reviewed  HEMOGLOBIN A1C - Abnormal; Notable for the following components:      Result Value   Hgb A1c MFr Bld 7.2 (*)    All other components within normal limits  CBC WITH DIFFERENTIAL/PLATELET - Abnormal; Notable for the following components:   RBC 3.66 (*)    Hemoglobin 11.2 (*)    HCT 33.6 (*)    nRBC 1.2 (*)    Abs Immature Granulocytes 0.38 (*)    All other components within normal limits  PROTIME-INR - Abnormal; Notable for the following components:   Prothrombin Time 15.7 (*)    All other components within normal  limits  APTT - Abnormal; Notable for the following components:   aPTT 22 (*)    All other components within normal limits  COMPREHENSIVE METABOLIC PANEL - Abnormal; Notable for the following components:   Sodium 134 (*)    Glucose, Bld 205 (*)    BUN 39 (*)    Calcium 8.1 (*)    Albumin 2.7 (*)    All other components within normal limits  LIPID PANEL - Abnormal; Notable for the following components:   HDL 16 (*)    All other components within normal limits  URINALYSIS, ROUTINE W REFLEX MICROSCOPIC - Abnormal; Notable for the following components:   Color, Urine AMBER (*)    APPearance CLOUDY (*)    Glucose, UA 50 (*)    Ketones, ur 20 (*)    All other components within normal limits  TROPONIN I (HIGH SENSITIVITY) - Abnormal; Notable for the following components:   Troponin I (High Sensitivity) 6,562 (*)    All other components within normal limits  TRIGLYCERIDES  BLOOD GAS, ARTERIAL  RAPID URINE DRUG SCREEN, HOSP PERFORMED  I-STAT CG4 LACTIC ACID, ED    EKG EKG Interpretation Date/Time:  Monday May 03 2023 22:03:02 EDT Ventricular Rate:  103 PR Interval:  167 QRS Duration:  95 QT Interval:  341 QTC Calculation: 447 R Axis:   80  Text Interpretation: Sinus tachycardia Inferior infarct, acute (RCA) Lateral leads are also involved Probable RV involvement, suggest recording right precordial leads >>> Acute MI <<< STEMI called Confirmed by Melene Plan [phone]) on 05/03/2023 10:17:04 PM  Radiology No results found.  Procedures Procedure Name: Intubation Date/Time: 05/03/2023 11:11 PM  Performed by: Karmen Stabs, MDPre-anesthesia Checklist: Patient identified, Emergency Drugs available, Suction available, Timeout performed and Patient being monitored Oxygen Delivery Method: Non-rebreather mask Preoxygenation: Pre-oxygenation with 100% oxygen Induction Type: Rapid sequence Ventilation: Mask ventilation without difficulty Laryngoscope Size: Mac and 3 Grade View: Grade  I Tube size: 7.5 mm Number of attempts: 1 Airway Equipment and Method: Video-laryngoscopy Placement Confirmation: ETT inserted through vocal cords under direct vision, CO2 detector, Breath sounds checked- equal and bilateral and Positive ETCO2 Secured at: 22 cm Tube secured with: ETT holder Dental Injury: Teeth and Oropharynx as per pre-operative assessment         Medications Ordered in ED Medications  0.9 %  sodium chloride infusion (has no administration in time range)  fentaNYL (SUBLIMAZE) injection 25 mcg (has no administration in time range)  fentaNYL in NS (14mcg/ml) infusion-PREMIX (50 mcg/hr  Intravenous New Bag/Given 05/03/23 2225)  fentaNYL (SUBLIMAZE) bolus via infusion 25-100 mcg (has no administration in time range)  propofol (DIPRIVAN) 1000 MG/100ML infusion (10 mcg/kg/min  71 kg (Order-Specific) Intravenous New Bag/Given 05/03/23 2225)    ED Course/ Medical Decision Making/ A&P                                 Medical Decision Making Amount and/or Complexity of Data Reviewed Labs: ordered. Decision-making details documented in ED Course. Radiology: ordered and independent interpretation performed. Decision-making details documented in ED Course. ECG/medicine tests: ordered and independent interpretation performed. Decision-making details documented in ED Course.  Risk OTC drugs. Prescription drug management. Decision regarding hospitalization.   67 year old female presenting obtunded with STEMI called on EMS EKG.  On arrival she is hemodynamically stable although with poor GCS on 15 L nonrebreather.  She was intubated for airway protection with etomidate and rocuronium RSI without complication. ET tube placement confirmed by CXR. OGT placed and placement confirmed on KUB. Fentanyl and propofol sedation/analgesia post intubation. Patient was taken immediately for CT head without contrast to rule out stroke or ICH and this showed no obvious  abnormality on my read; though pending formal radiology read. Labs including CBC, CMP, coags, trop were sent and majority in process at time of admission; though trop has resulted in 6000s. EKG showed sinus tachycardia 103 with ST segment elevations in II, III, aVF concerning for STEMI on arrival. Discussed with cardiologist. They have recommended heparin and ASA if CT head negative; and ICU admission and they will follow; will not take to cath lab given unknown time frame (possibly 9 hours into STEMI). Patient was then admitted to the ICU for further and definitive care.   While awaiting intensivist evaluation in ED, family arrives and states patient was otherwise completely normal today until noon. They confirm no chest pain, SOB, fevers, recent illnesses, or any complaints by the patient recently.      Oncoming ED team made aware of patient situation for situational awareness and patient safety while she was awaiting transport to ICU.           Final Clinical Impression(s) / ED Diagnoses Final diagnoses:  Altered mental status, unspecified altered mental status type    Rx / DC Orders ED Discharge Orders     None         Karmen Stabs, MD 05/03/23 2322    Melene Plan, DO 05/03/23 2330

## 2023-05-03 NOTE — Progress Notes (Signed)
Intubation Procedure Note  Brandy Fisher  [id number]  04-Feb-1956  Date:05/03/23  Time:10:44 PM   Provider Performing:Kimbria Camposano A Cha Gomillion    Procedure: Assisted with Intubation  Indication(s) Respiratory Fail   Time Out Verified patient identification, verified procedure, site/side was marked, verified correct patient position, special equipment/implants available, medications/allergies/relevant history reviewed, required imaging and test results available.   Sterile Technique Usual hand hygeine, masks, and gloves were used   Procedure Description Patient positioned in bed supine.  Sedation given as noted above.  Patient was intubated with endotracheal tube using Glidescope.  View was Grade 1 full glottis .  Number of attempts was 1.  Colorimetric CO2 detector was consistent with tracheal placement.   Complications None; patient tolerated the procedure well. Chest X-ray is ordered to verify placement.

## 2023-05-03 NOTE — H&P (Signed)
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  She was found unresponsive around 2100 by family after last being seen normal around 1200 at which time she was laying down for a nap.  Shallow respirations noted.  Unresponsive to Narcan.  ECG performed that showed inferoposterior ST elevations.  She was intubated for airway protection in the ED and has been seen by interventional cardiology.  No urgent cardiac catheterization is planned, aspirin, heparin ordered pending head CT results.  Troponin 6562.  Chest x-ray reviewed by me, clear without any infiltrates, ET tube in good position.   Pertinent  Medical History   Past Medical History:  Diagnosis Date  . Arthritis   . Cancer (HCC)    Throat cancer  . Cough    GREEN SPUTUM   . Diabetes mellitus    Type II  . History of kidney stones    passed  . Osteomyelitis of foot, left, acute (HCC) 11/09/2013    Significant Hospital Events: Including procedures, antibiotic start and stop dates in addition to other pertinent events   Head CT 11/28>  Chest x-ray 11/28 > clear  Interim History / Subjective:    Objective   Blood pressure 118/70, pulse 89, temperature 97.9 F (36.6 C), temperature source Axillary, resp. rate 10, height 5\' 7"  (1.702 m), weight 71 kg.    Vent Mode: PRVC FiO2 (%):  [100 %] 100 % Set Rate:  [15 bmp] 15 bmp Vt Set:  [490 mL] 490 mL PEEP:  [5 cmH20] 5 cmH20  No intake or output data in the 24 hours ending May 09, 2023 2322 Filed Weights   May 09, 2023 2302  Weight: 71 kg    Examination: General: *** HENT: *** Lungs: *** Cardiovascular:  *** Abdomen: *** Extremities: *** Neuro: *** GU: ***  Resolved Hospital Problem list   ***  Assessment & Plan:  ***  Best Practice (right click and "Reselect all SmartList Selections" daily)   Diet/type: {diet type:25684} DVT prophylaxis: {anticoagulation (Optional):25687} GI prophylaxis: {QQ:59563} Lines: {Central Venous Access:25771} Foley:  {Central Venous Access:25691} Code Status:  {Code Status:26939} Last date of multidisciplinary goals of care discussion [***]  Labs   CBC: Recent Labs  Lab 09-May-2023 2205  WBC 8.5  NEUTROABS 5.6  HGB 11.2*  HCT 33.6*  MCV 91.8  PLT 252    Basic Metabolic Panel: Recent Labs  Lab 2023/05/09 2205  NA 134*  K 4.4  CL 99  CO2 27  GLUCOSE 205*  BUN 39*  CREATININE 0.91  CALCIUM 8.1*   GFR: Estimated Creatinine Clearance: 56.7 mL/min (by C-G formula based on SCr of 0.91 mg/dL). Recent Labs  Lab 2023/05/09 2205 09-May-2023 2218  WBC 8.5  --   LATICACIDVEN  --  1.5    Liver Function Tests: Recent Labs  Lab 05-09-23 2205  AST 32  ALT 44  ALKPHOS 85  BILITOT 0.7  PROT 6.5  ALBUMIN 2.7*   No results for input(s): "LIPASE", "AMYLASE" in the last 168 hours. No results for input(s): "AMMONIA" in the last 168 hours.  ABG No results found for: "PHART", "PCO2ART", "PO2ART", "HCO3", "TCO2", "ACIDBASEDEF", "O2SAT"   Coagulation Profile: Recent Labs  Lab 09-May-2023 2205  INR 1.2  Cardiac Enzymes: No results for input(s): "CKTOTAL", "CKMB", "CKMBINDEX", "TROPONINI" in the last 168 hours.  HbA1C: Hgb A1c MFr Bld  Date/Time Value Ref Range Status  2023-05-11 10:05 PM 7.2 (H) 4.8 - 5.6 % Final    Comment:    (NOTE) Pre diabetes:          5.7%-6.4%  Diabetes:              >6.4%  Glycemic control for   <7.0% adults with diabetes   04/09/2023 10:20 AM 6.7 (H) 4.8 - 5.6 % Final    Comment:             Prediabetes: 5.7 - 6.4          Diabetes: >6.4          Glycemic control for adults with diabetes: <7.0      CBG: No results for input(s): "GLUCAP" in the last 168 hours.  Review of Systems:   ***  Past Medical History:  She,  has a past medical history of Arthritis, Cancer (HCC), Cough, Diabetes mellitus, History of kidney stones, and Osteomyelitis of foot, left, acute (HCC) (11/09/2013).   Surgical History:   Past Surgical History:  Procedure Laterality Date  . BREAST SURGERY Left    Left breast biopsy, negative  . CESAREAN SECTION    . CHOLECYSTECTOMY    . HIP ARTHROPLASTY  03/16/2011   Procedure: Right ARTHROPLASTY BIPOLAR HIP;  Surgeon: Darreld Mclean;  Location: [facility];  Service: Orthopedics;  Laterality: Right;  . LARYNGOSCOPY AND ESOPHAGOSCOPY N/A 07/29/2018   Procedure: DIRECT LARYNGOSCOPY AND ESOPHAGOSCOPY WITH BIOPSY;  Surgeon: Serena Colonel, MD;  Location: [facility];  Service: ENT;  Laterality: N/A;  . LUMBAR LAMINECTOMY/DECOMPRESSION MICRODISCECTOMY Left 05/16/2015   Procedure: Laminectomy and Foraminotomy - L4-L5 - left, resection of synovial cyst;  Surgeon: Tia Alert, MD;  Location: [facility];  Service: Neurosurgery;  Laterality: Left;  Laminectomy and Foraminotomy - L4-L5 - left, resection of synovial cyst     Social History:   reports that she has been smoking cigarettes. She started smoking about 54 years ago. She has a 39.5 pack-year smoking history. She has never used smokeless tobacco. She reports that she does not drink alcohol and does not use drugs.   Family History:  Her family history includes Diabetes in her mother. There is no history of Colon cancer.   Allergies Allergies  Allergen Reactions  . Morphine And Codeine Nausea And Vomiting  . Tramadol Nausea Only     Home Medications  Prior to Admission medications   Medication Sig Start Date End Date Taking? Authorizing Provider  albuterol (PROVENTIL HFA;VENTOLIN HFA) 108 (90 Base) MCG/ACT inhaler Inhale 2 puffs into the lungs every 6 (six) hours as needed for wheezing or shortness of breath.      [provider]  clonazePAM (KLONOPIN) 1 MG tablet Take 1 mg by mouth at bedtime. 09/02/19   [provider]  cyclobenzaprine (FLEXERIL) 10 MG tablet Take 10 mg by mouth 3 (three) times daily as needed for muscle spasms. 10/16/19   [provider]  HYDROcodone-acetaminophen (NORCO) 7.5-325 MG tablet Take 1 tablet by mouth every 6 (six) hours as needed (pain.). 04/09/23   Gilmore Laroche, FNP  levothyroxine (SYNTHROID) 50 MCG tablet Take 50 mcg by mouth daily before breakfast. 04/22/20   [provider]  metFORMIN (GLUCOPHAGE) 1000 MG tablet Take 1 tablet (1,000 mg total) by mouth daily. 04/16/23   Gilmore Laroche, FNP  terbinafine (LAMISIL)  1 % cream Apply 1 Application topically 2 (two) times daily. 04/09/23   Gilmore Laroche, FNP  Vitamin D, Ergocalciferol, (DRISDOL) 1.25 MG (50000 UNIT) CAPS capsule Take 1 capsule (50,000 Units total) by mouth every 7 (seven) days. 04/16/23   Gilmore Laroche, FNP     Critical care time: ***      Levy Pupa, MD, PhD 05-26-23, 11:22 PM [facility] [phone] or if no answer before 7:00PM call [phone] For any issues after 7:00PM please call eLink [phone]

## 2023-05-04 ENCOUNTER — Encounter (HOSPITAL_COMMUNITY): Payer: Self-pay | Admitting: Emergency Medicine

## 2023-05-04 ENCOUNTER — Other Ambulatory Visit: Payer: Self-pay

## 2023-05-04 ENCOUNTER — Inpatient Hospital Stay (HOSPITAL_COMMUNITY): Payer: 59

## 2023-05-04 DIAGNOSIS — R0689 Other abnormalities of breathing: Secondary | ICD-10-CM

## 2023-05-04 DIAGNOSIS — G9340 Encephalopathy, unspecified: Secondary | ICD-10-CM | POA: Diagnosis not present

## 2023-05-04 DIAGNOSIS — I2489 Other forms of acute ischemic heart disease: Secondary | ICD-10-CM | POA: Diagnosis not present

## 2023-05-04 DIAGNOSIS — I2119 ST elevation (STEMI) myocardial infarction involving other coronary artery of inferior wall: Secondary | ICD-10-CM

## 2023-05-04 DIAGNOSIS — I219 Acute myocardial infarction, unspecified: Secondary | ICD-10-CM

## 2023-05-04 DIAGNOSIS — L8990 Pressure ulcer of unspecified site, unspecified stage: Secondary | ICD-10-CM | POA: Insufficient documentation

## 2023-05-04 DIAGNOSIS — J9600 Acute respiratory failure, unspecified whether with hypoxia or hypercapnia: Secondary | ICD-10-CM | POA: Diagnosis not present

## 2023-05-04 LAB — BASIC METABOLIC PANEL
Anion gap: 10 (ref 5–15)
BUN: 37 mg/dL — ABNORMAL HIGH (ref 8–23)
CO2: 25 mmol/L (ref 22–32)
Calcium: 8.2 mg/dL — ABNORMAL LOW (ref 8.9–10.3)
Chloride: 99 mmol/L (ref 98–111)
Creatinine, Ser: 0.87 mg/dL (ref 0.44–1.00)
GFR, Estimated: >60 mL/min (ref >60)
Glucose, Bld: 160 mg/dL — ABNORMAL HIGH (ref 70–99)
Potassium: 3.8 mmol/L (ref 3.5–5.1)
Sodium: 134 mmol/L — ABNORMAL LOW (ref 135–145)

## 2023-05-04 LAB — POCT I-STAT 7, (LYTES, BLD GAS, ICA,H+H)
Acid-Base Excess: 0 mmol/L (ref 0.0–2.0)
Bicarbonate: 25.2 mmol/L (ref 20.0–28.0)
Calcium, Ion: 1.13 mmol/L — ABNORMAL LOW (ref 1.15–1.40)
HCT: 31 % — ABNORMAL LOW (ref 36.0–46.0)
Hemoglobin: 10.5 g/dL — ABNORMAL LOW (ref 12.0–15.0)
O2 Saturation: 97 %
Patient temperature: 98.5
Potassium: 3.8 mmol/L (ref 3.5–5.1)
Sodium: 135 mmol/L (ref 135–145)
TCO2: 27 mmol/L (ref 22–32)
pCO2 arterial: 44.8 mm[Hg] (ref 32–48)
pH, Arterial: 7.359 (ref 7.35–7.45)
pO2, Arterial: 90 mm[Hg] (ref 83–108)

## 2023-05-04 LAB — CBC
HCT: 33.2 % — ABNORMAL LOW (ref 36.0–46.0)
Hemoglobin: 10.8 g/dL — ABNORMAL LOW (ref 12.0–15.0)
MCH: 29.7 pg (ref 26.0–34.0)
MCHC: 32.5 g/dL (ref 30.0–36.0)
MCV: 91.2 fL (ref 80.0–100.0)
Platelets: 234 10*3/uL (ref 150–400)
RBC: 3.64 MIL/uL — ABNORMAL LOW (ref 3.87–5.11)
RDW: 13.8 % (ref 11.5–15.5)
WBC: 9.7 10*3/uL (ref 4.0–10.5)
nRBC: 1.3 % — ABNORMAL HIGH (ref 0.0–0.2)

## 2023-05-04 LAB — TRIGLYCERIDES: Triglycerides: 144 mg/dL (ref <150)

## 2023-05-04 LAB — ECHOCARDIOGRAM COMPLETE
AR max vel: 1.66 cm2
AV Peak grad: 4.1 mm[Hg]
Ao pk vel: 1.01 m/s
Area-P 1/2: 4.86 cm2
Est EF: <20
Height: 67 in
MV M vel: 3.53 m/s
MV Peak grad: 49.8 mm[Hg]
S' Lateral: 4.8 cm
Weight: 2430.35 [oz_av]

## 2023-05-04 LAB — MAGNESIUM
Magnesium: 2.4 mg/dL (ref 1.7–2.4)
Magnesium: 2.2 mg/dL (ref 1.7–2.4)

## 2023-05-04 LAB — HEPARIN LEVEL (UNFRACTIONATED)
Heparin Unfractionated: 0.11 [IU]/mL — ABNORMAL LOW (ref 0.30–0.70)
Heparin Unfractionated: 0.13 [IU]/mL — ABNORMAL LOW (ref 0.30–0.70)

## 2023-05-04 LAB — GLUCOSE, CAPILLARY
Glucose-Capillary: 106 mg/dL — ABNORMAL HIGH (ref 70–99)
Glucose-Capillary: 109 mg/dL — ABNORMAL HIGH (ref 70–99)
Glucose-Capillary: 122 mg/dL — ABNORMAL HIGH (ref 70–99)
Glucose-Capillary: 144 mg/dL — ABNORMAL HIGH (ref 70–99)
Glucose-Capillary: 151 mg/dL — ABNORMAL HIGH (ref 70–99)
Glucose-Capillary: 158 mg/dL — ABNORMAL HIGH (ref 70–99)
Glucose-Capillary: 159 mg/dL — ABNORMAL HIGH (ref 70–99)
Glucose-Capillary: 208 mg/dL — ABNORMAL HIGH (ref 70–99)

## 2023-05-04 LAB — TROPONIN I (HIGH SENSITIVITY): Troponin I (High Sensitivity): 7214 ng/L (ref <18)

## 2023-05-04 LAB — AMMONIA: Ammonia: 16 umol/L (ref 9–35)

## 2023-05-04 LAB — PHOSPHORUS
Phosphorus: 3.2 mg/dL (ref 2.5–4.6)
Phosphorus: 3 mg/dL (ref 2.5–4.6)

## 2023-05-04 LAB — TSH: TSH: 3.847 u[IU]/mL (ref 0.350–4.500)

## 2023-05-04 LAB — HIV ANTIBODY (ROUTINE TESTING W REFLEX): HIV Screen 4th Generation wRfx: NONREACTIVE

## 2023-05-04 LAB — MRSA NEXT GEN BY PCR, NASAL: MRSA by PCR Next Gen: NOT DETECTED

## 2023-05-04 MED ORDER — HEPARIN (PORCINE) 25000 UT/250ML-% IV SOLN
1600.0000 [IU]/h | INTRAVENOUS | Status: DC
  Administered 2023-05-04: 800 [IU]/h via INTRAVENOUS
  Administered 2023-05-05: 1500 [IU]/h via INTRAVENOUS
  Administered 2023-05-05: 1200 [IU]/h via INTRAVENOUS
  Administered 2023-05-06: 1600 [IU]/h via INTRAVENOUS
  Filled 2023-05-04 (×4): qty 250

## 2023-05-04 MED ORDER — CHLORHEXIDINE GLUCONATE CLOTH 2 % EX PADS
6.0000 | MEDICATED_PAD | Freq: Every day | CUTANEOUS | Status: DC
  Administered 2023-05-04 – 2023-05-06 (×3): 6 via TOPICAL

## 2023-05-04 MED ORDER — ORAL CARE MOUTH RINSE: 15.0000 mL | OROMUCOSAL | Status: DC | PRN

## 2023-05-04 MED ORDER — HEPARIN BOLUS VIA INFUSION
2000.0000 [IU] | Freq: Once | INTRAVENOUS | Status: AC
  Administered 2023-05-04: 2000 [IU] via INTRAVENOUS
  Filled 2023-05-04: qty 2000

## 2023-05-04 MED ORDER — HEPARIN BOLUS VIA INFUSION
1500.0000 [IU] | Freq: Once | INTRAVENOUS | Status: AC
  Administered 2023-05-04: 1500 [IU] via INTRAVENOUS
  Filled 2023-05-04: qty 1500

## 2023-05-04 MED ORDER — ETOMIDATE 2 MG/ML IV SOLN
20.0000 mg | Freq: Once | INTRAVENOUS | Status: AC
  Administered 2023-05-03: 20 mg via INTRAVENOUS

## 2023-05-04 MED ORDER — SODIUM CHLORIDE 0.9% FLUSH
10.0000 mL | Freq: Two times a day (BID) | INTRAVENOUS | Status: DC
  Administered 2023-05-04 (×3): 10 mL
  Administered 2023-05-05: 20 mL
  Administered 2023-05-05: 10 mL
  Administered 2023-05-06: 20 mL

## 2023-05-04 MED ORDER — HEPARIN BOLUS VIA INFUSION
3000.0000 [IU] | Freq: Once | INTRAVENOUS | Status: AC
  Administered 2023-05-04: 3000 [IU] via INTRAVENOUS
  Filled 2023-05-04: qty 3000

## 2023-05-04 MED ORDER — ROCURONIUM BROMIDE 10 MG/ML (PF) SYRINGE
80.0000 mg | PREFILLED_SYRINGE | Freq: Once | INTRAVENOUS | Status: AC
  Administered 2023-05-03: 80 mg via INTRAVENOUS

## 2023-05-04 MED ORDER — ROSUVASTATIN CALCIUM 20 MG PO TABS
20.0000 mg | ORAL_TABLET | Freq: Every day | ORAL | Status: DC
  Administered 2023-05-04 – 2023-05-06 (×3): 20 mg
  Filled 2023-05-04 (×3): qty 1

## 2023-05-04 MED ORDER — IPRATROPIUM-ALBUTEROL 0.5-2.5 (3) MG/3ML IN SOLN
3.0000 mL | RESPIRATORY_TRACT | Status: DC | PRN
  Administered 2023-05-04 – 2023-05-06 (×2): 3 mL via RESPIRATORY_TRACT
  Filled 2023-05-04 (×2): qty 3

## 2023-05-04 MED ORDER — ASPIRIN 81 MG PO CHEW
81.0000 mg | CHEWABLE_TABLET | Freq: Every day | ORAL | Status: DC
  Administered 2023-05-04 – 2023-05-06 (×3): 81 mg
  Filled 2023-05-04 (×3): qty 1

## 2023-05-04 MED ORDER — VITAL AF 1.2 CAL PO LIQD
1000.0000 mL | ORAL | Status: DC
  Administered 2023-05-04 – 2023-05-05 (×2): 1000 mL

## 2023-05-04 MED ORDER — ALTEPLASE 2 MG IJ SOLR
INTRAMUSCULAR | Status: AC
  Administered 2023-05-04: 2 mg
  Filled 2023-05-04: qty 2

## 2023-05-04 MED ORDER — LEVOTHYROXINE SODIUM 50 MCG PO TABS
50.0000 ug | ORAL_TABLET | Freq: Every day | ORAL | Status: DC
  Administered 2023-05-05 – 2023-05-06 (×2): 50 ug
  Filled 2023-05-04 (×2): qty 1

## 2023-05-04 MED ORDER — ALTEPLASE 2 MG IJ SOLR: 2.0000 mg | Freq: Once | INTRAMUSCULAR | Status: AC

## 2023-05-04 MED ORDER — ORAL CARE MOUTH RINSE
15.0000 mL | OROMUCOSAL | Status: DC
  Administered 2023-05-04 – 2023-05-06 (×31): 15 mL via OROMUCOSAL

## 2023-05-04 MED ORDER — PERFLUTREN LIPID MICROSPHERE
1.0000 mL | INTRAVENOUS | Status: AC | PRN
  Administered 2023-05-04: 4 mL via INTRAVENOUS

## 2023-05-04 MED ORDER — SODIUM CHLORIDE 0.9% FLUSH: 10.0000 mL | INTRAVENOUS | Status: DC | PRN

## 2023-05-04 MED ORDER — HEPARIN BOLUS VIA INFUSION
3000.0000 [IU] | Freq: Once | INTRAVENOUS | Status: DC
  Filled 2023-05-04: qty 3000

## 2023-05-04 NOTE — Progress Notes (Signed)
Echocardiogram 2D Echocardiogram has been performed.  Lucendia Herrlich 05/04/2023, 11:23 AM

## 2023-05-04 NOTE — Plan of Care (Signed)

## 2023-05-04 NOTE — Progress Notes (Signed)
PHARMACY - ANTICOAGULATION CONSULT NOTE  Pharmacy Consult for heparin Indication:  ACS/STEMI  Allergies  Allergen Reactions   Morphine And Codeine Nausea And Vomiting   Tramadol Nausea Only    Patient Measurements: Height: 5\' 7"  (170.2 cm) Weight: 68.9 kg (151 lb 14.4 oz) IBW/kg (Calculated) : 61.6  Vital Signs: Temp: 97.7 F (36.5 C) (10/29 0112) Temp Source: Axillary (10/29 0112) BP: 86/53 (10/29 0300) Pulse Rate: 96 (10/29 0100)  Labs: Recent Labs    05/03/23 2205 05/03/23 2342  HGB 11.2* 10.9*  HCT 33.6* 32.0*  PLT 252  --   APTT 22*  --   LABPROT 15.7*  --   INR 1.2  --   CREATININE 0.91  --   TROPONINIHS 6,562*  --     Estimated Creatinine Clearance: 56.7 mL/min (by C-G formula based on SCr of 0.91 mg/dL).   Medical History: Past Medical History:  Diagnosis Date   Arthritis    Cancer (HCC)    Throat cancer   Cough    GREEN SPUTUM    Diabetes mellitus    Type II   History of kidney stones    passed   Osteomyelitis of foot, left, acute (HCC) 11/09/2013    Medications:  Medications Prior to Admission  Medication Sig Dispense Refill Last Dose   albuterol (PROVENTIL HFA;VENTOLIN HFA) 108 (90 Base) MCG/ACT inhaler Inhale 2 puffs into the lungs every 6 (six) hours as needed for wheezing or shortness of breath.       clonazePAM (KLONOPIN) 1 MG tablet Take 1 mg by mouth at bedtime.      cyclobenzaprine (FLEXERIL) 10 MG tablet Take 10 mg by mouth 3 (three) times daily as needed for muscle spasms.      HYDROcodone-acetaminophen (NORCO) 7.5-325 MG tablet Take 1 tablet by mouth every 6 (six) hours as needed (pain.). 20 tablet 0    levothyroxine (SYNTHROID) 50 MCG tablet Take 50 mcg by mouth daily before breakfast.      metFORMIN (GLUCOPHAGE) 1000 MG tablet Take 1 tablet (1,000 mg total) by mouth daily. 90 tablet 1    terbinafine (LAMISIL) 1 % cream Apply 1 Application topically 2 (two) times daily. 30 g 0    Vitamin D, Ergocalciferol, (DRISDOL) 1.25 MG (50000  UNIT) CAPS capsule Take 1 capsule (50,000 Units total) by mouth every 7 (seven) days. 20 capsule 1    Scheduled:   Chlorhexidine Gluconate Cloth  6 each Topical Q0600   docusate  100 mg Per Tube BID   famotidine  20 mg Per Tube BID   fentaNYL (SUBLIMAZE) injection  25 mcg Intravenous Once   insulin aspart  0-15 Units Subcutaneous Q4H   mouth rinse  15 mL Mouth Rinse Q2H   polyethylene glycol  17 g Per Tube Daily   sodium chloride flush  10-40 mL Intracatheter Q12H   Infusions:   fentaNYL infusion INTRAVENOUS 75 mcg/hr (05/04/23 0100)   propofol (DIPRIVAN) infusion 10 mcg/kg/min (05/04/23 0100)    Assessment: 67yo female was found unresponsive at home, EKG showed STEMI which could not explain the acute AMS, cardiac cath and heparin therapy deferred until head CT confirmed no acute abnormality >> now cleared to start heparin.  Goal of Therapy:  Heparin level 0.3-0.7 units/ml Monitor platelets by anticoagulation protocol: Yes   Plan:  Heparin 3000 units IV bolus x1 followed by infusion at 800 units/hr. Monitor heparin levels and CBC.  Vernard Gambles, PharmD, BCPS  05/04/2023,3:23 AM

## 2023-05-04 NOTE — Progress Notes (Signed)
Initial Nutrition Assessment  DOCUMENTATION CODES:   Non-severe (moderate) malnutrition in context of chronic illness  INTERVENTION:   Tube Feeding via OG:  Vital AF 1.2 at 60 ml/hr Begin TF at rate of 20 ml/hr; titrate by 10 mL q 6 hours until goal rate of 60 ml/hr TF at goal rate provides 1728 kcals, 108 g of protein and 1166 mL of free water   Small amount of additional lipid calories being provided via Propofol  Recommend resuming Vitamin D supplementation of ergocalciferol 50,000 units q 7 days once extubated  NUTRITION DIAGNOSIS:   Moderate Malnutrition related to chronic illness as evidenced by estimated needs.  GOAL:   Patient will meet greater than or equal to 90% of their needs  MONITOR:   Vent status, Labs, TF tolerance, Weight trends  REASON FOR ASSESSMENT:   Consult, Ventilator Enteral/tube feeding initiation and management  ASSESSMENT:   67 yo female admitted after being found unresponsive at home with no clear etiology. +STEMI, intubated for airway protection. PMH includes stage IVa pharyngeal squamous cell carcinoma (treated with chemo/radiation in 2020-on observation), DM, tobacco use, chronic narcotic use  10/28 Admitted, Intubated  Pt current on vent support Propofol: 4.3 ml/hr (114 kcals in 24 hours at current rate)  OG tube in mid stomach per abd xray   Noted recent diagnosis of Vit D deficiency; started on Ergocalciferol 50,000 units every 7 days on 04/16/23  Micronutrient Labs:  Vitamin D (04/09/23): 24.4 (L)-checked as outpatient  Unable to obtain diet and weight history from patient at this time.  Current wt 68.9 kg  Labs: phosphorus 3.2 (wdl), sodium 135 (wdl), Creatinine wdl, BUN 37 (H) Meds: colace, miralax, ss novolog  NUTRITION - FOCUSED PHYSICAL EXAM:  Flowsheet Row Most Recent Value  Orbital Region Moderate depletion  Upper Arm Region Moderate depletion  Thoracic and Lumbar Region Unable to assess  Buccal Region Unable to  assess  Temple Region Moderate depletion  Clavicle Bone Region Moderate depletion  Clavicle and Acromion Bone Region Moderate depletion  Scapular Bone Region Moderate depletion  Dorsal Hand Unable to assess  Patellar Region Mild depletion  Anterior Thigh Region Mild depletion  Posterior Calf Region Moderate depletion  Edema (RD Assessment) None       Diet Order:   Diet Order             Diet NPO time specified  Diet effective now                   EDUCATION NEEDS:   Not appropriate for education at this time  Skin:  Skin Assessment: Skin Integrity Issues: Skin Integrity Issues:: Stage I Stage I: buttocks  Last BM:  PTA, abdomen soft, BS present  Height:   Ht Readings from Last 1 Encounters:  05/03/23 5\' 7"  (1.702 m)    Weight:   Wt Readings from Last 1 Encounters:  05/04/23 68.9 kg    BMI:  Body mass index is 23.79 kg/m.  Estimated Nutritional Needs:   Kcal:  1650-1850 kcals  Protein:  85-105 g  Fluid:  >/= 1.7L    Romelle Starcher MS, RDN, LDN, CNSC Registered Dietitian 3 Clinical Nutrition RD Pager and On-Call Pager Number Located in [place]

## 2023-05-04 NOTE — Progress Notes (Signed)
PHARMACY - ANTICOAGULATION CONSULT NOTE  Pharmacy Consult for heparin Indication:  ACS/STEMI  Allergies  Allergen Reactions   Morphine And Codeine Nausea And Vomiting   Tramadol Nausea Only    Patient Measurements: Height: 5\' 7"  (170.2 cm) Weight: 68.9 kg (151 lb 14.4 oz) IBW/kg (Calculated) : 61.6  Vital Signs: Temp: 98.4 F (36.9 C) (10/29 1137) Temp Source: Oral (10/29 1137) BP: 107/65 (10/29 1100) Pulse Rate: 100 (10/29 0748)  Labs: Recent Labs    05/03/23 2205 05/03/23 2342 05/04/23 0302 05/04/23 0441 05/04/23 1100  HGB 11.2* 10.9* 10.8* 10.5*  --   HCT 33.6* 32.0* 33.2* 31.0*  --   PLT 252  --  234  --   --   APTT 22*  --   --   --   --   LABPROT 15.7*  --   --   --   --   INR 1.2  --   --   --   --   HEPARINUNFRC  --   --   --   --  0.11*  CREATININE 0.91  --  0.87  --   --   TROPONINIHS 2,956*  --  7,214*  --   --     Estimated Creatinine Clearance: 59.3 mL/min (by C-G formula based on SCr of 0.87 mg/dL).   Medical History: Past Medical History:  Diagnosis Date   Arthritis    Cancer (HCC)    Throat cancer   Cough    GREEN SPUTUM    Diabetes mellitus    Type II   History of kidney stones    passed   Osteomyelitis of foot, left, acute (HCC) 11/09/2013    Medications:  Medications Prior to Admission  Medication Sig Dispense Refill Last Dose   albuterol (PROVENTIL HFA;VENTOLIN HFA) 108 (90 Base) MCG/ACT inhaler Inhale 2 puffs into the lungs every 6 (six) hours as needed for wheezing or shortness of breath.       clonazePAM (KLONOPIN) 1 MG tablet Take 1 mg by mouth at bedtime.      cyclobenzaprine (FLEXERIL) 10 MG tablet Take 10 mg by mouth 3 (three) times daily as needed for muscle spasms.      HYDROcodone-acetaminophen (NORCO) 7.5-325 MG tablet Take 1 tablet by mouth every 6 (six) hours as needed (pain.). 20 tablet 0    levothyroxine (SYNTHROID) 50 MCG tablet Take 50 mcg by mouth daily before breakfast.      metFORMIN (GLUCOPHAGE) 1000 MG tablet  Take 1 tablet (1,000 mg total) by mouth daily. 90 tablet 1    terbinafine (LAMISIL) 1 % cream Apply 1 Application topically 2 (two) times daily. 30 g 0    Vitamin D, Ergocalciferol, (DRISDOL) 1.25 MG (50000 UNIT) CAPS capsule Take 1 capsule (50,000 Units total) by mouth every 7 (seven) days. 20 capsule 1    Scheduled:   aspirin  81 mg Per Tube Daily   Chlorhexidine Gluconate Cloth  6 each Topical Q0600   docusate  100 mg Per Tube BID   famotidine  20 mg Per Tube BID   insulin aspart  0-15 Units Subcutaneous Q4H   [START ON 05/05/2023] levothyroxine  50 mcg Per Tube QAC breakfast   mouth rinse  15 mL Mouth Rinse Q2H   polyethylene glycol  17 g Per Tube Daily   rosuvastatin  20 mg Per Tube Daily   sodium chloride flush  10-40 mL Intracatheter Q12H   Infusions:   fentaNYL infusion INTRAVENOUS Stopped (05/04/23 1100)  heparin 800 Units/hr (05/04/23 1100)   propofol (DIPRIVAN) infusion Stopped (05/04/23 1124)    Assessment: 67yo female was found unresponsive at home, EKG showed STEMI. Pharmacy dosing heparin   -heparin level= 0.11 on 800 units/hr,CBC stable  Goal of Therapy:  Heparin level 0.3-0.7 units/ml Monitor platelets by anticoagulation protocol: Yes   Plan:  -Heparin 1500 units x1 then increase infusion to 1000 units/hr -Heparin level in 6 hours and daily wth CBC daily  Harland German, PharmD Clinical Pharmacist **Pharmacist phone directory can now be found on [email or web address] ([email or web address]).  Listed under [facility].

## 2023-05-04 NOTE — Progress Notes (Signed)
Transported pt from ED to ICU on vent with no incident

## 2023-05-04 NOTE — H&P (Incomplete)
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  She was found unresponsive around 2100 by family after last being seen normal around 1200 at which time she was laying down for a nap.  Shallow respirations noted.  Unresponsive to Narcan.  ECG performed that showed inferoposterior ST elevations.  She was intubated for airway protection in the ED and has been seen by interventional cardiology.  No urgent cardiac catheterization is planned, aspirin, heparin ordered pending head CT results.  Troponin 6562.  Chest x-ray reviewed by me, clear without any infiltrates, ET tube in good position.   Pertinent  Medical History   Past Medical History:  Diagnosis Date  . Arthritis   . Cancer (HCC)    Throat cancer  . Cough    GREEN SPUTUM   . Diabetes mellitus    Type II  . History of kidney stones    passed  . Osteomyelitis of foot, left, acute (HCC) 11/09/2013    Significant Hospital Events: Including procedures, antibiotic start and stop dates in addition to other pertinent events   Head CT 11/28>  Chest x-ray 11/28 > clear  Interim History / Subjective:    Objective   Blood pressure 118/70, pulse 89, temperature 97.9 F (36.6 C), temperature source Axillary, resp. rate 10, height 5\' 7"  (1.702 m), weight 71 kg.    Vent Mode: PRVC FiO2 (%):  [100 %] 100 % Set Rate:  [15 bmp] 15 bmp Vt Set:  [490 mL] 490 mL PEEP:  [5 cmH20] 5 cmH20  No intake or output data in the 24 hours ending May 09, 2023 2322 Filed Weights   May 09, 2023 2302  Weight: 71 kg    Examination: General: *** HENT: *** Lungs: *** Cardiovascular:  *** Abdomen: *** Extremities: *** Neuro: *** GU: ***  Resolved Hospital Problem list   ***  Assessment & Plan:  ***  Best Practice (right click and "Reselect all SmartList Selections" daily)   Diet/type: {diet type:25684} DVT prophylaxis: {anticoagulation (Optional):25687} GI prophylaxis: {QQ:59563} Lines: {Central Venous Access:25771} Foley:  {Central Venous Access:25691} Code Status:  {Code Status:26939} Last date of multidisciplinary goals of care discussion [***]  Labs   CBC: Recent Labs  Lab 09-May-2023 2205  WBC 8.5  NEUTROABS 5.6  HGB 11.2*  HCT 33.6*  MCV 91.8  PLT 252    Basic Metabolic Panel: Recent Labs  Lab 2023/05/09 2205  NA 134*  K 4.4  CL 99  CO2 27  GLUCOSE 205*  BUN 39*  CREATININE 0.91  CALCIUM 8.1*   GFR: Estimated Creatinine Clearance: 56.7 mL/min (by C-G formula based on SCr of 0.91 mg/dL). Recent Labs  Lab 2023/05/09 2205 09-May-2023 2218  WBC 8.5  --   LATICACIDVEN  --  1.5    Liver Function Tests: Recent Labs  Lab 05-09-23 2205  AST 32  ALT 44  ALKPHOS 85  BILITOT 0.7  PROT 6.5  ALBUMIN 2.7*   No results for input(s): "LIPASE", "AMYLASE" in the last 168 hours. No results for input(s): "AMMONIA" in the last 168 hours.  ABG No results found for: "PHART", "PCO2ART", "PO2ART", "HCO3", "TCO2", "ACIDBASEDEF", "O2SAT"   Coagulation Profile: Recent Labs  Lab 09-May-2023 2205  INR 1.2  Cardiac Enzymes: No results for input(s): "CKTOTAL", "CKMB", "CKMBINDEX", "TROPONINI" in the last 168 hours.  HbA1C: Hgb A1c MFr Bld  Date/Time Value Ref Range Status  2023-05-11 10:05 PM 7.2 (H) 4.8 - 5.6 % Final    Comment:    (NOTE) Pre diabetes:          5.7%-6.4%  Diabetes:              >6.4%  Glycemic control for   <7.0% adults with diabetes   04/09/2023 10:20 AM 6.7 (H) 4.8 - 5.6 % Final    Comment:             Prediabetes: 5.7 - 6.4          Diabetes: >6.4          Glycemic control for adults with diabetes: <7.0      CBG: No results for input(s): "GLUCAP" in the last 168 hours.  Review of Systems:   ***  Past Medical History:  She,  has a past medical history of Arthritis, Cancer (HCC), Cough, Diabetes mellitus, History of kidney stones, and Osteomyelitis of foot, left, acute (HCC) (11/09/2013).   Surgical History:   Past Surgical History:  Procedure Laterality Date  . BREAST SURGERY Left    Left breast biopsy, negative  . CESAREAN SECTION    . CHOLECYSTECTOMY    . HIP ARTHROPLASTY  03/16/2011   Procedure: Right ARTHROPLASTY BIPOLAR HIP;  Surgeon: Darreld Mclean;  Location: [facility];  Service: Orthopedics;  Laterality: Right;  . LARYNGOSCOPY AND ESOPHAGOSCOPY N/A 07/29/2018   Procedure: DIRECT LARYNGOSCOPY AND ESOPHAGOSCOPY WITH BIOPSY;  Surgeon: Serena Colonel, MD;  Location: [facility];  Service: ENT;  Laterality: N/A;  . LUMBAR LAMINECTOMY/DECOMPRESSION MICRODISCECTOMY Left 05/16/2015   Procedure: Laminectomy and Foraminotomy - L4-L5 - left, resection of synovial cyst;  Surgeon: Tia Alert, MD;  Location: [facility];  Service: Neurosurgery;  Laterality: Left;  Laminectomy and Foraminotomy - L4-L5 - left, resection of synovial cyst     Social History:   reports that she has been smoking cigarettes. She started smoking about 54 years ago. She has a 39.5 pack-year smoking history. She has never used smokeless tobacco. She reports that she does not drink alcohol and does not use drugs.   Family History:  Her family history includes Diabetes in her mother. There is no history of Colon cancer.   Allergies Allergies  Allergen Reactions  . Morphine And Codeine Nausea And Vomiting  . Tramadol Nausea Only     Home Medications  Prior to Admission medications   Medication Sig Start Date End Date Taking? Authorizing Provider  albuterol (PROVENTIL HFA;VENTOLIN HFA) 108 (90 Base) MCG/ACT inhaler Inhale 2 puffs into the lungs every 6 (six) hours as needed for wheezing or shortness of breath.      [provider]  clonazePAM (KLONOPIN) 1 MG tablet Take 1 mg by mouth at bedtime. 09/02/19   [provider]  cyclobenzaprine (FLEXERIL) 10 MG tablet Take 10 mg by mouth 3 (three) times daily as needed for muscle spasms. 10/16/19   [provider]  HYDROcodone-acetaminophen (NORCO) 7.5-325 MG tablet Take 1 tablet by mouth every 6 (six) hours as needed (pain.). 04/09/23   Gilmore Laroche, FNP  levothyroxine (SYNTHROID) 50 MCG tablet Take 50 mcg by mouth daily before breakfast. 04/22/20   [provider]  metFORMIN (GLUCOPHAGE) 1000 MG tablet Take 1 tablet (1,000 mg total) by mouth daily. 04/16/23   Gilmore Laroche, FNP  terbinafine (LAMISIL)  1 % cream Apply 1 Application topically 2 (two) times daily. 04/09/23   Gilmore Laroche, FNP  Vitamin D, Ergocalciferol, (DRISDOL) 1.25 MG (50000 UNIT) CAPS capsule Take 1 capsule (50,000 Units total) by mouth every 7 (seven) days. 04/16/23   Gilmore Laroche, FNP     Critical care time: ***      Levy Pupa, MD, PhD 05-26-23, 11:22 PM [facility] [phone] or if no answer before 7:00PM call [phone] For any issues after 7:00PM please call eLink [phone]

## 2023-05-04 NOTE — Progress Notes (Addendum)
Rounding Note    Patient Name: Brandy Fisher Date of Encounter: 05/04/2023  [facility] Cardiologist: None   Subjective   Intubated and sedated on vent  Inpatient Medications    Scheduled Meds:  Chlorhexidine Gluconate Cloth  6 each Topical Q0600   docusate  100 mg Per Tube BID   famotidine  20 mg Per Tube BID   fentaNYL (SUBLIMAZE) injection  25 mcg Intravenous Once   insulin aspart  0-15 Units Subcutaneous Q4H   mouth rinse  15 mL Mouth Rinse Q2H   polyethylene glycol  17 g Per Tube Daily   sodium chloride flush  10-40 mL Intracatheter Q12H   Continuous Infusions:  fentaNYL infusion INTRAVENOUS 75 mcg/hr (05/04/23 0800)   heparin 800 Units/hr (05/04/23 0800)   propofol (DIPRIVAN) infusion 10 mcg/kg/min (05/04/23 0800)   PRN Meds: docusate, fentaNYL, midazolam, ondansetron (ZOFRAN) IV, mouth rinse, polyethylene glycol, sodium chloride flush   Vital Signs    Vitals:   05/04/23 0700 05/04/23 0748 05/04/23 0800 05/04/23 0900  BP: (!) 84/56  (!) 93/54 (!) 99/57  Pulse:  100    Resp: 20 (!) 30 (!) 27 (!) 24  Temp:      TempSrc:      SpO2:  100% 100%   Weight:      Height:        Intake/Output Summary (Last 24 hours) at 05/04/2023 1001 Last data filed at 05/04/2023 0800 Gross per 24 hour  Intake 172.11 ml  Output 565 ml  Net -392.89 ml      05/04/2023    5:00 AM 05/04/2023   12:23 AM 05/03/2023   11:02 PM  Last 3 Weights  Weight (lbs) 151 lb 14.4 oz 151 lb 14.4 oz 156 lb 8.4 oz  Weight (kg) 68.9 kg 68.9 kg 71 kg      Telemetry    Sinus at 97 - Personally Reviewed  ECG    ECG (independently read by me): Sinus tachycardia at 103 with inferolateral ST elevation in leads II 3, F, V3 through V5  and ST depression  1 and L  Physical Exam   GEN: Intubated on vent; appears older than stated age Neck: No JVD Cardiac: RRR, no murmurs, rubs, or gallops.  Respiratory: no wheezing GI: Soft, nontender, non-distended  MS: No edema; No  deformity. Foley catheter in place Neuro:  Nonfocal  Psych: Normal affect   Labs    High Sensitivity Troponin:   Recent Labs  Lab 05/03/23 2205 05/04/23 0302  TROPONINIHS 6,562* 7,214*     Chemistry Recent Labs  Lab 05/03/23 2205 05/03/23 2342 05/04/23 0302 05/04/23 0441  NA 134* 133* 134* 135  K 4.4 4.0 3.8 3.8  CL 99  --  99  --   CO2 27  --  25  --   GLUCOSE 205*  --  160*  --   BUN 39*  --  37*  --   CREATININE 0.91  --  0.87  --   CALCIUM 8.1*  --  8.2*  --   MG  --   --  2.4  --   PROT 6.5  --   --   --   ALBUMIN 2.7*  --   --   --   AST 32  --   --   --   ALT 44  --   --   --   ALKPHOS 85  --   --   --   BILITOT 0.7  --   --   --  GFRNONAA >60  --  >60  --   ANIONGAP 8  --  10  --     Lipids  Recent Labs  Lab 05/03/23 2205 05/04/23 0302  CHOL 131  --   TRIG 131 144  HDL 16*  --   LDLCALC 89  --   CHOLHDL 8.2  --     Hematology Recent Labs  Lab 05/03/23 2205 05/03/23 2342 05/04/23 0302 05/04/23 0441  WBC 8.5  --  9.7  --   RBC 3.66*  --  3.64*  --   HGB 11.2* 10.9* 10.8* 10.5*  HCT 33.6* 32.0* 33.2* 31.0*  MCV 91.8  --  91.2  --   MCH 30.6  --  29.7  --   MCHC 33.3  --  32.5  --   RDW 13.8  --  13.8  --   PLT 252  --  234  --    Thyroid No results for input(s): "TSH", "FREET4" in the last 168 hours.  BNPNo results for input(s): "BNP", "PROBNP" in the last 168 hours.  DDimer No results for input(s): "DDIMER" in the last 168 hours.   Radiology    CT Head Wo Contrast  Result Date: 05/04/2023 CLINICAL DATA:  Headache altered mental status EXAM: CT HEAD WITHOUT CONTRAST TECHNIQUE: Contiguous axial images were obtained from the base of the skull through the vertex without intravenous contrast. RADIATION DOSE REDUCTION: This exam was performed according to the departmental dose-optimization program which includes automated exposure control, adjustment of the mA and/or kV according to patient size and/or use of iterative reconstruction  technique. COMPARISON:  MRI report 05/10/2019, 07/30/2020 FINDINGS: Brain: No acute territorial infarction or hemorrhage is visualized. 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 Atrophy and mild chronic small vessel ischemic changes of the white matter. Nonenlarged ventricles Vascular: No hyperdense vessels.  Carotid vascular calcification Skull: Normal. Negative for fracture or focal lesion. Sinuses/Orbits: No acute finding. Other: None IMPRESSION: 1. No CT evidence for acute intracranial abnormality. 2. Atrophy and mild chronic small vessel ischemic changes of the white matter. 3. Stable 2.1 cm densely calcified extra-axial mass in the right middle cranial fossa. Electronically Signed   By: Jasmine Pang M.D.   On: 05/04/2023 01:16   DG Abdomen 1 View  Result Date: 05/04/2023 CLINICAL DATA:  NG tube EXAM: ABDOMEN - 1 VIEW COMPARISON:  10/03/2018 FINDINGS: Esophageal tube tip at the mid stomach.  Nonobstructed gas pattern IMPRESSION: Esophageal tube tip at the mid stomach. Electronically Signed   By: Jasmine Pang M.D.   On: 05/04/2023 01:12   DG Chest Portable 1 View  Result Date: 05/04/2023 CLINICAL DATA:  Intubated EXAM: PORTABLE CHEST 1 VIEW COMPARISON:  05/09/2015, PET CT 07/10/2019 FINDINGS: Right-sided central venous port tip over the SVC. Endotracheal tube tip about 3 cm superior to carina though carina is suboptimally visualized. Esophageal tube tip below the diaphragm but incompletely included. No acute airspace disease or effusion. Normal cardiac size. Aortic atherosclerosis. IMPRESSION: 1. Endotracheal tube tip about 3 cm superior to carina, note that carina somewhat difficult to visualize on this exam. Esophageal tube tip below the diaphragm but incompletely included. 2. No acute airspace disease. Electronically Signed   By: Jasmine Pang M.D.   On: 05/04/2023 01:11    Cardiac Studies   Will obtain 2D  echo Doppler study today  Patient Profile     67 y.o. female with  a significant past medical history of insulin-dependent diabetes, chronic narcotic use,  pharyngeal squamous cell carcinoma (stage IV a) status post radiation with concomitant chemotherapy, who is being seen 05/03/2023 for the evaluation of STEMI-ACS at the request of Dr. Karmen Stabs.  She has chronic narcotic use pharyngeal squamous cell CA radiation chemotherapy,  Assessment & Plan    Acute myocardial infarction: Patient was found unresponsive at home last evening initial ECG raise concern for inferior posterior STEMI.  Troponins elevated at 6562 increasing to 7214.  Currently blood pressure is on the low side but stable at 104/61.  Rhythm is sinus rhythm with heart rate in the mid 90s.  O2 sat is normal at 99% on ventilator.  Will obtain 2D echo Doppler study today for assessment of LV systolic and diastolic function and wall motion abnormality.  Head CT was negative for acute bleed, she now is on heparin infusion.  Will start aspirin and statin.  Will continue to monitor and not plan urgent cath today.  She was not taken urgently to the Cath Lab last night.  Hemodynamics currently stable.  No significant arrhythmia.  Would not take acutely to Cath Lab today since she is past the therapeutic potential window for myocardial salvage with her occlusion yesterday.  However if stabilizes, catheterization can be performed later during admission.  Acute respiratory failure: Now intubated followed by pulmonary critical care.  Acute anoxic encephalopathy: Patient has history of narcotic use and chronic pain. Stage IVa pharyngeal squamous cell carcinoma treated with radiation and chemotherapy in 2020. History of tobacco use Type 2 diabetes mellitus on sliding scale insulin Anemic with hemoglobin 10.5 and hematocrit 31.0  For questions or updates, please contact [facility] Please consult [email or web address] for contact info under     CC: 35  minutes    Signed, Nicki Guadalaj[place], MD  05/04/2023, 10:01 AM

## 2023-05-04 NOTE — Progress Notes (Addendum)
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  She was found unresponsive around 2100 by family after last being seen normal around 1200 at which time she was laying down for a nap.  Shallow respirations noted.  Unresponsive to Narcan.  ECG performed that showed inferoposterior ST elevations.  She was intubated for airway protection in the ED and has been seen by interventional cardiology.  No urgent cardiac catheterization is planned, aspirin, heparin ordered pending head CT results.  Troponin 6562.  Chest x-ray reviewed by me, clear without any infiltrates, ET tube in good position.   Pertinent  Medical History   Past Medical History:  Diagnosis Date   Arthritis    Cancer (HCC)    Throat cancer   Cough    GREEN SPUTUM    Diabetes mellitus    Type II   History of kidney stones    passed   Osteomyelitis of foot, left, acute (HCC) 11/09/2013    Significant Hospital Events: Including procedures, antibiotic start and stop dates in addition to other pertinent events   2023/05/21 admitted w/ ams and intubated for airway protection  Interim History / Subjective:  Patient sedate on 10 prop and 75 fentanyl; moving extremities but not following commands; cough/gag present On vent PSV 8/5 doing well  Objective   Blood pressure (!) 99/57, pulse 100, temperature 98.5 F (36.9 C), temperature source Oral, resp. rate (!) 24, height 5\' 7"  (1.702 m), weight 68.9 kg, SpO2 100%.    Vent Mode: CPAP;PSV FiO2 (%):  [40 %-100 %] 40 % Set Rate:  [15 bmp] 15 bmp Vt Set:  [490 mL] 490 mL PEEP:  [5 cmH20] 5 cmH20 Pressure Support:  [8  cmH20] 8 cmH20 Plateau Pressure:  [16 cmH20] 16 cmH20   Intake/Output Summary (Last 24 hours) at 05/04/2023 1008 Last data filed at 05/04/2023 0800 Gross per 24 hour  Intake 172.11 ml  Output 565 ml  Net -392.89 ml   Filed Weights   May 21, 2023 2302 05/04/23 0023 05/04/23 0500  Weight: 71 kg 68.9 kg 68.9 kg    Examination: General: critically ill appearing on mech vent HEENT: MM pink/moist; ETT in place Neuro: sedate on 10 prop and 75 fentanyl; moving extremities but not following commands; cough/gag present; perrl CV: s1s2, RRR, no m/r/g PULM:  dim clear BS bilaterally; on mech vent PRVC GI: soft, bsx4 active  Extremities: cool/dry, no edema; rue port in place  CT head 05/21/23: no acute abnormality; stable 2.1 cm calcified extra-axial mass in right middle cranial fossa  CXR 05/21/23: no significant findings  Resolved Hospital Problem list     Assessment & Plan:   Acute encephalopathy, unclear etiology.  Given history of narcotics/benzo use and chronic pain consider toxic ingestion; she did not respond to Narcan.  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 -uds negative Plan: -check ammonia and tsh -limit sedating meds -wean sedation for RASS 0 to -1 -consider further workup with EEG and/or MRI if no improvement in mental status with above  Acute respiratory failure due  to impaired airway protection due to the above Plan: -doing well on SBT this am; will hold on extubation given poor mental status -LTVV strategy with tidal volumes of 6-8 cc/kg ideal body weight -Wean PEEP/FiO2 for SpO2 >92% -VAP bundle in place -Daily SAT and SBT -PAD protocol in place -wean sedation for RASS goal 0 to -1  ACS with apparent STEMI Plan: -cards following; appreciate recs -tele monitoring -heart cath when stable -trend trop -cont  heparin -cont asa and statin -echo pending  Chronic pain with narcotics use Plan: -hold home meds w/ encephalopathy  DMT2 -A1c 7.2 Plan: -ssi and cbg monitoring  Hx of anxiety? Plan: -hold home klonopin  Hx of subclinical hypothyroidism Plan: -cont home synthroid -check tsh  Anemia Plan: -trend cbc  History of pharyngeal cancer on observation Plan: -supportive care -f/u outpt w/ onc  At risk malnutrition Plan: -TF per RD  Best Practice (right click and "Reselect all SmartList Selections" daily)   Diet/type: tubefeeds DVT prophylaxis: systemic heparin GI prophylaxis: H2B Lines: N/A Foley:  Yes, and it is still needed Code Status:  full code Last date of multidisciplinary goals of care discussion [10/29 updated son over phone]  Labs   CBC: Recent Labs  Lab 04/10/2023 2205 04/29/2023 2342 05/04/23 0302 05/04/23 0441  WBC 8.5  --  9.7  --   NEUTROABS 5.6  --   --   --   HGB 11.2* 10.9* 10.8* 10.5*  HCT 33.6* 32.0* 33.2* 31.0*  MCV 91.8  --  91.2  --   PLT 252  --  234  --     Basic Metabolic Panel: Recent Labs  Lab 04/30/2023 2205 05/04/2023 2342 05/04/23 0302 05/04/23 0441  NA 134* 133* 134* 135  K 4.4 4.0 3.8 3.8  CL 99  --  99  --   CO2 27  --  25  --   GLUCOSE 205*  --  160*  --   BUN 39*  --  37*  --   CREATININE 0.91  --  0.87  --   CALCIUM 8.1*  --  8.2*  --   MG  --   --  2.4  --   PHOS  --   --  3.2  --    GFR: Estimated Creatinine Clearance: 59.3 mL/min (by C-G formula based on SCr of 0.87 mg/dL). Recent Labs  Lab 04/07/2023 2205 04/27/2023 2218 05/04/23 0302  WBC 8.5  --  9.7  LATICACIDVEN  --  1.5  --     Liver Function Tests: Recent Labs  Lab 04/18/2023 2205  AST 32  ALT 44  ALKPHOS 85  BILITOT 0.7  PROT 6.5  ALBUMIN 2.7*   No results for input(s): "LIPASE", "AMYLASE" in the last 168 hours. No results for input(s): "AMMONIA" in the last 168 hours.  ABG    Component Value Date/Time   PHART 7.359 05/04/2023 0441    PCO2ART 44.8 05/04/2023 0441   PO2ART 90 05/04/2023 0441   HCO3 25.2 05/04/2023 0441   TCO2 27 05/04/2023 0441   ACIDBASEDEF 1.0 04/09/2023 2342   O2SAT 97 05/04/2023 0441     Coagulation Profile: Recent Labs  Lab 04/25/2023 2205  INR 1.2    Cardiac Enzymes: No results for input(s): "CKTOTAL", "CKMB", "CKMBINDEX", "TROPONINI" in the last 168 hours.  HbA1C: Hgb A1c MFr Bld  Date/Time Value Ref Range Status  05/05/2023 10:05 PM 7.2 (H) 4.8 - 5.6 % Final    Comment:    (NOTE) Pre  diabetes:          5.7%-6.4%  Diabetes:              >6.4%  Glycemic control for   <7.0% adults with diabetes   04/09/2023 10:20 AM 6.7 (H) 4.8 - 5.6 % Final    Comment:             Prediabetes: 5.7 - 6.4          Diabetes: >6.4          Glycemic control for adults with diabetes: <7.0     CBG: Recent Labs  Lab 05/04/23 0037 05/04/23 0423 05/04/23 0852  GLUCAP 208* 122* 109*    Review of Systems:   60  Past Medical History:  She,  has a past medical history of Arthritis, Cancer (HCC), Cough, Diabetes mellitus, History of kidney stones, and Osteomyelitis of foot, left, acute (HCC) (11/09/2013).   Surgical History:   Past Surgical History:  Procedure Laterality Date   BREAST SURGERY Left    Left breast biopsy, negative   CESAREAN SECTION     CHOLECYSTECTOMY     HIP ARTHROPLASTY  03/16/2011   Procedure: Right ARTHROPLASTY BIPOLAR HIP;  Surgeon: Darreld Mclean;  Location: [facility];  Service: Orthopedics;  Laterality: Right;   LARYNGOSCOPY AND ESOPHAGOSCOPY N/A 07/29/2018   Procedure: DIRECT LARYNGOSCOPY AND ESOPHAGOSCOPY WITH BIOPSY;  Surgeon: Serena Colonel, MD;  Location: [facility];  Service: ENT;  Laterality: N/A;   LUMBAR LAMINECTOMY/DECOMPRESSION MICRODISCECTOMY Left 05/16/2015   Procedure: Laminectomy and Foraminotomy - L4-L5 - left, resection of synovial cyst;  Surgeon: Tia Alert, MD;  Location: [facility];  Service: Neurosurgery;  Laterality: Left;  Laminectomy and Foraminotomy -  L4-L5 - left, resection of synovial cyst     Social History:   reports that she has been smoking cigarettes. She started smoking about 54 years ago. She has a 39.5 pack-year smoking history. She has never used smokeless tobacco. She reports that she does not drink alcohol and does not use drugs.   Family History:  Her family history includes Diabetes in her mother. There is no history of Colon cancer.   Allergies Allergies  Allergen Reactions   Morphine And Codeine Nausea And Vomiting   Tramadol Nausea Only     Home Medications  Prior to Admission medications   Medication Sig Start Date End Date Taking? Authorizing Provider  albuterol (PROVENTIL HFA;VENTOLIN HFA) 108 (90 Base) MCG/ACT inhaler Inhale 2 puffs into the lungs every 6 (six) hours as needed for wheezing or shortness of breath.     [provider]  clonazePAM (KLONOPIN) 1 MG tablet Take 1 mg by mouth at bedtime. 09/02/19   [provider]  cyclobenzaprine (FLEXERIL) 10 MG tablet Take 10 mg by mouth 3 (three) times daily as needed for muscle spasms. 10/16/19   [provider]  HYDROcodone-acetaminophen (NORCO) 7.5-325 MG tablet Take 1 tablet by mouth every 6 (six) hours as needed (pain.). 04/09/23   Gilmore Laroche, FNP  levothyroxine (SYNTHROID) 50 MCG tablet Take 50 mcg by mouth daily before breakfast. 04/22/20   [provider]  metFORMIN (GLUCOPHAGE) 1000 MG tablet Take 1 tablet (1,000 mg total) by mouth daily. 04/16/23   Gilmore Laroche, FNP  terbinafine (LAMISIL) 1 % cream Apply 1 Application topically 2 (two) times daily. 04/09/23   Gilmore Laroche, FNP  Vitamin D, Ergocalciferol, (DRISDOL) 1.25 MG (50000 UNIT) CAPS capsule Take 1 capsule (50,000 Units total) by mouth every 7 (seven) days. 04/16/23  Gilmore Laroche, FNP     Critical care time: 40 min    JD Anselm Lis [facility] 05/04/2023, 10:42 AM  Please see [email or web address] for pager details.  From 7A-7P if no  response, please call [phone]. After hours, please call ELink [phone].

## 2023-05-04 NOTE — Progress Notes (Signed)
Declotting implanted port: No blood return after 2 hours tPA dwell time. Port de-accessed and re-accessed. Brisk blood return noted after flushing. Second dose tPA no longer needed.

## 2023-05-04 NOTE — Progress Notes (Signed)
PHARMACY - ANTICOAGULATION CONSULT NOTE  Pharmacy Consult for heparin Indication:  ACS/STEMI  Allergies  Allergen Reactions   Morphine And Codeine Nausea And Vomiting   Tramadol Nausea Only    Patient Measurements: Height: 5\' 7"  (170.2 cm) Weight: 68.9 kg (151 lb 14.4 oz) IBW/kg (Calculated) : 61.6  Vital Signs: Temp: 98.8 F (37.1 C) (10/29 1600) Temp Source: Oral (10/29 1600) BP: 105/62 (10/29 1900) Pulse Rate: 92 (10/29 1830)  Labs: Recent Labs    05/03/23 2205 05/03/23 2342 05/04/23 0302 05/04/23 0441 05/04/23 1100 05/04/23 1805  HGB 11.2* 10.9* 10.8* 10.5*  --   --   HCT 33.6* 32.0* 33.2* 31.0*  --   --   PLT 252  --  234  --   --   --   APTT 22*  --   --   --   --   --   LABPROT 15.7*  --   --   --   --   --   INR 1.2  --   --   --   --   --   HEPARINUNFRC  --   --   --   --  0.11* 0.13*  CREATININE 0.91  --  0.87  --   --   --   ONGEXBMWUXL 2,440*  --  7,214*  --   --   --     Estimated Creatinine Clearance: 59.3 mL/min (by C-G formula based on SCr of 0.87 mg/dL).   Medical History: Past Medical History:  Diagnosis Date   Arthritis    Cancer (HCC)    Throat cancer   Cough    GREEN SPUTUM    Diabetes mellitus    Type II   History of kidney stones    passed   Osteomyelitis of foot, left, acute (HCC) 11/09/2013    Medications:  Medications Prior to Admission  Medication Sig Dispense Refill Last Dose   albuterol (PROVENTIL HFA;VENTOLIN HFA) 108 (90 Base) MCG/ACT inhaler Inhale 2 puffs into the lungs every 6 (six) hours as needed for wheezing or shortness of breath.       clonazePAM (KLONOPIN) 1 MG tablet Take 1 mg by mouth at bedtime.      cyclobenzaprine (FLEXERIL) 10 MG tablet Take 10 mg by mouth 3 (three) times daily as needed for muscle spasms.      HYDROcodone-acetaminophen (NORCO) 7.5-325 MG tablet Take 1 tablet by mouth every 6 (six) hours as needed (pain.). 20 tablet 0    levothyroxine (SYNTHROID) 50 MCG tablet Take 50 mcg by mouth daily  before breakfast.      metFORMIN (GLUCOPHAGE) 1000 MG tablet Take 1 tablet (1,000 mg total) by mouth daily. 90 tablet 1    terbinafine (LAMISIL) 1 % cream Apply 1 Application topically 2 (two) times daily. 30 g 0    Vitamin D, Ergocalciferol, (DRISDOL) 1.25 MG (50000 UNIT) CAPS capsule Take 1 capsule (50,000 Units total) by mouth every 7 (seven) days. 20 capsule 1    Scheduled:   aspirin  81 mg Per Tube Daily   Chlorhexidine Gluconate Cloth  6 each Topical Q0600   docusate  100 mg Per Tube BID   famotidine  20 mg Per Tube BID   insulin aspart  0-15 Units Subcutaneous Q4H   [START ON 05/05/2023] levothyroxine  50 mcg Per Tube QAC breakfast   mouth rinse  15 mL Mouth Rinse Q2H   polyethylene glycol  17 g Per Tube Daily   rosuvastatin  20  mg Per Tube Daily   sodium chloride flush  10-40 mL Intracatheter Q12H   Infusions:   feeding supplement (VITAL AF 1.2 CAL) 20 mL/hr at 05/04/23 1600   fentaNYL infusion INTRAVENOUS Stopped (05/04/23 1100)   heparin 1,000 Units/hr (05/04/23 1600)   propofol (DIPRIVAN) infusion Stopped (05/04/23 1124)    Assessment: 67yo female was found unresponsive at home, EKG showed STEMI. Pharmacy dosing heparin   -heparin level= 0.13, remains sub-therapeutic after a bolus of 1500 units and increase to 1000 units/hr. CBC stable. No bleeding or issues per RN that just came on shift.   Goal of Therapy:  Heparin level 0.3-0.7 units/ml Monitor platelets by anticoagulation protocol: Yes   Plan:  -Heparin 2000 units x1 then increase infusion to 1200 units/hr -Heparin level in 6 hours and daily wth CBC daily  Link Snuffer, PharmD, BCPS, BCCCP **Pharmacist phone directory can now be found on [email or web address] ([email or web address]).  Listed under [facility].

## 2023-05-04 NOTE — Progress Notes (Signed)
eLink Physician-Brief Progress Note Patient Name: Brandy Fisher DOB: 02/11/1956 MRN: [id number]   Date of Service  05/04/2023  HPI/Events of Note  Patient admitted with altered mental status and acute respiratory failure requiring intubation, in the context of STEMI changes on EKG, patient intubated and on the ventilator.  eICU Interventions  New patient Evaluation.        [name] [name] 05/04/2023, 12:29 AM

## 2023-05-04 NOTE — Progress Notes (Signed)
Sputum obtained and sent to lab

## 2023-05-05 ENCOUNTER — Other Ambulatory Visit (HOSPITAL_COMMUNITY): Payer: Self-pay

## 2023-05-05 DIAGNOSIS — I255 Ischemic cardiomyopathy: Secondary | ICD-10-CM

## 2023-05-05 DIAGNOSIS — J9601 Acute respiratory failure with hypoxia: Secondary | ICD-10-CM | POA: Diagnosis not present

## 2023-05-05 DIAGNOSIS — I2111 ST elevation (STEMI) myocardial infarction involving right coronary artery: Secondary | ICD-10-CM | POA: Diagnosis not present

## 2023-05-05 DIAGNOSIS — I2119 ST elevation (STEMI) myocardial infarction involving other coronary artery of inferior wall: Secondary | ICD-10-CM | POA: Diagnosis not present

## 2023-05-05 DIAGNOSIS — G9340 Encephalopathy, unspecified: Secondary | ICD-10-CM | POA: Diagnosis not present

## 2023-05-05 DIAGNOSIS — E440 Moderate protein-calorie malnutrition: Secondary | ICD-10-CM | POA: Insufficient documentation

## 2023-05-05 DIAGNOSIS — D72829 Elevated white blood cell count, unspecified: Secondary | ICD-10-CM

## 2023-05-05 LAB — URINALYSIS, W/ REFLEX TO CULTURE (INFECTION SUSPECTED)
Bacteria, UA: NONE SEEN
Bilirubin Urine: NEGATIVE
Glucose, UA: NEGATIVE mg/dL
Ketones, ur: NEGATIVE mg/dL
Leukocytes,Ua: NEGATIVE
Nitrite: NEGATIVE
Protein, ur: NEGATIVE mg/dL
Specific Gravity, Urine: 1.024 (ref 1.005–1.030)
pH: 5 (ref 5.0–8.0)

## 2023-05-05 LAB — MAGNESIUM
Magnesium: 2.2 mg/dL (ref 1.7–2.4)
Magnesium: 1.9 mg/dL (ref 1.7–2.4)

## 2023-05-05 LAB — BASIC METABOLIC PANEL
Anion gap: 10 (ref 5–15)
BUN: 41 mg/dL — ABNORMAL HIGH (ref 8–23)
CO2: 25 mmol/L (ref 22–32)
Calcium: 7.9 mg/dL — ABNORMAL LOW (ref 8.9–10.3)
Chloride: 100 mmol/L (ref 98–111)
Creatinine, Ser: 0.86 mg/dL (ref 0.44–1.00)
GFR, Estimated: >60 mL/min (ref >60)
Glucose, Bld: 174 mg/dL — ABNORMAL HIGH (ref 70–99)
Potassium: 3.5 mmol/L (ref 3.5–5.1)
Sodium: 135 mmol/L (ref 135–145)

## 2023-05-05 LAB — GLUCOSE, CAPILLARY
Glucose-Capillary: 192 mg/dL — ABNORMAL HIGH (ref 70–99)
Glucose-Capillary: 169 mg/dL — ABNORMAL HIGH (ref 70–99)
Glucose-Capillary: 202 mg/dL — ABNORMAL HIGH (ref 70–99)
Glucose-Capillary: 236 mg/dL — ABNORMAL HIGH (ref 70–99)
Glucose-Capillary: 203 mg/dL — ABNORMAL HIGH (ref 70–99)
Glucose-Capillary: 210 mg/dL — ABNORMAL HIGH (ref 70–99)

## 2023-05-05 LAB — CBC
HCT: 31.1 % — ABNORMAL LOW (ref 36.0–46.0)
Hemoglobin: 10.1 g/dL — ABNORMAL LOW (ref 12.0–15.0)
MCH: 29.8 pg (ref 26.0–34.0)
MCHC: 32.5 g/dL (ref 30.0–36.0)
MCV: 91.7 fL (ref 80.0–100.0)
Platelets: 248 10*3/uL (ref 150–400)
RBC: 3.39 MIL/uL — ABNORMAL LOW (ref 3.87–5.11)
RDW: 14.1 % (ref 11.5–15.5)
WBC: 20 10*3/uL — ABNORMAL HIGH (ref 4.0–10.5)
nRBC: 0.3 % — ABNORMAL HIGH (ref 0.0–0.2)

## 2023-05-05 LAB — HEPARIN LEVEL (UNFRACTIONATED)
Heparin Unfractionated: 0.3 [IU]/mL (ref 0.30–0.70)
Heparin Unfractionated: 0.21 [IU]/mL — ABNORMAL LOW (ref 0.30–0.70)
Heparin Unfractionated: 0.29 [IU]/mL — ABNORMAL LOW (ref 0.30–0.70)

## 2023-05-05 LAB — PHOSPHORUS
Phosphorus: 2.6 mg/dL (ref 2.5–4.6)
Phosphorus: 2.6 mg/dL (ref 2.5–4.6)

## 2023-05-05 MED ORDER — SODIUM CHLORIDE 0.9 % IV SOLN
2.0000 g | INTRAVENOUS | Status: DC
  Administered 2023-05-05 – 2023-05-06 (×2): 2 g via INTRAVENOUS
  Filled 2023-05-05 (×2): qty 20

## 2023-05-05 MED ORDER — FUROSEMIDE 10 MG/ML IJ SOLN
40.0000 mg | Freq: Once | INTRAMUSCULAR | Status: AC
  Administered 2023-05-05: 40 mg via INTRAVENOUS
  Filled 2023-05-05: qty 4

## 2023-05-05 NOTE — Progress Notes (Signed)
Rounding Note    Patient Name: Brandy Fisher Date of Encounter: 05/05/2023  [facility] Cardiologist: None   Subjective   Intubated and sedated on vent  Inpatient Medications    Scheduled Meds:  aspirin  81 mg Per Tube Daily   Chlorhexidine Gluconate Cloth  6 each Topical Q0600   docusate  100 mg Per Tube BID   famotidine  20 mg Per Tube BID   insulin aspart  0-15 Units Subcutaneous Q4H   levothyroxine  50 mcg Per Tube QAC breakfast   mouth rinse  15 mL Mouth Rinse Q2H   polyethylene glycol  17 g Per Tube Daily   rosuvastatin  20 mg Per Tube Daily   sodium chloride flush  10-40 mL Intracatheter Q12H   Continuous Infusions:  feeding supplement (VITAL AF 1.2 CAL) 40 mL/hr at 05/05/23 0801   fentaNYL infusion INTRAVENOUS Stopped (05/04/23 1100)   heparin 1,300 Units/hr (05/05/23 0800)   propofol (DIPRIVAN) infusion Stopped (05/04/23 1124)   PRN Meds: docusate, fentaNYL, ipratropium-albuterol, midazolam, ondansetron (ZOFRAN) IV, mouth rinse, polyethylene glycol, sodium chloride flush   Vital Signs    Vitals:   05/05/23 0700 05/05/23 0730 05/05/23 0759 05/05/23 0800  BP: 100/63     Pulse: 89  96 91  Resp: 20  (!) 22 17  Temp:  97.9 F (36.6 C)    TempSrc:  Axillary    SpO2: 100%  100% 100%  Weight:      Height:        Intake/Output Summary (Last 24 hours) at 05/05/2023 0925 Last data filed at 05/05/2023 0800 Gross per 24 hour  Intake 774.38 ml  Output 290 ml  Net 484.38 ml      05/05/2023    5:00 AM 05/04/2023    5:00 AM 05/04/2023   12:23 AM  Last 3 Weights  Weight (lbs) 150 lb 9.2 oz 151 lb 14.4 oz 151 lb 14.4 oz  Weight (kg) 68.3 kg 68.9 kg 68.9 kg      Telemetry    Sinus at 89 - Personally Reviewed  ECG    05/05/2023 ECG (independently read by me): Normal sinus rhythm at 87 bpm, inferior ST elevation persists, T wave inversion 1, aVL, V2 through V6.  May 07, 2023 ECG (independently read by me): Sinus tachycardia at 103 with  inferolateral ST elevation in leads II 3, F, V3 through V5  and ST depression  1 and L  Physical Exam   GEN: Intubated on vent; appears older than stated age Mouth open Neck: No JVD Cardiac: RRR, no murmurs, rubs, or gallops.  Respiratory: no wheezing GI: Soft, nontender, non-distended  MS: No edema; No deformity. Foley catheter in place Neuro:  Nonfocal  Psych: Normal affect   Labs    High Sensitivity Troponin:   Recent Labs  Lab May 07, 2023 2205 05/04/23 0302  TROPONINIHS 6,562* 7,214*     Chemistry Recent Labs  Lab 05/19/2023 2205 07-May-2023 2342 05/04/23 0302 05/04/23 0441 05/04/23 1545 05/05/23 0203  NA 134* 133* 134* 135  --   --   K 4.4 4.0 3.8 3.8  --   --   CL 99  --  99  --   --   --   CO2 27  --  25  --   --   --   GLUCOSE 205*  --  160*  --   --   --   BUN 39*  --  37*  --   --   --  CREATININE 0.91  --  0.87  --   --   --   CALCIUM 8.1*  --  8.2*  --   --   --   MG  --   --  2.4  --  2.2 2.2  PROT 6.5  --   --   --   --   --   ALBUMIN 2.7*  --   --   --   --   --   AST 32  --   --   --   --   --   ALT 44  --   --   --   --   --   ALKPHOS 85  --   --   --   --   --   BILITOT 0.7  --   --   --   --   --   GFRNONAA >60  --  >60  --   --   --   ANIONGAP 8  --  10  --   --   --     Lipids  Recent Labs  Lab 2023/05/14 2205 05/04/23 0302  CHOL 131  --   TRIG 131 144  HDL 16*  --   LDLCALC 89  --   CHOLHDL 8.2  --     Hematology Recent Labs  Lab 05-14-2023 2205 2023-05-14 2342 05/04/23 0302 05/04/23 0441 05/05/23 0204  WBC 8.5  --  9.7  --  20.0*  RBC 3.66*  --  3.64*  --  3.39*  HGB 11.2*   < > 10.8* 10.5* 10.1*  HCT 33.6*   < > 33.2* 31.0* 31.1*  MCV 91.8  --  91.2  --  91.7  MCH 30.6  --  29.7  --  29.8  MCHC 33.3  --  32.5  --  32.5  RDW 13.8  --  13.8  --  14.1  PLT 252  --  234  --  248   < > = values in this interval not displayed.   Thyroid  Recent Labs  Lab 05/04/23 1017  TSH 3.847    BNPNo results for input(s): "BNP", "PROBNP"  in the last 168 hours.  DDimer No results for input(s): "DDIMER" in the last 168 hours.   Radiology    ECHOCARDIOGRAM COMPLETE  Result Date: 05/04/2023    ECHOCARDIOGRAM REPORT   Patient Name:   Brandy Fisher [facility] Date of Exam: 05/04/2023 Medical Rec #:  [id number]        Height:       67.0 in Accession #:    [id number]       Weight:       151.9 lb Date of Birth:  May 30, 1956        BSA:          1.799 m Patient Age:    67 years         BP:           84/56 mmHg Patient Gender: F                HR:           94 bpm. Exam Location:  Inpatient Procedure: 2D Echo, Cardiac Doppler, Color Doppler and Intracardiac            Opacification Agent Indications:    Acute ischemic heart disease, unspecified I24.9  History:        Patient has no prior history of Echocardiogram examinations.  Previous Myocardial Infarction; Risk Factors:Diabetes and                 Current Smoker.  Sonographer:    Lucendia Herrlich RCS Referring Phys: [id number] ROBERT S BYRUM IMPRESSIONS  1. Left ventricular ejection fraction, by estimation, is <20%. The left ventricle has severely decreased function. The left ventricle demonstrates global hypokinesis. Indeterminate diastolic filling due to E-A fusion. There is akinesis of the left ventricular, entire inferior wall. There is severe hypokinesis of the left ventricular, entire inferolateral wall and lateral wall.  2. Right ventricular systolic function is normal. The right ventricular size is normal. There is normal pulmonary artery systolic pressure. The estimated right ventricular systolic pressure is 33.5 mmHg.  3. The mitral valve is normal in structure. Trivial mitral valve regurgitation. No evidence of mitral stenosis.  4. The aortic valve is tricuspid. Aortic valve regurgitation is not visualized. Aortic valve sclerosis/calcification is present, without any evidence of aortic stenosis. Aortic valve Vmax measures 1.01 m/s.  5. The inferior vena cava is dilated in size with <50%  respiratory variability, suggesting right atrial pressure of 15 mmHg. FINDINGS  Left Ventricle: Left ventricular ejection fraction, by estimation, is <20%. The left ventricle has severely decreased function. The left ventricle demonstrates global hypokinesis. Severe hypokinesis of the left ventricular, entire inferolateral wall and  lateral wall. Definity contrast agent was given IV to delineate the left ventricular endocardial borders. The left ventricular internal cavity size was normal in size. There is no left ventricular hypertrophy. Abnormal (paradoxical) septal motion, consistent with left bundle branch block. Indeterminate diastolic filling due to E-A fusion. Normal left ventricular filling pressure. Right Ventricle: The right ventricular size is normal. No increase in right ventricular wall thickness. Right ventricular systolic function is normal. There is normal pulmonary artery systolic pressure. The tricuspid regurgitant velocity is 2.15 m/s, and  with an assumed right atrial pressure of 15 mmHg, the estimated right ventricular systolic pressure is 33.5 mmHg. Left Atrium: Left atrial size was normal in size. Right Atrium: Right atrial size was normal in size. Pericardium: There is no evidence of pericardial effusion. Mitral Valve: The mitral valve is normal in structure. Trivial mitral valve regurgitation. No evidence of mitral valve stenosis. Tricuspid Valve: The tricuspid valve is normal in structure. Tricuspid valve regurgitation is mild . No evidence of tricuspid stenosis. Aortic Valve: The aortic valve is tricuspid. Aortic valve regurgitation is not visualized. Aortic valve sclerosis/calcification is present, without any evidence of aortic stenosis. Aortic valve peak gradient measures 4.1 mmHg. Pulmonic Valve: The pulmonic valve was normal in structure. Pulmonic valve regurgitation is not visualized. No evidence of pulmonic stenosis. Aorta: The aortic root is normal in size and structure. Venous:  The inferior vena cava is dilated in size with less than 50% respiratory variability, suggesting right atrial pressure of 15 mmHg. IAS/Shunts: No atrial level shunt detected by color flow Doppler.  LEFT VENTRICLE PLAX 2D LVIDd:         4.90 cm   Diastology LVIDs:         4.80 cm   LV e' medial:    4.24 cm/s LV PW:         0.85 cm   LV E/e' medial:  8.2 LV IVS:        0.80 cm   LV e' lateral:   5.55 cm/s LVOT diam:     1.90 cm   LV E/e' lateral: 6.2 LV SV:         23  LV SV Index:   13 LVOT Area:     2.84 cm  RIGHT VENTRICLE            IVC RV S prime:     5.34 cm/s  IVC diam: 2.50 cm TAPSE (M-mode): 0.5 cm LEFT ATRIUM           Index        RIGHT ATRIUM          Index LA diam:      3.20 cm 1.78 cm/m   RA Area:     8.74 cm LA Vol (A2C): 14.5 ml 8.06 ml/m   RA Volume:   18.20 ml 10.12 ml/m LA Vol (A4C): 22.5 ml 12.51 ml/m  AORTIC VALVE AV Area (Vmax): 1.66 cm AV Vmax:        101.00 cm/s AV Peak Grad:   4.1 mmHg LVOT Vmax:      58.97 cm/s LVOT Vmean:     37.633 cm/s LVOT VTI:       0.082 m  AORTA Ao Root diam: 2.70 cm Ao Asc diam:  2.70 cm MITRAL VALVE                TRICUSPID VALVE MV Area (PHT): 4.86 cm     TR Peak grad:   18.5 mmHg MV Decel Time: 156 msec     TR Vmax:        215.00 cm/s MR Peak grad: 49.8 mmHg MR Vmax:      353.00 cm/s   SHUNTS MV E velocity: 34.60 cm/s   Systemic VTI:  0.08 m MV A velocity: 107.00 cm/s  Systemic Diam: 1.90 cm MV E/A ratio:  0.32 Armanda Magic MD Electronically signed by Armanda Magic MD Signature Date/Time: 05/04/2023/12:12:01 PM    Final    CT Head Wo Contrast  Result Date: 05/04/2023 CLINICAL DATA:  Headache altered mental status EXAM: CT HEAD WITHOUT CONTRAST TECHNIQUE: Contiguous axial images were obtained from the base of the skull through the vertex without intravenous contrast. RADIATION DOSE REDUCTION: This exam was performed according to the departmental dose-optimization program which includes automated exposure control, adjustment of the mA and/or kV according  to patient size and/or use of iterative reconstruction technique. COMPARISON:  MRI report 05/10/2019, 07/30/2020 FINDINGS: Brain: No acute territorial infarction or hemorrhage is visualized. 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 Atrophy and mild chronic small vessel ischemic changes of the white matter. Nonenlarged ventricles Vascular: No hyperdense vessels.  Carotid vascular calcification Skull: Normal. Negative for fracture or focal lesion. Sinuses/Orbits: No acute finding. Other: None IMPRESSION: 1. No CT evidence for acute intracranial abnormality. 2. Atrophy and mild chronic small vessel ischemic changes of the white matter. 3. Stable 2.1 cm densely calcified extra-axial mass in the right middle cranial fossa. Electronically Signed   By: Jasmine Pang M.D.   On: 05/04/2023 01:16   DG Abdomen 1 View  Result Date: 05/04/2023 CLINICAL DATA:  NG tube EXAM: ABDOMEN - 1 VIEW COMPARISON:  10/03/2018 FINDINGS: Esophageal tube tip at the mid stomach.  Nonobstructed gas pattern IMPRESSION: Esophageal tube tip at the mid stomach. Electronically Signed   By: Jasmine Pang M.D.   On: 05/04/2023 01:12   DG Chest Portable 1 View  Result Date: 05/04/2023 CLINICAL DATA:  Intubated EXAM: PORTABLE CHEST 1 VIEW COMPARISON:  05/09/2015, PET CT 07/10/2019 FINDINGS: Right-sided central venous port tip over the SVC. Endotracheal tube  tip about 3 cm superior to carina though carina is suboptimally visualized. Esophageal tube tip below the diaphragm but incompletely included. No acute airspace disease or effusion. Normal cardiac size. Aortic atherosclerosis. IMPRESSION: 1. Endotracheal tube tip about 3 cm superior to carina, note that carina somewhat difficult to visualize on this exam. Esophageal tube tip below the diaphragm but incompletely included. 2. No acute airspace disease. Electronically Signed   By: Jasmine Pang M.D.   On:  05/04/2023 01:11    Cardiac Studies   ECHO: 05/04/2023  1. Left ventricular ejection fraction, by estimation, is <20%. The left  ventricle has severely decreased function. The left ventricle demonstrates  global hypokinesis. Indeterminate diastolic filling due to E-A fusion.  There is akinesis of the left  ventricular, entire inferior wall. There is severe hypokinesis of the left  ventricular, entire inferolateral wall and lateral wall.   2. Right ventricular systolic function is normal. The right ventricular  size is normal. There is normal pulmonary artery systolic pressure. The  estimated right ventricular systolic pressure is 33.5 mmHg.   3. The mitral valve is normal in structure. Trivial mitral valve  regurgitation. No evidence of mitral stenosis.   4. The aortic valve is tricuspid. Aortic valve regurgitation is not  visualized. Aortic valve sclerosis/calcification is present, without any  evidence of aortic stenosis. Aortic valve Vmax measures 1.01 m/s.   5. The inferior vena cava is dilated in size with <50% respiratory  variability, suggesting right atrial pressure of 15 mmHg.    Patient Profile     67 y.o. 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 Karmen Stabs.  She has chronic narcotic use pharyngeal squamous cell CA radiation chemotherapy,  Assessment & Plan    Acute myocardial infarction: Patient was found unresponsive at home on 2023/05/08; initial ECG raise concern for inferior posterior STEMI.  Troponins elevated at 6562 increasing to 7214.  Blood pressure is on the low side but stable at 100/65.  Rhythm is sinus rhythm with heart rate in the mid 90s.  O2 sat is normal at 99% on ventilator.   Head CT was negative for acute bleed, she now  is on heparin infusion.  ASA, and rosuvastatin was started on 10/29.  Will continue to monitor and not plan urgent cath.  She was not taken urgently to the Cath Lab last night.  Hemodynamics currently stable.  No significant arrhythmia.  Would not take acutely to Cath Lab today since she is past the therapeutic potential window for myocardial salvage with her occlusion yesterday.  However if stabilizes, catheterization can be performed later during admission.  Severe ischemic cardiomyopathy.  Echo Doppler shows EF probably 15%.  There is extensive akinesis of the entire inferior and lateral wall.  Estimated RV systolic pressure 33 mm.  No obvious mural thrombus.  Patient is on heparin and without heparin would be at high risk for apical thrombus. Acute respiratory failure: Now intubated followed by pulmonary critical care.  Blood pressure 100/65.  At present not a candidate to initiate GDMT but consider postextubation if blood pressure allows Acute anoxic encephalopathy: Patient has history of narcotic use and chronic pain. 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 History of tobacco use Type 2 diabetes mellitus on sliding scale insulin Anemia: Globin 10.1 hematocrit 31.0 today, relatively stable from yesterday. Lipid therapy: Rosuvastatin started yesterday 20 mg.  LDL 89 on 2023-05-28.  For questions or updates, please contact [facility] Please consult [email or web address] for contact info under    CC:35 minutes Signed, Nicki Guadalajara, MD  05/05/2023, 9:25 AM

## 2023-05-05 NOTE — Plan of Care (Signed)
  Problem: Education: Goal: Ability to describe self-care measures that may prevent or decrease complications (Diabetes Survival Skills Education) will improve Outcome: Progressing Goal: Individualized Educational Video(s) Outcome: Progressing   Problem: Coping: Goal: Ability to adjust to condition or change in health will improve Outcome: Progressing   Problem: Fluid Volume: Goal: Ability to maintain a balanced intake and output will improve Outcome: Progressing   Problem: Health Behavior/Discharge Planning: Goal: Ability to identify and utilize available resources and services will improve Outcome: Progressing Goal: Ability to manage health-related needs will improve Outcome: Progressing   Problem: Metabolic: Goal: Ability to maintain appropriate glucose levels will improve Outcome: Progressing   Problem: Nutritional: Goal: Maintenance of adequate nutrition will improve Outcome: Progressing Goal: Progress toward achieving an optimal weight will improve Outcome: Progressing   Problem: Skin Integrity: Goal: Risk for impaired skin integrity will decrease Outcome: Progressing   Problem: Tissue Perfusion: Goal: Adequacy of tissue perfusion will improve Outcome: Progressing   Problem: Education: Goal: Knowledge of General Education information will improve Description: Including pain rating scale, medication(s)/side effects and non-pharmacologic comfort measures Outcome: Progressing   Problem: Health Behavior/Discharge Planning: Goal: Ability to manage health-related needs will improve Outcome: Progressing   Problem: Clinical Measurements: Goal: Ability to maintain clinical measurements within normal limits will improve Outcome: Progressing Goal: Will remain free from infection Outcome: Progressing Goal: Diagnostic test results will improve Outcome: Progressing Goal: Respiratory complications will improve Outcome: Progressing Goal: Cardiovascular complication will  be avoided Outcome: Progressing   Problem: Activity: Goal: Risk for activity intolerance will decrease Outcome: Progressing   Problem: Nutrition: Goal: Adequate nutrition will be maintained Outcome: Progressing   Problem: Coping: Goal: Level of anxiety will decrease Outcome: Progressing   Problem: Elimination: Goal: Will not experience complications related to bowel motility Outcome: Progressing Goal: Will not experience complications related to urinary retention Outcome: Progressing   Problem: Pain Management: Goal: General experience of comfort will improve Outcome: Progressing   Problem: Safety: Goal: Ability to remain free from injury will improve Outcome: Progressing   Problem: Skin Integrity: Goal: Risk for impaired skin integrity will decrease Outcome: Progressing   Problem: Safety: Goal: Non-violent Restraint(s) Outcome: Progressing

## 2023-05-05 NOTE — Progress Notes (Addendum)
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  She was found unresponsive around 2100 by family after last being seen normal around 1200 at which time she was laying down for a nap.  Shallow respirations noted.  Unresponsive to Narcan.  ECG performed that showed inferoposterior ST elevations.  She was intubated for airway protection in the ED and has been seen by interventional cardiology.  No urgent cardiac catheterization is planned, aspirin, heparin ordered pending head CT results.  Troponin 6562.  Chest x-ray reviewed by me, clear without any infiltrates, ET tube in good position.   Pertinent  Medical History   Past Medical History:  Diagnosis Date   Arthritis    Cancer (HCC)    Throat cancer   Cough    GREEN SPUTUM    Diabetes mellitus    Type II   History of kidney stones    passed   Osteomyelitis of foot, left, acute (HCC) 11/09/2013    Significant Hospital Events: Including procedures, antibiotic start and stop dates in addition to other pertinent events   2023-05-30 admitted w/ ams and intubated for airway protection  Interim History / Subjective:  Patient off sedation since yesterday Patient lethargic but follows intermittent commands (wiggles toes, squeezes fingers) On SBT WBC today 20 from 9.7; remains afebrile Echo yesterday w/ LVEF <20%; global hypokinesis\  Objective   Blood pressure 100/63, pulse 91, temperature 97.9 F (36.6 C), temperature source Axillary, resp. rate 17, height 5\' 7"  (1.702 m), weight 68.3 kg, SpO2 100%.    Vent Mode: CPAP;PSV FiO2 (%):  [40 %] 40 % Set Rate:  [15 bmp-16 bmp]  16 bmp Vt Set:  [490 mL] 490 mL PEEP:  [5 cmH20] 5 cmH20 Pressure Support:  [8 cmH20-10 cmH20] 10 cmH20 Plateau Pressure:  [14 cmH20-18 cmH20] 14 cmH20   Intake/Output Summary (Last 24 hours) at 05/05/2023 0939 Last data filed at 05/05/2023 0800 Gross per 24 hour  Intake 774.38 ml  Output 290 ml  Net 484.38 ml   Filed Weights   05/04/23 0023 05/04/23 0500 05/05/23 0500  Weight: 68.9 kg 68.9 kg 68.3 kg    Examination: General: critically ill appearing on mech vent HEENT: MM pink/moist; ETT in place Neuro: off sedation; lethargic but following intermittent commands; cough/gag present; perrl CV: s1s2, RRR, no m/r/g PULM:  dim clear BS bilaterally; on mech vent PSV GI: soft, bsx4 active  Extremities: cool/dry, no edema; rue port in place  Resolved Hospital Problem list     Assessment & Plan:   Acute encephalopathy, unclear etiology.  Given history of narcotics/benzo use and chronic pain consider toxic ingestion; she did not respond to Narcan.  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 -uds, ammonia, tsh negative Plan: -limit sedating meds -off sedation  Acute respiratory failure due to impaired airway protection due to the above Plan: -doing well on SBT but given poor mental status and Thick ETT secretions will hold off on extubation today -Wean PEEP/FiO2 for SpO2 >92% -VAP bundle in place -Daily SAT and SBT -PAD protocol  in place -wean sedation for RASS goal 0 to -1  Leukocytosis: increased secretions from ETT; resp cultures 10/29 showing gram positive cocci in pairs -UA and CXR wnl on 10/28; wbc 20 today from 9 yesterday Plan: -check bcx2 and f/u trach aspirate from yesterday -start on rocephin for possible strep pna -trend wbc/fever curve  ACS with apparent STEMI Severe ischemic cardiomyopathy: lvef <20% on  echo 10/29 Plan: -cards following; appreciate recs -UOP last 24 hours 265; lasix x1 -tele monitoring -heart cath when stable -cont heparin -cont asa and statin  Chronic pain with narcotics use Plan: -hold home meds w/ encephalopathy  DMT2 -A1c 7.2 Plan: -ssi and cbg monitoring  Hx of anxiety? Plan: -hold home klonopin  Hx of subclinical hypothyroidism -tsh wnl Plan: -cont home synthroid  Anemia Plan: -trend cbc  History of pharyngeal cancer on observation Plan: -supportive care -f/u outpt w/ onc  At risk malnutrition Plan: -TF per RD  Best Practice (right click and "Reselect all SmartList Selections" daily)   Diet/type: tubefeeds DVT prophylaxis: systemic heparin GI prophylaxis: H2B Lines: N/A Foley:  Yes, and it is still needed Code Status:  full code Last date of multidisciplinary goals of care discussion [10/30 family updated at bedside on plan of care]  Labs   CBC: Recent Labs  Lab 04/08/2023 2205 04/20/2023 2342 05/04/23 0302 05/04/23 0441 05/05/23 0204  WBC 8.5  --  9.7  --  20.0*  NEUTROABS 5.6  --   --   --   --   HGB 11.2* 10.9* 10.8* 10.5* 10.1*  HCT 33.6* 32.0* 33.2* 31.0* 31.1*  MCV 91.8  --  91.2  --  91.7  PLT 252  --  234  --  248    Basic Metabolic Panel: Recent Labs  Lab 04/10/2023 2205 04/19/2023 2342 05/04/23 0302 05/04/23 0441 05/04/23 1545 05/05/23 0203  NA 134* 133* 134* 135  --   --   K 4.4 4.0 3.8 3.8  --   --   CL 99  --  99  --   --   --   CO2 27  --  25  --   --   --   GLUCOSE 205*  --  160*  --   --   --   BUN 39*  --  37*  --   --   --   CREATININE 0.91  --  0.87  --   --   --   CALCIUM 8.1*  --  8.2*  --   --   --   MG  --   --  2.4  --  2.2 2.2  PHOS  --   --  3.2  --  3.0 2.6   GFR: Estimated Creatinine Clearance: 59.3 mL/min (by C-G formula based on SCr of 0.87 mg/dL). Recent Labs  Lab 05/05/2023 2205 04/23/2023 2218 05/04/23 0302 05/05/23 0204  WBC 8.5  --  9.7 20.0*  LATICACIDVEN  --  1.5  --   --      Liver Function Tests: Recent Labs  Lab 04/10/2023 2205  AST 32  ALT 44  ALKPHOS 85  BILITOT 0.7  PROT 6.5  ALBUMIN 2.7*   No results for input(s): "LIPASE", "AMYLASE" in the last 168 hours. Recent Labs  Lab 05/04/23 1100  AMMONIA 16    ABG    Component Value Date/Time   PHART 7.359 05/04/2023 0441   PCO2ART 44.8 05/04/2023 0441   PO2ART 90 05/04/2023 0441   HCO3 25.2  05/04/2023 0441   TCO2 27 05/04/2023 0441   ACIDBASEDEF 1.0 05/13/2023 2342   O2SAT 97 05/04/2023 0441     Coagulation Profile: Recent Labs  Lab 2023-05-13 2205  INR 1.2    Cardiac Enzymes: No results for input(s): "CKTOTAL", "CKMB", "CKMBINDEX", "TROPONINI" in the last 168 hours.  HbA1C: Hgb A1c MFr Bld  Date/Time Value Ref Range Status  May 13, 2023 10:05 PM 7.2 (H) 4.8 - 5.6 % Final    Comment:    (NOTE) Pre diabetes:          5.7%-6.4%  Diabetes:              >6.4%  Glycemic control for   <7.0% adults with diabetes   04/09/2023 10:20 AM 6.7 (H) 4.8 - 5.6 % Final    Comment:             Prediabetes: 5.7 - 6.4          Diabetes: >6.4          Glycemic control for adults with diabetes: <7.0     CBG: Recent Labs  Lab 05/04/23 1709 05/04/23 2015 05/04/23 2313 05/05/23 0425 05/05/23 0729  GLUCAP 144* 151* 158* 192* 169*    Review of Systems:   60  Past Medical History:  She,  has a past medical history of Arthritis, Cancer (HCC), Cough, Diabetes mellitus, History of kidney stones, and Osteomyelitis of foot, left, acute (HCC) (11/09/2013).   Surgical History:   Past Surgical History:  Procedure Laterality Date   BREAST SURGERY Left    Left breast biopsy, negative   CESAREAN SECTION     CHOLECYSTECTOMY     HIP ARTHROPLASTY  03/16/2011   Procedure: Right ARTHROPLASTY BIPOLAR HIP;  Surgeon: Darreld Mclean;  Location: [facility];  Service: Orthopedics;  Laterality: Right;   LARYNGOSCOPY AND ESOPHAGOSCOPY N/A 07/29/2018   Procedure: DIRECT LARYNGOSCOPY AND ESOPHAGOSCOPY WITH BIOPSY;   Surgeon: Serena Colonel, MD;  Location[facility];  Service: ENT;  Laterality: N/A;   LUMBAR LAMINECTOMY/DECOMPRESSION MICRODISCECTOMY Left 05/16/2015   Procedure: Laminectomy and Foraminotomy - L4-L5 - left, resection of synovial cyst;  Surgeon: Tia Alert, MD;  Location: [facility];  Service: Neurosurgery;  Laterality: Left;  Laminectomy and Foraminotomy - L4-L5 - left, resection of synovial cyst     Social History:   reports that she has been smoking cigarettes. She started smoking about 54 years ago. She has a 39.5 pack-year smoking history. She has never used smokeless tobacco. She reports that she does not drink alcohol and does not use drugs.   Family History:  Her family history includes Diabetes in her mother. There is no history of Colon cancer.   Allergies Allergies  Allergen Reactions   Morphine And Codeine Nausea And Vomiting   Tramadol Nausea Only     Home Medications  Prior to Admission medications   Medication Sig Start Date End Date Taking? Authorizing Provider  albuterol (PROVENTIL HFA;VENTOLIN HFA) 108 (90 Base) MCG/ACT inhaler Inhale 2 puffs into the lungs every 6 (six) hours as needed for wheezing or shortness of breath.     [provider]  clonazePAM (KLONOPIN) 1 MG tablet Take 1 mg by mouth at bedtime. 09/02/19   [provider]  cyclobenzaprine (FLEXERIL) 10 MG tablet Take 10 mg by mouth 3 (three) times daily as needed for muscle spasms. 10/16/19   [provider]  HYDROcodone-acetaminophen (NORCO) 7.5-325 MG tablet Take 1 tablet by mouth every 6 (six) hours as needed (pain.).  04/09/23   Gilmore Laroche, FNP  levothyroxine (SYNTHROID) 50 MCG tablet Take 50 mcg by mouth daily before breakfast. 04/22/20   [provider]  metFORMIN (GLUCOPHAGE) 1000 MG tablet Take 1 tablet (1,000 mg total) by mouth daily. 04/16/23   Gilmore Laroche, FNP  terbinafine (LAMISIL) 1 % cream Apply 1 Application topically 2 (two) times daily. 04/09/23    Gilmore Laroche, FNP  Vitamin D, Ergocalciferol, (DRISDOL) 1.25 MG (50000 UNIT) CAPS capsule Take 1 capsule (50,000 Units total) by mouth every 7 (seven) days. 04/16/23   Gilmore Laroche, FNP     Critical care time: 35 min    JD Daryel [facility] 05/05/2023, 9:39 AM  Please see [email or web address] for pager details.  From 7A-7P if no response, please call [phone]. After hours, please call ELink [phone].

## 2023-05-05 NOTE — Progress Notes (Signed)
PHARMACY - ANTICOAGULATION CONSULT NOTE  Pharmacy Consult for heparin Indication:  ACS/STEMI  Allergies  Allergen Reactions   Morphine And Codeine Nausea And Vomiting   Tramadol Nausea Only    Patient Measurements: Height: 5\' 7"  (170.2 cm) Weight: 68.9 kg (151 lb 14.4 oz) IBW/kg (Calculated) : 61.6  Vital Signs: Temp: 98.9 F (37.2 C) (10/30 0000) Temp Source: Oral (10/30 0000) BP: 104/62 (10/30 0200) Pulse Rate: 89 (10/30 0200)  Labs: Recent Labs    05/03/23 2205 05/03/23 2342 05/04/23 0302 05/04/23 0441 05/04/23 1100 05/04/23 1805 05/05/23 0203 05/05/23 0204  HGB 11.2*   < > 10.8* 10.5*  --   --   --  10.1*  HCT 33.6*   < > 33.2* 31.0*  --   --   --  31.1*  PLT 252  --  234  --   --   --   --  248  APTT 22*  --   --   --   --   --   --   --   LABPROT 15.7*  --   --   --   --   --   --   --   INR 1.2  --   --   --   --   --   --   --   HEPARINUNFRC  --   --   --   --  0.11* 0.13* 0.30  --   CREATININE 0.91  --  0.87  --   --   --   --   --   TROPONINIHS 4,010*  --  7,214*  --   --   --   --   --    < > = values in this interval not displayed.    Estimated Creatinine Clearance: 59.3 mL/min (by C-G formula based on SCr of 0.87 mg/dL).   Medical History: Past Medical History:  Diagnosis Date   Arthritis    Cancer (HCC)    Throat cancer   Cough    GREEN SPUTUM    Diabetes mellitus    Type II   History of kidney stones    passed   Osteomyelitis of foot, left, acute (HCC) 11/09/2013    Medications:  Medications Prior to Admission  Medication Sig Dispense Refill Last Dose   albuterol (PROVENTIL HFA;VENTOLIN HFA) 108 (90 Base) MCG/ACT inhaler Inhale 2 puffs into the lungs every 6 (six) hours as needed for wheezing or shortness of breath.    Past Week   clonazePAM (KLONOPIN) 1 MG tablet Take 1 mg by mouth at bedtime.   Past Week   cyclobenzaprine (FLEXERIL) 10 MG tablet Take 10 mg by mouth 3 (three) times daily as needed for muscle spasms.   Past Week    levothyroxine (SYNTHROID) 50 MCG tablet Take 50 mcg by mouth daily before breakfast.   Past Week   terbinafine (LAMISIL) 1 % cream Apply 1 Application topically 2 (two) times daily. 30 g 0 Past Week   Vitamin D, Ergocalciferol, (DRISDOL) 1.25 MG (50000 UNIT) CAPS capsule Take 1 capsule (50,000 Units total) by mouth every 7 (seven) days. 20 capsule 1 Past Week   HYDROcodone-acetaminophen (NORCO) 7.5-325 MG tablet Take 1 tablet by mouth every 6 (six) hours as needed (pain.). (Patient not taking: Reported on 05/04/2023) 20 tablet 0 Not Taking   metFORMIN (GLUCOPHAGE) 1000 MG tablet Take 1 tablet (1,000 mg total) by mouth daily. (Patient not taking: Reported on 05/04/2023) 90 tablet 1 Not Taking  Scheduled:   aspirin  81 mg Per Tube Daily   Chlorhexidine Gluconate Cloth  6 each Topical Q0600   docusate  100 mg Per Tube BID   famotidine  20 mg Per Tube BID   insulin aspart  0-15 Units Subcutaneous Q4H   levothyroxine  50 mcg Per Tube QAC breakfast   mouth rinse  15 mL Mouth Rinse Q2H   polyethylene glycol  17 g Per Tube Daily   rosuvastatin  20 mg Per Tube Daily   sodium chloride flush  10-40 mL Intracatheter Q12H   Infusions:   feeding supplement (VITAL AF 1.2 CAL) 20 mL/hr at 05/04/23 1901   fentaNYL infusion INTRAVENOUS Stopped (05/04/23 1100)   heparin 1,200 Units/hr (05/05/23 0042)   propofol (DIPRIVAN) infusion Stopped (05/04/23 1124)    Assessment: 67yo female was found unresponsive at home, EKG showed STEMI. Pharmacy dosing heparin  heparin level returned at 0.3 (therapeutic) on 1200 units/hr. Per RN, no issues with the heparin infusion running continuously or signs/symptoms of bleeding. CBC shows Hgb and plts stable   Goal of Therapy:  Heparin level 0.3-0.7 units/ml Monitor platelets by anticoagulation protocol: Yes   Plan:   -Increase heparin infusion to 1300 units/hr to keep within goal range -Confirmatory Heparin level in 6 hours and daily wth CBC daily  Arabella Merles,  PharmD. Clinical Pharmacist 05/05/2023 2:46 AM

## 2023-05-05 NOTE — Progress Notes (Signed)
PHARMACY - ANTICOAGULATION CONSULT NOTE  Pharmacy Consult for heparin Indication:  ACS/STEMI  Allergies  Allergen Reactions   Morphine And Codeine Nausea And Vomiting   Tramadol Nausea Only    Patient Measurements: Height: 5\' 7"  (170.2 cm) Weight: 68.3 kg (150 lb 9.2 oz) IBW/kg (Calculated) : 61.6  Vital Signs: Temp: 98.5 F (36.9 C) (10/30 1600) Temp Source: Axillary (10/30 1600) BP: 93/58 (10/30 1900) Pulse Rate: 96 (10/30 1800)  Labs: Recent Labs    05/03/23 2205 05/03/23 2342 05/04/23 0302 05/04/23 0441 05/04/23 1100 05/05/23 0203 05/05/23 0204 05/05/23 0948 05/05/23 1035 05/05/23 1834  HGB 11.2*   < > 10.8* 10.5*  --   --  10.1*  --   --   --   HCT 33.6*   < > 33.2* 31.0*  --   --  31.1*  --   --   --   PLT 252  --  234  --   --   --  248  --   --   --   APTT 22*  --   --   --   --   --   --   --   --   --   LABPROT 15.7*  --   --   --   --   --   --   --   --   --   INR 1.2  --   --   --   --   --   --   --   --   --   HEPARINUNFRC  --   --   --   --    < > 0.30  --  0.21*  --  0.29*  CREATININE 0.91  --  0.87  --   --   --   --   --  0.86  --   TROPONINIHS 6,562*  --  7,214*  --   --   --   --   --   --   --    < > = values in this interval not displayed.    Estimated Creatinine Clearance: 60 mL/min (by C-G formula based on SCr of 0.86 mg/dL).   Medical History: Past Medical History:  Diagnosis Date   Arthritis    Cancer (HCC)    Throat cancer   Cough    GREEN SPUTUM    Diabetes mellitus    Type II   History of kidney stones    passed   Osteomyelitis of foot, left, acute (HCC) 11/09/2013    Medications:  Medications Prior to Admission  Medication Sig Dispense Refill Last Dose   albuterol (PROVENTIL HFA;VENTOLIN HFA) 108 (90 Base) MCG/ACT inhaler Inhale 2 puffs into the lungs every 6 (six) hours as needed for wheezing or shortness of breath.    Past Week   clonazePAM (KLONOPIN) 1 MG tablet Take 1 mg by mouth at bedtime.   Past Week    cyclobenzaprine (FLEXERIL) 10 MG tablet Take 10 mg by mouth 3 (three) times daily as needed for muscle spasms.   Past Week   levothyroxine (SYNTHROID) 50 MCG tablet Take 50 mcg by mouth daily before breakfast.   Past Week   terbinafine (LAMISIL) 1 % cream Apply 1 Application topically 2 (two) times daily. 30 g 0 Past Week   Vitamin D, Ergocalciferol, (DRISDOL) 1.25 MG (50000 UNIT) CAPS capsule Take 1 capsule (50,000 Units total) by mouth every 7 (seven) days. 20 capsule 1 Past Week  HYDROcodone-acetaminophen (NORCO) 7.5-325 MG tablet Take 1 tablet by mouth every 6 (six) hours as needed (pain.). (Patient not taking: Reported on 05/04/2023) 20 tablet 0 Not Taking   metFORMIN (GLUCOPHAGE) 1000 MG tablet Take 1 tablet (1,000 mg total) by mouth daily. (Patient not taking: Reported on 05/04/2023) 90 tablet 1 Not Taking   Scheduled:   aspirin  81 mg Per Tube Daily   Chlorhexidine Gluconate Cloth  6 each Topical Q0600   docusate  100 mg Per Tube BID   famotidine  20 mg Per Tube BID   insulin aspart  0-15 Units Subcutaneous Q4H   levothyroxine  50 mcg Per Tube QAC breakfast   mouth rinse  15 mL Mouth Rinse Q2H   polyethylene glycol  17 g Per Tube Daily   rosuvastatin  20 mg Per Tube Daily   sodium chloride flush  10-40 mL Intracatheter Q12H   Infusions:   cefTRIAXone (ROCEPHIN)  IV Stopped (05/05/23 1026)   feeding supplement (VITAL AF 1.2 CAL) 60 mL/hr at 05/05/23 1800   fentaNYL infusion INTRAVENOUS Stopped (05/04/23 1100)   heparin 1,400 Units/hr (05/05/23 1800)    Assessment: 67yo female was found unresponsive at home, EKG showed STEMI. Pharmacy dosing heparin  Heparin level just slightly subtherapeutic (0.29) on infusion at 1400 units/hr. No issues with line or bleeding reported per RN.   Goal of Therapy:  Heparin level 0.3-0.7 units/ml Monitor platelets by anticoagulation protocol: Yes   Plan:   Increase heparin infusion to 1500 units/hr. Will f/u 6 hr heparin level  Christoper Fabian,  PharmD, BCPS Please see [email or web address] for complete clinical pharmacist phone list  05/05/2023 7:53 PM

## 2023-05-05 NOTE — Progress Notes (Signed)
PHARMACY - ANTICOAGULATION CONSULT NOTE  Pharmacy Consult for heparin Indication:  ACS/STEMI  Allergies  Allergen Reactions   Morphine And Codeine Nausea And Vomiting   Tramadol Nausea Only    Patient Measurements: Height: 5\' 7"  (170.2 cm) Weight: 68.3 kg (150 lb 9.2 oz) IBW/kg (Calculated) : 61.6  Vital Signs: Temp: 97.9 F (36.6 C) (10/30 0730) Temp Source: Axillary (10/30 0730) BP: 100/63 (10/30 0700) Pulse Rate: 91 (10/30 0800)  Labs: Recent Labs    05/03/23 2205 05/03/23 2342 05/04/23 0302 05/04/23 0441 05/04/23 1100 05/04/23 1805 05/05/23 0203 05/05/23 0204 05/05/23 0948  HGB 11.2*   < > 10.8* 10.5*  --   --   --  10.1*  --   HCT 33.6*   < > 33.2* 31.0*  --   --   --  31.1*  --   PLT 252  --  234  --   --   --   --  248  --   APTT 22*  --   --   --   --   --   --   --   --   LABPROT 15.7*  --   --   --   --   --   --   --   --   INR 1.2  --   --   --   --   --   --   --   --   HEPARINUNFRC  --   --   --   --    < > 0.13* 0.30  --  0.21*  CREATININE 0.91  --  0.87  --   --   --   --   --   --   TROPONINIHS 6,562*  --  7,214*  --   --   --   --   --   --    < > = values in this interval not displayed.    Estimated Creatinine Clearance: 59.3 mL/min (by C-G formula based on SCr of 0.87 mg/dL).   Medical History: Past Medical History:  Diagnosis Date   Arthritis    Cancer (HCC)    Throat cancer   Cough    GREEN SPUTUM    Diabetes mellitus    Type II   History of kidney stones    passed   Osteomyelitis of foot, left, acute (HCC) 11/09/2013    Medications:  Medications Prior to Admission  Medication Sig Dispense Refill Last Dose   albuterol (PROVENTIL HFA;VENTOLIN HFA) 108 (90 Base) MCG/ACT inhaler Inhale 2 puffs into the lungs every 6 (six) hours as needed for wheezing or shortness of breath.    Past Week   clonazePAM (KLONOPIN) 1 MG tablet Take 1 mg by mouth at bedtime.   Past Week   cyclobenzaprine (FLEXERIL) 10 MG tablet Take 10 mg by mouth 3  (three) times daily as needed for muscle spasms.   Past Week   levothyroxine (SYNTHROID) 50 MCG tablet Take 50 mcg by mouth daily before breakfast.   Past Week   terbinafine (LAMISIL) 1 % cream Apply 1 Application topically 2 (two) times daily. 30 g 0 Past Week   Vitamin D, Ergocalciferol, (DRISDOL) 1.25 MG (50000 UNIT) CAPS capsule Take 1 capsule (50,000 Units total) by mouth every 7 (seven) days. 20 capsule 1 Past Week   HYDROcodone-acetaminophen (NORCO) 7.5-325 MG tablet Take 1 tablet by mouth every 6 (six) hours as needed (pain.). (Patient not taking: Reported on 05/04/2023) 20 tablet  0 Not Taking   metFORMIN (GLUCOPHAGE) 1000 MG tablet Take 1 tablet (1,000 mg total) by mouth daily. (Patient not taking: Reported on 05/04/2023) 90 tablet 1 Not Taking   Scheduled:   aspirin  81 mg Per Tube Daily   Chlorhexidine Gluconate Cloth  6 each Topical Q0600   docusate  100 mg Per Tube BID   famotidine  20 mg Per Tube BID   insulin aspart  0-15 Units Subcutaneous Q4H   levothyroxine  50 mcg Per Tube QAC breakfast   mouth rinse  15 mL Mouth Rinse Q2H   polyethylene glycol  17 g Per Tube Daily   rosuvastatin  20 mg Per Tube Daily   sodium chloride flush  10-40 mL Intracatheter Q12H   Infusions:   cefTRIAXone (ROCEPHIN)  IV 2 g (05/05/23 0955)   feeding supplement (VITAL AF 1.2 CAL) 40 mL/hr at 05/05/23 0801   fentaNYL infusion INTRAVENOUS Stopped (05/04/23 1100)   heparin 1,300 Units/hr (05/05/23 0800)    Assessment: 67yo female was found unresponsive at home, EKG showed STEMI. Pharmacy dosing heparin  Heparin level this morning is slightly below goal at 0.21. Per RN, no issues with the heparin infusion running continuously or signs/symptoms of bleeding. CBC shows Hgb and plts stable   Goal of Therapy:  Heparin level 0.3-0.7 units/ml Monitor platelets by anticoagulation protocol: Yes   Plan:   -Increase heparin infusion to 1400 units/hr. -Confirmatory Heparin level in 8 hours and daily wth  CBC daily -F/u plans for cath lab eventually.  Reece Leader, Colon Flattery, [facility] Clinical Pharmacist  05/05/2023 10:49 AM   [facility] pharmacy phone numbers are listed on [email or web address]

## 2023-05-06 DIAGNOSIS — E440 Moderate protein-calorie malnutrition: Secondary | ICD-10-CM | POA: Diagnosis not present

## 2023-05-06 DIAGNOSIS — I2119 ST elevation (STEMI) myocardial infarction involving other coronary artery of inferior wall: Secondary | ICD-10-CM | POA: Diagnosis not present

## 2023-05-06 DIAGNOSIS — J9601 Acute respiratory failure with hypoxia: Secondary | ICD-10-CM | POA: Diagnosis not present

## 2023-05-06 DIAGNOSIS — R4182 Altered mental status, unspecified: Secondary | ICD-10-CM

## 2023-05-06 DIAGNOSIS — G9340 Encephalopathy, unspecified: Secondary | ICD-10-CM | POA: Diagnosis not present

## 2023-05-06 DIAGNOSIS — D72829 Elevated white blood cell count, unspecified: Secondary | ICD-10-CM | POA: Diagnosis not present

## 2023-05-06 LAB — CBC
HCT: 28.4 % — ABNORMAL LOW (ref 36.0–46.0)
Hemoglobin: 9.1 g/dL — ABNORMAL LOW (ref 12.0–15.0)
MCH: 29.5 pg (ref 26.0–34.0)
MCHC: 32 g/dL (ref 30.0–36.0)
MCV: 92.2 fL (ref 80.0–100.0)
Platelets: 221 10*3/uL (ref 150–400)
RBC: 3.08 MIL/uL — ABNORMAL LOW (ref 3.87–5.11)
RDW: 14.1 % (ref 11.5–15.5)
WBC: 17.9 10*3/uL — ABNORMAL HIGH (ref 4.0–10.5)
nRBC: 0.1 % (ref 0.0–0.2)

## 2023-05-06 LAB — GLUCOSE, CAPILLARY
Glucose-Capillary: 264 mg/dL — ABNORMAL HIGH (ref 70–99)
Glucose-Capillary: 253 mg/dL — ABNORMAL HIGH (ref 70–99)
Glucose-Capillary: 152 mg/dL — ABNORMAL HIGH (ref 70–99)
Glucose-Capillary: 79 mg/dL (ref 70–99)

## 2023-05-06 LAB — HEPARIN LEVEL (UNFRACTIONATED)
Heparin Unfractionated: 0.28 [IU]/mL — ABNORMAL LOW (ref 0.30–0.70)
Heparin Unfractionated: 0.42 [IU]/mL (ref 0.30–0.70)

## 2023-05-06 LAB — BASIC METABOLIC PANEL
Anion gap: 11 (ref 5–15)
BUN: 42 mg/dL — ABNORMAL HIGH (ref 8–23)
CO2: 26 mmol/L (ref 22–32)
Calcium: 8 mg/dL — ABNORMAL LOW (ref 8.9–10.3)
Chloride: 96 mmol/L — ABNORMAL LOW (ref 98–111)
Creatinine, Ser: 0.74 mg/dL (ref 0.44–1.00)
GFR, Estimated: >60 mL/min (ref >60)
Glucose, Bld: 278 mg/dL — ABNORMAL HIGH (ref 70–99)
Potassium: 3.5 mmol/L (ref 3.5–5.1)
Sodium: 133 mmol/L — ABNORMAL LOW (ref 135–145)

## 2023-05-06 LAB — CULTURE, RESPIRATORY W GRAM STAIN

## 2023-05-06 LAB — PHOSPHORUS: Phosphorus: 2.8 mg/dL (ref 2.5–4.6)

## 2023-05-06 LAB — MAGNESIUM: Magnesium: 1.9 mg/dL (ref 1.7–2.4)

## 2023-05-06 MED ORDER — MORPHINE 100MG IN NS 100ML (1MG/ML) PREMIX INFUSION
0.0000 mg/h | INTRAVENOUS | Status: DC
  Administered 2023-05-06: 5 mg/h via INTRAVENOUS
  Filled 2023-05-06: qty 100

## 2023-05-06 MED ORDER — MORPHINE BOLUS VIA INFUSION
5.0000 mg | INTRAVENOUS | Status: DC | PRN
Start: 2023-05-06 — End: 2023-05-07
  Administered 2023-05-06: 5 mg via INTRAVENOUS

## 2023-05-06 MED ORDER — INSULIN ASPART 100 UNIT/ML IJ SOLN: 4.0000 [IU] | INTRAMUSCULAR | Status: DC

## 2023-05-06 MED ORDER — POTASSIUM CHLORIDE 20 MEQ PO PACK
40.0000 meq | PACK | Freq: Once | ORAL | Status: AC
Start: 2023-05-06 — End: 2023-05-06
  Administered 2023-05-06: 40 meq
  Filled 2023-05-06: qty 2

## 2023-05-06 MED ORDER — ACETAMINOPHEN 650 MG RE SUPP: 650.0000 mg | Freq: Four times a day (QID) | RECTAL | Status: DC | PRN

## 2023-05-06 MED ORDER — RACEPINEPHRINE HCL 2.25 % IN NEBU
INHALATION_SOLUTION | RESPIRATORY_TRACT | Status: AC
  Administered 2023-05-06: 0.5 mL via RESPIRATORY_TRACT
  Filled 2023-05-06: qty 0.5

## 2023-05-06 MED ORDER — GLYCOPYRROLATE 0.2 MG/ML IJ SOLN: 0.2000 mg | INTRAMUSCULAR | Status: DC | PRN

## 2023-05-06 MED ORDER — GLYCOPYRROLATE 0.2 MG/ML IJ SOLN
0.2000 mg | INTRAMUSCULAR | Status: DC | PRN
  Administered 2023-05-06 – 2023-05-07 (×2): 0.2 mg via INTRAVENOUS
  Filled 2023-05-06 (×2): qty 1

## 2023-05-06 MED ORDER — RACEPINEPHRINE HCL 2.25 % IN NEBU
0.5000 mL | INHALATION_SOLUTION | RESPIRATORY_TRACT | Status: DC | PRN
  Administered 2023-05-06: 0.5 mL via RESPIRATORY_TRACT

## 2023-05-06 MED ORDER — GLYCOPYRROLATE 1 MG PO TABS: 1.0000 mg | ORAL_TABLET | ORAL | Status: DC | PRN

## 2023-05-06 MED ORDER — DIPHENHYDRAMINE HCL 50 MG/ML IJ SOLN: 25.0000 mg | INTRAMUSCULAR | Status: DC | PRN

## 2023-05-06 MED ORDER — ORAL CARE MOUTH RINSE
15.0000 mL | OROMUCOSAL | Status: DC
Start: 2023-05-06 — End: 2023-05-06
  Administered 2023-05-06: 15 mL via OROMUCOSAL

## 2023-05-06 MED ORDER — MAGNESIUM SULFATE 2 GM/50ML IV SOLN
2.0000 g | Freq: Once | INTRAVENOUS | Status: AC
  Administered 2023-05-06: 2 g via INTRAVENOUS
  Filled 2023-05-06: qty 50

## 2023-05-06 MED ORDER — MORPHINE SULFATE (PF) 2 MG/ML IV SOLN
1.0000 mg | INTRAVENOUS | Status: DC | PRN
  Administered 2023-05-06: 1 mg via INTRAVENOUS
  Filled 2023-05-06: qty 1

## 2023-05-06 MED ORDER — ACETAMINOPHEN 325 MG PO TABS: 650.0000 mg | ORAL_TABLET | Freq: Four times a day (QID) | ORAL | Status: DC | PRN

## 2023-05-06 MED ORDER — RACEPINEPHRINE HCL 2.25 % IN NEBU: 0.5000 mL | INHALATION_SOLUTION | Freq: Once | RESPIRATORY_TRACT | Status: AC

## 2023-05-06 MED ORDER — SODIUM CHLORIDE 3 % IN NEBU
4.0000 mL | INHALATION_SOLUTION | Freq: Every day | RESPIRATORY_TRACT | Status: DC
  Administered 2023-05-06: 4 mL via RESPIRATORY_TRACT
  Filled 2023-05-06: qty 4

## 2023-05-06 MED ORDER — RACEPINEPHRINE HCL 2.25 % IN NEBU
INHALATION_SOLUTION | RESPIRATORY_TRACT | Status: AC
  Filled 2023-05-06: qty 0.5

## 2023-05-06 MED ORDER — POLYVINYL ALCOHOL 1.4 % OP SOLN: 1.0000 [drp] | Freq: Four times a day (QID) | OPHTHALMIC | Status: DC | PRN

## 2023-05-06 MED ORDER — SORBITOL 70 % SOLN: 30.0000 mL | Freq: Once | Status: DC

## 2023-05-06 MED ORDER — INSULIN GLARGINE-YFGN 100 UNIT/ML ~~LOC~~ SOLN
5.0000 [IU] | Freq: Two times a day (BID) | SUBCUTANEOUS | Status: DC
  Filled 2023-05-06 (×3): qty 0.05

## 2023-05-06 NOTE — Progress Notes (Signed)
[facility] ADULT ICU REPLACEMENT PROTOCOL   The patient does apply for the [facility] Adult ICU Electrolyte Replacment Protocol based on the criteria listed below:   1.Exclusion criteria: TCTS, ECMO, Dialysis, and Myasthenia Gravis patients 2. Is GFR >/= 30 ml/min? Yes.    Patient's GFR today is >60 3. Is SCr </= 2? Yes.   Patient's SCr is 0.74 mg/dL 4. Did SCr increase >/= 0.5 in 24 hours? No. 5.Pt's weight >40kg  Yes.   6. Abnormal electrolyte(s): K+ = 3.5, Mg = 1.9  7. Electrolytes replaced per protocol 8.  Call MD STAT for K+ </= 2.5, Phos </= 1, or Mag </= 1 Physician:  Warrick Parisian, eMD  Brandy Fisher 05/06/2023 6:09 AM

## 2023-05-06 NOTE — Procedures (Signed)
Extubation Procedure Note  Patient Details:   Name: Brandy Fisher DOB: 24-May-1956 MRN: [id number]   Airway Documentation:    Vent end date: 05/06/23 Vent end time: 0928   Evaluation  O2 sats: stable throughout Complications: No apparent complications Patient did tolerate procedure well. Bilateral Breath Sounds: Clear, Diminished   Yes Pt was successfully extubated with no apparent complicaions. Audible cuffleak was heard piror extubation and no signs of stridor were aulscultated at this time. Pt is able to speak and protect airway at this time. Pt is VSS on 4L [name]  [name] 05/06/2023, 9:21 AM

## 2023-05-06 NOTE — Progress Notes (Signed)
PHARMACY - ANTICOAGULATION CONSULT NOTE  Pharmacy Consult for heparin Indication:  ACS/STEMI  Allergies  Allergen Reactions   Morphine And Codeine Nausea And Vomiting   Tramadol Nausea Only    Patient Measurements: Height: 5\' 7"  (170.2 cm) Weight: 68.5 kg (151 lb 0.2 oz) IBW/kg (Calculated) : 61.6  Vital Signs: Temp: 96.8 F (36 C) (10/31 1140) Temp Source: Axillary (10/31 1140) BP: 101/79 (10/31 1200) Pulse Rate: 95 (10/31 1200)  Labs: Recent Labs    05/03/23 2205 05/03/23 2342 05/04/23 0302 05/04/23 0441 05/04/23 1100 05/05/23 0204 05/05/23 0948 05/05/23 1035 05/05/23 1834 05/06/23 0230 05/06/23 0513 05/06/23 1158  HGB 11.2*   < > 10.8* 10.5*  --  10.1*  --   --   --   --  9.1*  --   HCT 33.6*   < > 33.2* 31.0*  --  31.1*  --   --   --   --  28.4*  --   PLT 252  --  234  --   --  248  --   --   --   --  221  --   APTT 22*  --   --   --   --   --   --   --   --   --   --   --   LABPROT 15.7*  --   --   --   --   --   --   --   --   --   --   --   INR 1.2  --   --   --   --   --   --   --   --   --   --   --   HEPARINUNFRC  --   --   --   --    < >  --    < >  --  0.29* 0.28*  --  0.42  CREATININE 0.91  --  0.87  --   --   --   --  0.86  --   --  0.74  --   TROPONINIHS 6,562*  --  7,214*  --   --   --   --   --   --   --   --   --    < > = values in this interval not displayed.    Estimated Creatinine Clearance: 64.5 mL/min (by C-G formula based on SCr of 0.74 mg/dL).   Medical History: Past Medical History:  Diagnosis Date   Arthritis    Cancer (HCC)    Throat cancer   Cough    GREEN SPUTUM    Diabetes mellitus    Type II   History of kidney stones    passed   Osteomyelitis of foot, left, acute (HCC) 11/09/2013    Medications:  Medications Prior to Admission  Medication Sig Dispense Refill Last Dose   albuterol (PROVENTIL HFA;VENTOLIN HFA) 108 (90 Base) MCG/ACT inhaler Inhale 2 puffs into the lungs every 6 (six) hours as needed for wheezing or  shortness of breath.    Past Week   clonazePAM (KLONOPIN) 1 MG tablet Take 1 mg by mouth at bedtime.   Past Week   cyclobenzaprine (FLEXERIL) 10 MG tablet Take 10 mg by mouth 3 (three) times daily as needed for muscle spasms.   Past Week   levothyroxine (SYNTHROID) 50 MCG tablet Take 50 mcg by mouth daily before breakfast.  Past Week   terbinafine (LAMISIL) 1 % cream Apply 1 Application topically 2 (two) times daily. 30 g 0 Past Week   Vitamin D, Ergocalciferol, (DRISDOL) 1.25 MG (50000 UNIT) CAPS capsule Take 1 capsule (50,000 Units total) by mouth every 7 (seven) days. 20 capsule 1 Past Week   HYDROcodone-acetaminophen (NORCO) 7.5-325 MG tablet Take 1 tablet by mouth every 6 (six) hours as needed (pain.). (Patient not taking: Reported on 05/04/2023) 20 tablet 0 Not Taking   metFORMIN (GLUCOPHAGE) 1000 MG tablet Take 1 tablet (1,000 mg total) by mouth daily. (Patient not taking: Reported on 05/04/2023) 90 tablet 1 Not Taking   Scheduled:   aspirin  81 mg Per Tube Daily   Chlorhexidine Gluconate Cloth  6 each Topical Q0600   docusate  100 mg Per Tube BID   famotidine  20 mg Per Tube BID   insulin aspart  0-15 Units Subcutaneous Q4H   insulin glargine-yfgn  5 Units Subcutaneous BID   levothyroxine  50 mcg Per Tube QAC breakfast   mouth rinse  15 mL Mouth Rinse Q4H   polyethylene glycol  17 g Per Tube Daily   rosuvastatin  20 mg Per Tube Daily   sodium chloride HYPERTONIC  4 mL Nebulization Daily   Infusions:   cefTRIAXone (ROCEPHIN)  IV Stopped (05/06/23 1027)   feeding supplement (VITAL AF 1.2 CAL) Stopped (05/06/23 0920)   heparin 1,600 Units/hr (05/06/23 1200)    Assessment: 67yo female was found unresponsive at home, EKG showed STEMI. Pharmacy dosing heparin  -heparin level= 0.42 on 1600 units/hr -hg 9.1  Goal of Therapy:  Heparin level 0.3-0.7 units/ml Monitor platelets by anticoagulation protocol: Yes   Plan:   Continue heparin at 1600 units/hr Daily heparin level and  CBC  Harland German, PharmD Clinical Pharmacist **Pharmacist phone directory can now be found on [email or web address] ([email or web address]).  Listed under [facility].

## 2023-05-06 NOTE — Progress Notes (Signed)
eLink Physician-Brief Progress Note Patient Name: Brandy Fisher DOB: 1955/08/10 MRN: [id number]   Date of Service  05/06/2023  HPI/Events of Note    eICU Interventions  Discussion with family    Discussed with Brandy Fisher Chart reviewed esp notes from [place] earlier Order for comfort measures placed   Intervention Category Major Interventions: Other:  Massie Maroon 05/06/2023, 9:19 PM

## 2023-05-06 NOTE — Progress Notes (Addendum)
Rounding Note    Patient Name: Brandy Fisher Date of Encounter: 05/06/2023  [facility] Cardiologist: None   Subjective   Remains intubated and sedated on vent, opens eyes slightly to voice  Inpatient Medications    Scheduled Meds:  aspirin  81 mg Per Tube Daily   Chlorhexidine Gluconate Cloth  6 each Topical Q0600   docusate  100 mg Per Tube BID   famotidine  20 mg Per Tube BID   insulin aspart  0-15 Units Subcutaneous Q4H   levothyroxine  50 mcg Per Tube QAC breakfast   mouth rinse  15 mL Mouth Rinse Q2H   polyethylene glycol  17 g Per Tube Daily   rosuvastatin  20 mg Per Tube Daily   sodium chloride flush  10-40 mL Intracatheter Q12H   Continuous Infusions:  cefTRIAXone (ROCEPHIN)  IV Stopped (05/05/23 1026)   feeding supplement (VITAL AF 1.2 CAL) 60 mL/hr at 05/06/23 0800   fentaNYL infusion INTRAVENOUS Stopped (05/04/23 1100)   heparin 1,600 Units/hr (05/06/23 0800)   PRN Meds: docusate, fentaNYL, ipratropium-albuterol, midazolam, ondansetron (ZOFRAN) IV, mouth rinse, polyethylene glycol, sodium chloride flush   Vital Signs    Vitals:   05/06/23 0700 05/06/23 0701 05/06/23 0755 05/06/23 0800  BP: 115/68 115/68  95/60  Pulse: 96 95  92  Resp: 19 (!) 22  19  Temp:   98.4 F (36.9 C)   TempSrc:   Oral   SpO2: 99% 99%  97%  Weight:      Height:        Intake/Output Summary (Last 24 hours) at 05/06/2023 0840 Last data filed at 05/06/2023 0800 Gross per 24 hour  Intake 1854.7 ml  Output 1365 ml  Net 489.7 ml      05/06/2023    3:02 AM 05/05/2023    5:00 AM 05/04/2023    5:00 AM  Last 3 Weights  Weight (lbs) 151 lb 0.2 oz 150 lb 9.2 oz 151 lb 14.4 oz  Weight (kg) 68.5 kg 68.3 kg 68.9 kg      Telemetry    Sinus at 93 - Personally Reviewed  ECG    05/05/2023 ECG (independently read by me): Normal sinus rhythm at 87 bpm, inferior ST elevation persists, T wave inversion 1, aVL, V2 through V6.  May 13, 2023 ECG (independently read by  me): Sinus tachycardia at 103 with inferolateral ST elevation in leads II 3, F, V3 through V5  and ST depression  1 and L  Physical Exam   GEN: Intubated on vent; appears older than stated age Neck: No JVD Cardiac: RRR, no murmurs, rubs, or gallops.  Respiratory: no wheezing or rhochi GI: Soft, nontender, non-distended  MS: No edema; No deformity. Foley catheter in place Neuro:  Nonfocal  Psych: unable to assess  Labs    High Sensitivity Troponin:   Recent Labs  Lab 05-13-23 2205 05/04/23 0302  TROPONINIHS 6,562* 7,214*     Chemistry Recent Labs  Lab 2023/05/13 2205 05/13/2023 2342 05/04/23 0302 05/04/23 0441 05/04/23 1545 05/05/23 0203 05/05/23 1035 05/05/23 1645 05/06/23 0513  NA 134*   < > 134* 135  --   --  135  --  133*  K 4.4   < > 3.8 3.8  --   --  3.5  --  3.5  CL 99  --  99  --   --   --  100  --  96*  CO2 27  --  25  --   --   --  25  --  26  GLUCOSE 205*  --  160*  --   --   --  174*  --  278*  BUN 39*  --  37*  --   --   --  41*  --  42*  CREATININE 0.91  --  0.87  --   --   --  0.86  --  0.74  CALCIUM 8.1*  --  8.2*  --   --   --  7.9*  --  8.0*  MG  --   --  2.4  --    < > 2.2  --  1.9 1.9  PROT 6.5  --   --   --   --   --   --   --   --   ALBUMIN 2.7*  --   --   --   --   --   --   --   --   AST 32  --   --   --   --   --   --   --   --   ALT 44  --   --   --   --   --   --   --   --   ALKPHOS 85  --   --   --   --   --   --   --   --   BILITOT 0.7  --   --   --   --   --   --   --   --   GFRNONAA >60  --  >60  --   --   --  >60  --  >60  ANIONGAP 8  --  10  --   --   --  10  --  11   < > = values in this interval not displayed.    Lipids  Recent Labs  Lab 04/10/2023 2205 05/04/23 0302  CHOL 131  --   TRIG 131 144  HDL 16*  --   LDLCALC 89  --   CHOLHDL 8.2  --     Hematology Recent Labs  Lab 05/04/23 0302 05/04/23 0441 05/05/23 0204 05/06/23 0513  WBC 9.7  --  20.0* 17.9*  RBC 3.64*  --  3.39* 3.08*  HGB 10.8* 10.5* 10.1* 9.1*  HCT  33.2* 31.0* 31.1* 28.4*  MCV 91.2  --  91.7 92.2  MCH 29.7  --  29.8 29.5  MCHC 32.5  --  32.5 32.0  RDW 13.8  --  14.1 14.1  PLT 234  --  248 221   Thyroid  Recent Labs  Lab 05/04/23 1017  TSH 3.847    BNPNo results for input(s): "BNP", "PROBNP" in the last 168 hours.  DDimer No results for input(s): "DDIMER" in the last 168 hours.   Radiology    ECHOCARDIOGRAM COMPLETE  Result Date: 05/04/2023    ECHOCARDIOGRAM REPORT   Patient Name:   Brandy Fisher Date of Exam: 05/04/2023 Medical Rec #:  [id number]        Height:       67.0 in Accession #:    [id number]       Weight:       151.9 lb Date of Birth:  11/17/1955        BSA:          1.799 m Patient Age:    67 years  BP:           84/56 mmHg Patient Gender: F                HR:           94 bpm. Exam Location:  Inpatient Procedure: 2D Echo, Cardiac Doppler, Color Doppler and Intracardiac            Opacification Agent Indications:    Acute ischemic heart disease, unspecified I24.9  History:        Patient has no prior history of Echocardiogram examinations.                 Previous Myocardial Infarction; Risk Factors:Diabetes and                 Current Smoker.  Sonographer:    Lucendia Herrlich RCS Referring Phys: [id number] ROBERT S BYRUM IMPRESSIONS  1. Left ventricular ejection fraction, by estimation, is <20%. The left ventricle has severely decreased function. The left ventricle demonstrates global hypokinesis. Indeterminate diastolic filling due to E-A fusion. There is akinesis of the left ventricular, entire inferior wall. There is severe hypokinesis of the left ventricular, entire inferolateral wall and lateral wall.  2. Right ventricular systolic function is normal. The right ventricular size is normal. There is normal pulmonary artery systolic pressure. The estimated right ventricular systolic pressure is 33.5 mmHg.  3. The mitral valve is normal in structure. Trivial mitral valve regurgitation. No evidence of mitral stenosis.  4.  The aortic valve is tricuspid. Aortic valve regurgitation is not visualized. Aortic valve sclerosis/calcification is present, without any evidence of aortic stenosis. Aortic valve Vmax measures 1.01 m/s.  5. The inferior vena cava is dilated in size with <50% respiratory variability, suggesting right atrial pressure of 15 mmHg. FINDINGS  Left Ventricle: Left ventricular ejection fraction, by estimation, is <20%. The left ventricle has severely decreased function. The left ventricle demonstrates global hypokinesis. Severe hypokinesis of the left ventricular, entire inferolateral wall and  lateral wall. Definity contrast agent was given IV to delineate the left ventricular endocardial borders. The left ventricular internal cavity size was normal in size. There is no left ventricular hypertrophy. Abnormal (paradoxical) septal motion, consistent with left bundle branch block. Indeterminate diastolic filling due to E-A fusion. Normal left ventricular filling pressure. Right Ventricle: The right ventricular size is normal. No increase in right ventricular wall thickness. Right ventricular systolic function is normal. There is normal pulmonary artery systolic pressure. The tricuspid regurgitant velocity is 2.15 m/s, and  with an assumed right atrial pressure of 15 mmHg, the estimated right ventricular systolic pressure is 33.5 mmHg. Left Atrium: Left atrial size was normal in size. Right Atrium: Right atrial size was normal in size. Pericardium: There is no evidence of pericardial effusion. Mitral Valve: The mitral valve is normal in structure. Trivial mitral valve regurgitation. No evidence of mitral valve stenosis. Tricuspid Valve: The tricuspid valve is normal in structure. Tricuspid valve regurgitation is mild . No evidence of tricuspid stenosis. Aortic Valve: The aortic valve is tricuspid. Aortic valve regurgitation is not visualized. Aortic valve sclerosis/calcification is present, without any evidence of aortic  stenosis. Aortic valve peak gradient measures 4.1 mmHg. Pulmonic Valve: The pulmonic valve was normal in structure. Pulmonic valve regurgitation is not visualized. No evidence of pulmonic stenosis. Aorta: The aortic root is normal in size and structure. Venous: The inferior vena cava is dilated in size with less than 50% respiratory variability, suggesting right atrial pressure of 15 mmHg. IAS/Shunts:  No atrial level shunt detected by color flow Doppler.  LEFT VENTRICLE PLAX 2D LVIDd:         4.90 cm   Diastology LVIDs:         4.80 cm   LV e' medial:    4.24 cm/s LV PW:         0.85 cm   LV E/e' medial:  8.2 LV IVS:        0.80 cm   LV e' lateral:   5.55 cm/s LVOT diam:     1.90 cm   LV E/e' lateral: 6.2 LV SV:         23 LV SV Index:   13 LVOT Area:     2.84 cm  RIGHT VENTRICLE            IVC RV S prime:     5.34 cm/s  IVC diam: 2.50 cm TAPSE (M-mode): 0.5 cm LEFT ATRIUM           Index        RIGHT ATRIUM          Index LA diam:      3.20 cm 1.78 cm/m   RA Area:     8.74 cm LA Vol (A2C): 14.5 ml 8.06 ml/m   RA Volume:   18.20 ml 10.12 ml/m LA Vol (A4C): 22.5 ml 12.51 ml/m  AORTIC VALVE AV Area (Vmax): 1.66 cm AV Vmax:        101.00 cm/s AV Peak Grad:   4.1 mmHg LVOT Vmax:      58.97 cm/s LVOT Vmean:     37.633 cm/s LVOT VTI:       0.082 m  AORTA Ao Root diam: 2.70 cm Ao Asc diam:  2.70 cm MITRAL VALVE                TRICUSPID VALVE MV Area (PHT): 4.86 cm     TR Peak grad:   18.5 mmHg MV Decel Time: 156 msec     TR Vmax:        215.00 cm/s MR Peak grad: 49.8 mmHg MR Vmax:      353.00 cm/s   SHUNTS MV E velocity: 34.60 cm/s   Systemic VTI:  0.08 m MV A velocity: 107.00 cm/s  Systemic Diam: 1.90 cm MV E/A ratio:  0.32 Armanda Magic MD Electronically signed by Armanda Magic MD Signature Date/Time: 05/04/2023/12:12:01 PM    Final     Cardiac Studies   ECHO: 05/04/2023  1. Left ventricular ejection fraction, by estimation, is <20%. The left  ventricle has severely decreased function. The left ventricle  demonstrates  global hypokinesis. Indeterminate diastolic filling due to E-A fusion.  There is akinesis of the left  ventricular, entire inferior wall. There is severe hypokinesis of the left  ventricular, entire inferolateral wall and lateral wall.   2. Right ventricular systolic function is normal. The right ventricular  size is normal. There is normal pulmonary artery systolic pressure. The  estimated right ventricular systolic pressure is 33.5 mmHg.   3. The mitral valve is normal in structure. Trivial mitral valve  regurgitation. No evidence of mitral stenosis.   4. The aortic valve is tricuspid. Aortic valve regurgitation is not  visualized. Aortic valve sclerosis/calcification is present, without any  evidence of aortic stenosis. Aortic valve Vmax measures 1.01 m/s.   5. The inferior vena cava is dilated in size with <50% respiratory  variability, suggesting right atrial pressure of 15 mmHg.  Patient Profile     67 y.o. 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 Karmen Stabs.  She has chronic narcotic use pharyngeal squamous cell CA radiation chemotherapy,  Assessment & Plan    Acute myocardial infarction: Patient was found unresponsive at home on 10/28; initial ECG raised concern for inferior posterior STEMI.  Troponins elevated at 6562 increasing to 7214.  Blood pressure is on the low side, currently 101/63.   Rhythm is sinus rhythm with heart rate in the mid 90s.  O2 sat is normal at 98% on ventilator.   Head CT was negative for acute bleed, she now is on heparin infusion.  ASA, and rosuvastatin was started on 10/29.  Will continue to monitor and not plan urgent cath.  She was not taken urgently to the Cath Lab last night.  Hemodynamics  currently stable.  No significant arrhythmia.  Would not take acutely to Cath Lab today since she is past the therapeutic potential window for myocardial salvage with her occlusion several days ago.  However if stabilizes, catheterization can be performed later during admission.  Severe ischemic cardiomyopathy.  Echo Doppler shows EF probably 15%.  There is extensive akinesis of the entire inferior and lateral wall.  Estimated RV systolic pressure 33 mm.  No obvious mural thrombus.  Patient is on heparin and without heparin would be at high risk for apical thrombus. Acute respiratory failure: Now intubated followed by pulmonary critical care.  Blood pressure 101/63.  With low BP currently not a candidate to initiate GDMT but consider postextubation if blood pressure allows. On a ventilator wean today. Acute anoxic encephalopathy: Patient has history of narcotic use and chronic pain. 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 History of tobacco use Type 2 diabetes mellitus on sliding scale insulin Anemia: H/H 9.1/28.4 today. Lipid therapy: Rosuvastatin started yesterday 20 mg.  LDL 89 on 05/01/2023.  For questions or updates, please contact [facility] Please consult [email or web address] for contact info under    Signed, Nicki Guadalajara, MD  05/06/2023, 8:40 AM

## 2023-05-06 NOTE — Progress Notes (Signed)
NAME:  Brandy Fisher, MRN:  [id number], DOB:  08-07-1955, LOS: 3 ADMISSION DATE:  06/04/2023,  CHIEF COMPLAINT: Acute encephalopathy, respiratory failure  History of Present Illness:  67 year old woman with a history of tobacco use, insulin-dependent diabetes, stage IVa pharyngeal squamous cell carcinoma treated with radiation and concomitant chemotherapy 2020 (on observation), hypothyroidism, chronic narcotic use.  She was found unresponsive around 2100 by family after last being seen normal around 1200 at which time she was laying down for a nap.  Shallow respirations noted.  Unresponsive to Narcan.  ECG performed that showed inferoposterior ST elevations.  She was intubated for airway protection in the ED and has been seen by interventional cardiology.  No urgent cardiac catheterization is planned, aspirin, heparin ordered pending head CT results.  Troponin 6562.  Chest x-ray reviewed by me, clear without any infiltrates, ET tube in good position.   Pertinent  Medical History   Past Medical History:  Diagnosis Date   Arthritis    Cancer (HCC)    Throat cancer   Cough    GREEN SPUTUM    Diabetes mellitus    Type II   History of kidney stones    passed   Osteomyelitis of foot, left, acute (HCC) 11/09/2013    Significant Hospital Events: Including procedures, antibiotic start and stop dates in addition to other pertinent events   05/25/2023 admitted w/ ams and intubated for airway protection 10/31 remains intubated, tolerating SBT this morning  Interim History / Subjective:  No acute events overnight  Objective   Blood pressure 95/60, pulse 92, temperature 98.4 F (36.9 C), temperature source Oral, resp. rate 19, height 5\' 7"  (1.702 m), weight 68.5 kg, SpO2 97%.    Vent Mode: PSV;CPAP FiO2 (%):  [40 %] 40 % Set Rate:  [16 bmp] 16 bmp Vt Set:  [490 mL] 490 mL PEEP:  [5 cmH20] 5 cmH20 Pressure Support:  [10 cmH20] 10 cmH20 Plateau Pressure:  [11 cmH20-16 cmH20] 13 cmH20    Intake/Output Summary (Last 24 hours) at 05/06/2023 0944 Last data filed at 05/06/2023 0800 Gross per 24 hour  Intake 1801.95 ml  Output 1340 ml  Net 461.95 ml   Filed Weights   05/04/23 0500 05/05/23 0500 05/06/23 0302  Weight: 68.9 kg 68.3 kg 68.5 kg    Examination: General: Acute on chronic ill-appearing middle-aged female lying in bed on mechanical ventilation in no acute distress HEENT: ETT, MM pink/moist, PERRL,  Neuro: Slightly sedated but able to follow commands on vent CV: s1s2 regular rate and rhythm, no murmur, rubs, or gallops,  PULM: Clear to auscultation bilaterally, no increased work of breathing, no added breath sounds GI: soft, bowel sounds active in all 4 quadrants, non-tender, non-distended, tolerating TF Extremities: warm/dry, no edema  Skin: no rashes or lesions   Resolved Hospital Problem list     Assessment & Plan:   Acute encephalopathy, unclear etiology.   -Given history of narcotics/benzo use and chronic pain consider toxic ingestion; she did not respond to Narcan.  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 Head of bed elevated 30 degrees. Plateau pressures less than 30 cm H20.  Follow intermittent chest x-ray and ABG.   SAT/SBT as tolerated, mentation preclude extubation  Ensure adequate pulmonary hygiene  Follow cultures  VAP bundle in place  Continue empiric ceftriaxone PAD protocol  ACS with  apparent STEMI Severe ischemic cardiomyopathy:  -lvef <20% on echo 10/29 P: Cardiology following, appreciate assistance Continuous telemetry Strict intake and output Pending cardiac catheterization per cardiology Continue medical management GDMT as able Continue aspirin and statin  Chronic pain with narcotics use History of anxiety P: Home medications remain on hold  DMT2 -A1c 7.2 P: Continue SSI May need to increase if she remains hyperglycemic postextubation CBG goal 140-180 CBG checks every 4  Hx of subclinical hypothyroidism -tsh wnl P: Continue home Synthroid  Anemia P: Trend CBC Hemoglobin goal greater than 7 Transfuse per protocol  History of pharyngeal cancer on observation P: Supportive care Outpatient oncology follow-up  At risk malnutrition P: Continue tube feeds per RD  Best Practice (right click and "Reselect all SmartList Selections" daily)   Diet/type: tubefeeds DVT prophylaxis: systemic heparin GI prophylaxis: H2B Lines: N/A Foley:  Yes, and it is still needed Code Status:  full code Last date of multidisciplinary goals of care discussion [10/30 family updated at bedside on plan of care]  Critical care time:   CRITICAL CARE Performed by: Jovan Colligan D. Harris  Total critical care time: 35 minutes  Critical care time was exclusive of separately billable procedures and treating other patients.  Critical care was necessary to treat or prevent imminent or life-threatening deterioration.  Critical care was time spent personally by me on the following activities: development of treatment plan with patient and/or surrogate as well as nursing, discussions with consultants, evaluation of patient's response to treatment, examination of patient, obtaining history from patient or surrogate, ordering and performing treatments and interventions, ordering and review of laboratory studies, ordering and review of radiographic studies, pulse oximetry and  re-evaluation of patient's condition.  Jibreel Fedewa D. Harris, NP-C [facility] Personal contact information can be found on Amion  If no contact or response made please call [phone] 05/06/2023, 9:51 AM

## 2023-05-06 NOTE — IPAL (Signed)
  Interdisciplinary Goals of Care Family Meeting   Date carried out: 05/06/2023  Location of the meeting: Conference room  Member's involved: Nurse Practitioner, Bedside Registered Nurse, and Family Member or next of kin  Durable Power of Attorney or acting medical decision maker: Shared among family with ultimate decision maker being Brandy Fisher, son  Discussion: Brandy Fisher continues to display signs of respiratory distress post extubation with dependence on BiPAP currently with decreased mentation. Goals of care discussion held with family due to possible need for reintubation.  All family members report Brandy Fisher in the past that she would not want to be dependent on life support and would not want long-term support such as tracheostomy tube.  This coupled with significant acute and chronic comorbidities including new onset severe HFrEF in the setting of STEMI and acute Streptococcus pneumonia decision was made to transition to comfort interventions once all family has had a chance to visit.  At this time continue current interventions including BiPAP to allow time for family to be with patient.  Once family has visited will plan to transition to comfort interventions including removal of BiPAP and initiation of morphine drip.  Code status: Full DNR  Disposition: Continue current interventions with plan to transition to inpatient hospice care  Time spent for the meeting: 40 min  Ceriah Kohler D. Harris 05/06/2023, 4:42 PM

## 2023-05-06 NOTE — Progress Notes (Signed)
PHARMACY - ANTICOAGULATION CONSULT NOTE  Pharmacy Consult for heparin Indication:  ACS/STEMI  Allergies  Allergen Reactions   Morphine And Codeine Nausea And Vomiting   Tramadol Nausea Only    Patient Measurements: Height: 5\' 7"  (170.2 cm) Weight: 68.5 kg (151 lb 0.2 oz) IBW/kg (Calculated) : 61.6  Vital Signs: Temp: 98.1 F (36.7 C) (10/31 0327) Temp Source: Axillary (10/31 0327) BP: 108/66 (10/31 0321) Pulse Rate: 92 (10/31 0100)  Labs: Recent Labs    05/03/23 2205 05/03/23 2342 05/04/23 0302 05/04/23 0441 05/04/23 1100 05/05/23 0204 05/05/23 0948 05/05/23 1035 05/05/23 1834 05/06/23 0230  HGB 11.2*   < > 10.8* 10.5*  --  10.1*  --   --   --   --   HCT 33.6*   < > 33.2* 31.0*  --  31.1*  --   --   --   --   PLT 252  --  234  --   --  248  --   --   --   --   APTT 22*  --   --   --   --   --   --   --   --   --   LABPROT 15.7*  --   --   --   --   --   --   --   --   --   INR 1.2  --   --   --   --   --   --   --   --   --   HEPARINUNFRC  --   --   --   --    < >  --  0.21*  --  0.29* 0.28*  CREATININE 0.91  --  0.87  --   --   --   --  0.86  --   --   TROPONINIHS 6,562*  --  7,214*  --   --   --   --   --   --   --    < > = values in this interval not displayed.    Estimated Creatinine Clearance: 60 mL/min (by C-G formula based on SCr of 0.86 mg/dL).   Medical History: Past Medical History:  Diagnosis Date   Arthritis    Cancer (HCC)    Throat cancer   Cough    GREEN SPUTUM    Diabetes mellitus    Type II   History of kidney stones    passed   Osteomyelitis of foot, left, acute (HCC) 11/09/2013    Medications:  Medications Prior to Admission  Medication Sig Dispense Refill Last Dose   albuterol (PROVENTIL HFA;VENTOLIN HFA) 108 (90 Base) MCG/ACT inhaler Inhale 2 puffs into the lungs every 6 (six) hours as needed for wheezing or shortness of breath.    Past Week   clonazePAM (KLONOPIN) 1 MG tablet Take 1 mg by mouth at bedtime.   Past Week    cyclobenzaprine (FLEXERIL) 10 MG tablet Take 10 mg by mouth 3 (three) times daily as needed for muscle spasms.   Past Week   levothyroxine (SYNTHROID) 50 MCG tablet Take 50 mcg by mouth daily before breakfast.   Past Week   terbinafine (LAMISIL) 1 % cream Apply 1 Application topically 2 (two) times daily. 30 g 0 Past Week   Vitamin D, Ergocalciferol, (DRISDOL) 1.25 MG (50000 UNIT) CAPS capsule Take 1 capsule (50,000 Units total) by mouth every 7 (seven) days. 20 capsule 1 Past Week  HYDROcodone-acetaminophen (NORCO) 7.5-325 MG tablet Take 1 tablet by mouth every 6 (six) hours as needed (pain.). (Patient not taking: Reported on 05/04/2023) 20 tablet 0 Not Taking   metFORMIN (GLUCOPHAGE) 1000 MG tablet Take 1 tablet (1,000 mg total) by mouth daily. (Patient not taking: Reported on 05/04/2023) 90 tablet 1 Not Taking   Scheduled:   aspirin  81 mg Per Tube Daily   Chlorhexidine Gluconate Cloth  6 each Topical Q0600   docusate  100 mg Per Tube BID   famotidine  20 mg Per Tube BID   insulin aspart  0-15 Units Subcutaneous Q4H   levothyroxine  50 mcg Per Tube QAC breakfast   mouth rinse  15 mL Mouth Rinse Q2H   polyethylene glycol  17 g Per Tube Daily   rosuvastatin  20 mg Per Tube Daily   sodium chloride flush  10-40 mL Intracatheter Q12H   Infusions:   cefTRIAXone (ROCEPHIN)  IV Stopped (05/05/23 1026)   feeding supplement (VITAL AF 1.2 CAL) 60 mL/hr at 05/06/23 0000   fentaNYL infusion INTRAVENOUS Stopped (05/04/23 1100)   heparin 1,500 Units/hr (05/06/23 0000)    Assessment: 67yo female was found unresponsive at home, EKG showed STEMI. Pharmacy dosing heparin  Heparin level just slightly subtherapeutic (0.29) on infusion at 1400 units/hr. No issues with line or bleeding reported per RN.  10/31 AM update: Heparin level sub-therapeutic    Goal of Therapy:  Heparin level 0.3-0.7 units/ml Monitor platelets by anticoagulation protocol: Yes   Plan:   Increase heparin infusion to 1600  units/hr. Re-check heparin level in 6-8 hours  Abran Duke, PharmD, BCPS Clinical Pharmacist Phone: [phone]

## 2023-05-06 NOTE — Procedures (Signed)
PCCM progress note  Patient extubated this a.m. and initially tolerated procedure well but shortly after extubation patient began showing signs of respiratory distress with increased accessory muscle use and tachycardia.  Placed on BiPAP with improvement in respiratory drive.  Patient's son was contacted I encouraged family to discuss ongoing goals of care as given potential for need of reintubation.  At this time feeling like to continue aggressive interventions including reintubation if needed.  Brandy Fogg D. Harris, NP-C [facility] Personal contact information can be found on Amion  If no contact or response made please call [phone] 05/06/2023, 10:39 AM

## 2023-05-07 DEATH — deceased

## 2023-05-10 LAB — CULTURE, BLOOD (ROUTINE X 2)
Culture: NO GROWTH
Culture: NO GROWTH

## 2023-06-06 NOTE — Death Summary Note (Signed)
DEATH SUMMARY   Patient Details  Name: Brandy Fisher MRN: [id number] DOB: [birth year]  Admission/Discharge Information   Admit Date:  21-May-2023  Date of Death: Date of Death: 25-May-2023  Time of Death: Time of Death: 0411  Length of Stay: 4  Referring Physician: Gilmore Laroche, FNP   Reason(s) for Hospitalization  Acute inferior/lateral STEMI Acute HFrEF due to ischemic cardiomyopathy Acute toxic encephalopathy Acute respiratory failure due to hypoxia Bilateral bilateral multifocal pneumococcal pneumonia Chronic pain syndrome Diabetes type 2 with hyperglycemia History of pharyngeal squamous cell cancer s/p chemoradiation Moderate malnutrition  Diagnoses  Preliminary cause of death: Withdrawal of care in the setting of multisystem organ failure, placed on comfort care Secondary Diagnoses (including complications and co-morbidities):  Principal Problem:   Acute encephalopathy Active Problems:   Heart attack (HCC)   Pressure injury of skin   Acute respiratory failure (HCC)   Malnutrition of moderate degree   Altered mental status   Brief Hospital Course (including significant findings, care, treatment, and services provided and events leading to death)  Brandy Fisher is a 67 y.o. 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 She was found unresponsive around 06/04/99 by family after last being seen normal around 1200 at which time she was laying down for a nap. Shallow respirations noted. Unresponsive to Narcan. ECG performed that showed inferoposterior ST elevations. She was intubated for airway protection in the ED and has been seen by interventional cardiology. No urgent cardiac catheterization is planned, aspirin, heparin ordered pending head CT results. Troponin 6562   Patient was started  on aspirin, statin and IV heparin infusion, echocardiogram was done which showed EF less than 20% with diffuse wall motion abnormalities, cardiology recommend continuing medical management and cardiac cath at some point, due to ischemic cardiomyopathy she developed acute HFrEF.  Patient mental status started improving, though her urine tox was negative, there was suspected she had toxic encephalopathy which improved over next few days she remained on mechanical ventilator, respiratory culture with pneumococcal pneumonia she was continued on IV antibiotics with ceftriaxone, home pain and anxiety medications were kept on hold due to encephalopathy.  She was continued on tube feeds and insulin patient was extubated on 05/06/2023, initially she did well but then she started struggling, patient's family suggested that she would not want to have aggressive care in that situation considering she need to go back on ventilator and possible tracheostomy.  They decided to proceed with comfort focused care and patient passed on 05-25-2023 at 4:11 AM.  Patient's family was informed  Pertinent Labs and Studies  Significant Diagnostic Studies ECHOCARDIOGRAM COMPLETE  Result Date: 05/04/2023    ECHOCARDIOGRAM REPORT   Patient Name:   Brandy Fisher Date of Exam: 05/04/2023 Medical Rec #:  [id number]        Height:       67.0 in Accession #:    [id number]       Weight:       151.9 lb Date of Birth:  1955-09-14        BSA:          1.799 m Patient Age:    67 years         BP:           84/56 mmHg Patient Gender: F  HR:           94 bpm. Exam Location:  Inpatient Procedure: 2D Echo, Cardiac Doppler, Color Doppler and Intracardiac            Opacification Agent Indications:    Acute ischemic heart disease, unspecified I24.9  History:        Patient has no prior history of Echocardiogram examinations.                 Previous Myocardial Infarction; Risk Factors:Diabetes and                 Current Smoker.   Sonographer:    Lucendia Herrlich RCS Referring Phys: [id number] ROBERT S BYRUM IMPRESSIONS  1. Left ventricular ejection fraction, by estimation, is <20%. The left ventricle has severely decreased function. The left ventricle demonstrates global hypokinesis. Indeterminate diastolic filling due to E-A fusion. There is akinesis of the left ventricular, entire inferior wall. There is severe hypokinesis of the left ventricular, entire inferolateral wall and lateral wall.  2. Right ventricular systolic function is normal. The right ventricular size is normal. There is normal pulmonary artery systolic pressure. The estimated right ventricular systolic pressure is 33.5 mmHg.  3. The mitral valve is normal in structure. Trivial mitral valve regurgitation. No evidence of mitral stenosis.  4. The aortic valve is tricuspid. Aortic valve regurgitation is not visualized. Aortic valve sclerosis/calcification is present, without any evidence of aortic stenosis. Aortic valve Vmax measures 1.01 m/s.  5. The inferior vena cava is dilated in size with <50% respiratory variability, suggesting right atrial pressure of 15 mmHg. FINDINGS  Left Ventricle: Left ventricular ejection fraction, by estimation, is <20%. The left ventricle has severely decreased function. The left ventricle demonstrates global hypokinesis. Severe hypokinesis of the left ventricular, entire inferolateral wall and  lateral wall. Definity contrast agent was given IV to delineate the left ventricular endocardial borders. The left ventricular internal cavity size was normal in size. There is no left ventricular hypertrophy. Abnormal (paradoxical) septal motion, consistent with left bundle branch block. Indeterminate diastolic filling due to E-A fusion. Normal left ventricular filling pressure. Right Ventricle: The right ventricular size is normal. No increase in right ventricular wall thickness. Right ventricular systolic function is normal. There is normal pulmonary artery  systolic pressure. The tricuspid regurgitant velocity is 2.15 m/s, and  with an assumed right atrial pressure of 15 mmHg, the estimated right ventricular systolic pressure is 33.5 mmHg. Left Atrium: Left atrial size was normal in size. Right Atrium: Right atrial size was normal in size. Pericardium: There is no evidence of pericardial effusion. Mitral Valve: The mitral valve is normal in structure. Trivial mitral valve regurgitation. No evidence of mitral valve stenosis. Tricuspid Valve: The tricuspid valve is normal in structure. Tricuspid valve regurgitation is mild . No evidence of tricuspid stenosis. Aortic Valve: The aortic valve is tricuspid. Aortic valve regurgitation is not visualized. Aortic valve sclerosis/calcification is present, without any evidence of aortic stenosis. Aortic valve peak gradient measures 4.1 mmHg. Pulmonic Valve: The pulmonic valve was normal in structure. Pulmonic valve regurgitation is not visualized. No evidence of pulmonic stenosis. Aorta: The aortic root is normal in size and structure. Venous: The inferior vena cava is dilated in size with less than 50% respiratory variability, suggesting right atrial pressure of 15 mmHg. IAS/Shunts: No atrial level shunt detected by color flow Doppler.  LEFT VENTRICLE PLAX 2D LVIDd:         4.90 cm   Diastology LVIDs:  4.80 cm   LV e' medial:    4.24 cm/s LV PW:         0.85 cm   LV E/e' medial:  8.2 LV IVS:        0.80 cm   LV e' lateral:   5.55 cm/s LVOT diam:     1.90 cm   LV E/e' lateral: 6.2 LV SV:         23 LV SV Index:   13 LVOT Area:     2.84 cm  RIGHT VENTRICLE            IVC RV S prime:     5.34 cm/s  IVC diam: 2.50 cm TAPSE (M-mode): 0.5 cm LEFT ATRIUM           Index        RIGHT ATRIUM          Index LA diam:      3.20 cm 1.78 cm/m   RA Area:     8.74 cm LA Vol (A2C): 14.5 ml 8.06 ml/m   RA Volume:   18.20 ml 10.12 ml/m LA Vol (A4C): 22.5 ml 12.51 ml/m  AORTIC VALVE AV Area (Vmax): 1.66 cm AV Vmax:        101.00 cm/s  AV Peak Grad:   4.1 mmHg LVOT Vmax:      58.97 cm/s LVOT Vmean:     37.633 cm/s LVOT VTI:       0.082 m  AORTA Ao Root diam: 2.70 cm Ao Asc diam:  2.70 cm MITRAL VALVE                TRICUSPID VALVE MV Area (PHT): 4.86 cm     TR Peak grad:   18.5 mmHg MV Decel Time: 156 msec     TR Vmax:        215.00 cm/s MR Peak grad: 49.8 mmHg MR Vmax:      353.00 cm/s   SHUNTS MV E velocity: 34.60 cm/s   Systemic VTI:  0.08 m MV A velocity: 107.00 cm/s  Systemic Diam: 1.90 cm MV E/A ratio:  0.32 Armanda Magic MD Electronically signed by Armanda Magic MD Signature Date/Time: 05/04/2023/12:12:01 PM    Final    CT Head Wo Contrast  Result Date: 05/04/2023 CLINICAL DATA:  Headache altered mental status EXAM: CT HEAD WITHOUT CONTRAST TECHNIQUE: Contiguous axial images were obtained from the base of the skull through the vertex without intravenous contrast. RADIATION DOSE REDUCTION: This exam was performed according to the departmental dose-optimization program which includes automated exposure control, adjustment of the mA and/or kV according to patient size and/or use of iterative reconstruction technique. COMPARISON:  MRI report 05/10/2019, 07/30/2020 FINDINGS: Brain: No acute territorial infarction or hemorrhage is visualized. 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 Atrophy and mild chronic small vessel ischemic changes of the white matter. Nonenlarged ventricles Vascular: No hyperdense vessels.  Carotid vascular calcification Skull: Normal. Negative for fracture or focal lesion. Sinuses/Orbits: No acute finding. Other: None IMPRESSION: 1. No CT evidence for acute intracranial abnormality. 2. Atrophy and mild chronic small vessel ischemic changes of the white matter. 3. Stable 2.1 cm densely calcified extra-axial mass in the right middle cranial fossa. Electronically Signed   By: Jasmine Pang M.D.   On: 05/04/2023 01:16   DG Abdomen 1  View  Result Date: 05/04/2023 CLINICAL DATA:  NG tube EXAM: ABDOMEN -  1 VIEW COMPARISON:  10/03/2018 FINDINGS: Esophageal tube tip at the mid stomach.  Nonobstructed gas pattern IMPRESSION: Esophageal tube tip at the mid stomach. Electronically Signed   By: Jasmine Pang M.D.   On: 05/04/2023 01:12   DG Chest Portable 1 View  Result Date: 05/04/2023 CLINICAL DATA:  Intubated EXAM: PORTABLE CHEST 1 VIEW COMPARISON:  05/09/2015, PET CT 07/10/2019 FINDINGS: Right-sided central venous port tip over the SVC. Endotracheal tube tip about 3 cm superior to carina though carina is suboptimally visualized. Esophageal tube tip below the diaphragm but incompletely included. No acute airspace disease or effusion. Normal cardiac size. Aortic atherosclerosis. IMPRESSION: 1. Endotracheal tube tip about 3 cm superior to carina, note that carina somewhat difficult to visualize on this exam. Esophageal tube tip below the diaphragm but incompletely included. 2. No acute airspace disease. Electronically Signed   By: Jasmine Pang M.D.   On: 05/04/2023 01:11    Microbiology Recent Results (from the past 240 hour(s))  MRSA Next Gen by PCR, Nasal     Status: None   Collection Time: 05/04/23 12:22 AM   Specimen: Nasal Mucosa; Nasal Swab  Result Value Ref Range Status   MRSA by PCR Next Gen NOT DETECTED NOT DETECTED Final    Comment: (NOTE) The GeneXpert MRSA Assay (FDA approved for NASAL specimens only), is one component of a comprehensive MRSA colonization surveillance program. It is not intended to diagnose MRSA infection nor to guide or monitor treatment for MRSA infections. Test performance is not FDA approved in patients less than 10 years old. Performed at [facility], [address], [place], [place] [address]   Culture, Respiratory w Gram Stain     Status: None   Collection Time: 05/04/23 11:52 AM   Specimen: Tracheal Aspirate; Respiratory  Result Value Ref Range Status   Specimen Description  TRACHEAL ASPIRATE  Final   Special Requests NONE  Final   Gram Stain   Final    ABUNDANT WBC PRESENT, PREDOMINANTLY PMN MODERATE GRAM POSITIVE COCCI IN PAIRS FEW GRAM NEGATIVE RODS Performed at [facility], [address], [place], [place] [address]    Culture MODERATE STREPTOCOCCUS PNEUMONIAE  Final   Report Status 05/06/2023 FINAL  Final   Organism ID, Bacteria STREPTOCOCCUS PNEUMONIAE  Final      Susceptibility   Streptococcus pneumoniae - MIC*    ERYTHROMYCIN <=0.12 SENSITIVE Sensitive     LEVOFLOXACIN <=0.25 SENSITIVE Sensitive     VANCOMYCIN <=0.12 SENSITIVE Sensitive     PENICILLIN (meningitis) <=0.06 SENSITIVE Sensitive     PENO - penicillin <=0.06      PENICILLIN (non-meningitis) <=0.06 SENSITIVE Sensitive     PENICILLIN (oral) <=0.06 SENSITIVE Sensitive     CEFTRIAXONE (non-meningitis) <=0.12 SENSITIVE Sensitive     CEFTRIAXONE (meningitis) <=0.12 SENSITIVE Sensitive     * MODERATE STREPTOCOCCUS PNEUMONIAE  Culture, blood (Routine X 2) w Reflex to ID Panel     Status: None (Preliminary result)   Collection Time: 05/05/23  9:47 AM   Specimen: BLOOD RIGHT HAND  Result Value Ref Range Status   Specimen Description BLOOD RIGHT HAND  Final   Special Requests   Final    BOTTLES DRAWN AEROBIC ONLY Blood Culture results may not be optimal due to an inadequate volume of blood received in culture bottles   Culture   Final    NO GROWTH 2 DAYS Performed at [facility], [address] [place], [place] [address]    Report  Status PENDING  Incomplete  Culture, blood (Routine X 2) w Reflex to ID Panel     Status: None (Preliminary result)   Collection Time: 05/05/23  9:48 AM   Specimen: BLOOD LEFT HAND  Result Value Ref Range Status   Specimen Description BLOOD LEFT HAND  Final   Special Requests   Final    BOTTLES DRAWN AEROBIC AND ANAEROBIC Blood Culture results may not be optimal due to an inadequate volume of blood received in culture bottles   Culture   Final     NO GROWTH 2 DAYS Performed at [facility], [address] [place], [place] [address]    Report Status PENDING  Incomplete    Lab Basic Metabolic Panel: Recent Labs  Lab 04/25/2023 2205 04/22/2023 2342 05/04/23 0302 05/04/23 0441 05/04/23 1545 05/05/23 0203 05/05/23 1035 05/05/23 1645 05/06/23 0513  NA 134* 133* 134* 135  --   --  135  --  133*  K 4.4 4.0 3.8 3.8  --   --  3.5  --  3.5  CL 99  --  99  --   --   --  100  --  96*  CO2 27  --  25  --   --   --  25  --  26  GLUCOSE 205*  --  160*  --   --   --  174*  --  278*  BUN 39*  --  37*  --   --   --  41*  --  42*  CREATININE 0.91  --  0.87  --   --   --  0.86  --  0.74  CALCIUM 8.1*  --  8.2*  --   --   --  7.9*  --  8.0*  MG  --   --  2.4  --  2.2 2.2  --  1.9 1.9  PHOS  --   --  3.2  --  3.0 2.6  --  2.6 2.8   Liver Function Tests: Recent Labs  Lab 05/01/2023 2205  AST 32  ALT 44  ALKPHOS 85  BILITOT 0.7  PROT 6.5  ALBUMIN 2.7*   No results for input(s): "LIPASE", "AMYLASE" in the last 168 hours. Recent Labs  Lab 05/04/23 1100  AMMONIA 16   CBC: Recent Labs  Lab 04/14/2023 2205 04/24/2023 2342 05/04/23 0302 05/04/23 0441 05/05/23 0204 05/06/23 0513  WBC 8.5  --  9.7  --  20.0* 17.9*  NEUTROABS 5.6  --   --   --   --   --   HGB 11.2* 10.9* 10.8* 10.5* 10.1* 9.1*  HCT 33.6* 32.0* 33.2* 31.0* 31.1* 28.4*  MCV 91.8  --  91.2  --  91.7 92.2  PLT 252  --  234  --  248 221   Cardiac Enzymes: No results for input(s): "CKTOTAL", "CKMB", "CKMBINDEX", "TROPONINI" in the last 168 hours. Sepsis Labs: Recent Labs  Lab 04/13/2023 2205 05/05/2023 2218 05/04/23 0302 05/05/23 0204 05/06/23 0513  WBC 8.5  --  9.7 20.0* 17.9*  LATICACIDVEN  --  1.5  --   --   --     Procedures/Operations    Tonye Tancredi 05/11/2023, 11:35 AM

## 2023-06-06 NOTE — Accreditation Note (Signed)
Restraint Death after removal of soft bilateral wrist restraints logged 05/11/2023 at 0805 by Laurene Footman RN BSN.

## 2023-06-06 DEATH — deceased

## 2023-07-14 ENCOUNTER — Ambulatory Visit: Payer: 59 | Admitting: Family Medicine

## 2023-07-30 ENCOUNTER — Ambulatory Visit: Payer: 59 | Admitting: Family Medicine
